# Patient Record
Sex: Female | Born: 1965 | Race: Black or African American | Hispanic: No | Marital: Single
Health system: Midwestern US, Community
[De-identification: ages and names within clinical notes are randomized; demographics above are authoritative.]

## PROBLEM LIST (undated history)

## (undated) DIAGNOSIS — J302 Other seasonal allergic rhinitis: Secondary | ICD-10-CM

## (undated) DIAGNOSIS — R7303 Prediabetes: Secondary | ICD-10-CM

## (undated) DIAGNOSIS — I10 Essential (primary) hypertension: Secondary | ICD-10-CM

## (undated) DIAGNOSIS — F419 Anxiety disorder, unspecified: Secondary | ICD-10-CM

## (undated) DIAGNOSIS — J45909 Unspecified asthma, uncomplicated: Secondary | ICD-10-CM

## (undated) DIAGNOSIS — K2970 Gastritis, unspecified, without bleeding: Secondary | ICD-10-CM

## (undated) DIAGNOSIS — K222 Esophageal obstruction: Secondary | ICD-10-CM

## (undated) DIAGNOSIS — K2270 Barrett's esophagus without dysplasia: Secondary | ICD-10-CM

## (undated) DIAGNOSIS — K859 Acute pancreatitis without necrosis or infection, unspecified: Secondary | ICD-10-CM

## (undated) DIAGNOSIS — Z9289 Personal history of other medical treatment: Secondary | ICD-10-CM

## (undated) DIAGNOSIS — C539 Malignant neoplasm of cervix uteri, unspecified: Secondary | ICD-10-CM

## (undated) DIAGNOSIS — M170 Bilateral primary osteoarthritis of knee: Secondary | ICD-10-CM

## (undated) DIAGNOSIS — G43909 Migraine, unspecified, not intractable, without status migrainosus: Secondary | ICD-10-CM

## (undated) DIAGNOSIS — F32A Depression, unspecified: Secondary | ICD-10-CM

## (undated) DIAGNOSIS — K449 Diaphragmatic hernia without obstruction or gangrene: Secondary | ICD-10-CM

## (undated) DIAGNOSIS — K209 Esophagitis, unspecified without bleeding: Secondary | ICD-10-CM

## (undated) DIAGNOSIS — K219 Gastro-esophageal reflux disease without esophagitis: Secondary | ICD-10-CM

## (undated) DIAGNOSIS — Z87442 Personal history of urinary calculi: Secondary | ICD-10-CM

## (undated) DIAGNOSIS — J189 Pneumonia, unspecified organism: Secondary | ICD-10-CM

## (undated) DIAGNOSIS — I201 Angina pectoris with documented spasm: Secondary | ICD-10-CM

## (undated) DIAGNOSIS — G4730 Sleep apnea, unspecified: Secondary | ICD-10-CM

## (undated) DIAGNOSIS — J42 Unspecified chronic bronchitis: Secondary | ICD-10-CM

## (undated) DIAGNOSIS — F329 Major depressive disorder, single episode, unspecified: Secondary | ICD-10-CM

## (undated) HISTORY — PX: NASAL SEPTUM SURGERY: SHX37

## (undated) HISTORY — PX: CYSTOSCOPY W/ STONE MANIPULATION: SHX1427

## (undated) HISTORY — DX: Unspecified asthma, uncomplicated: J45.909

## (undated) HISTORY — PX: ENDOMETRIAL ABLATION: SHX621

## (undated) HISTORY — PX: KNEE ARTHROSCOPY: SUR90

## (undated) HISTORY — PX: TUBAL LIGATION: SHX77

## (undated) HISTORY — DX: Barrett's esophagus without dysplasia: K22.70

## (undated) HISTORY — DX: Gastritis, unspecified, without bleeding: K29.70

## (undated) HISTORY — DX: Depression, unspecified: F32.A

## (undated) HISTORY — PX: DILATION AND CURETTAGE OF UTERUS: SHX78

## (undated) HISTORY — DX: Essential (primary) hypertension: I10

## (undated) HISTORY — PX: WISDOM TOOTH EXTRACTION: SHX21

## (undated) HISTORY — PX: UPPER GASTROINTESTINAL ENDOSCOPY: SHX188

## (undated) HISTORY — PX: HERNIA REPAIR: SHX51

## (undated) HISTORY — PX: APPENDECTOMY: SHX54

## (undated) HISTORY — PX: COLONOSCOPY: SHX174

## (undated) HISTORY — DX: Esophageal obstruction: K22.2

## (undated) HISTORY — PX: CARDIAC CATHETERIZATION: SHX172

## (undated) HISTORY — PX: VAGINAL HYSTERECTOMY: SUR661

## (undated) HISTORY — DX: Esophagitis, unspecified without bleeding: K20.90

## (undated) HISTORY — PX: ESOPHAGOGASTRODUODENOSCOPY (EGD) WITH ESOPHAGEAL DILATION: SHX5812

## (undated) HISTORY — DX: Gastro-esophageal reflux disease without esophagitis: K21.9

## (undated) HISTORY — PX: OTHER SURGICAL HISTORY: SHX169

## (undated) HISTORY — DX: Diaphragmatic hernia without obstruction or gangrene: K44.9

## (undated) HISTORY — DX: Major depressive disorder, single episode, unspecified: F32.9

## (undated) HISTORY — DX: Esophagitis, unspecified: K20.9

## (undated) HISTORY — PX: JOINT REPLACEMENT: SHX530

---

## 2002-04-04 HISTORY — PX: TONSILLECTOMY: SUR1361

## 2007-10-10 ENCOUNTER — Emergency Department (HOSPITAL_COMMUNITY): Admission: EM | Admit: 2007-10-10 | Discharge: 2007-10-10 | Payer: Self-pay | Admitting: Emergency Medicine

## 2007-10-10 ENCOUNTER — Encounter (INDEPENDENT_AMBULATORY_CARE_PROVIDER_SITE_OTHER): Payer: Self-pay | Admitting: *Deleted

## 2007-11-24 ENCOUNTER — Emergency Department (HOSPITAL_COMMUNITY): Admission: EM | Admit: 2007-11-24 | Discharge: 2007-11-25 | Payer: Self-pay | Admitting: Emergency Medicine

## 2007-11-25 ENCOUNTER — Encounter: Payer: Self-pay | Admitting: Internal Medicine

## 2007-11-26 ENCOUNTER — Telehealth: Payer: Self-pay | Admitting: Internal Medicine

## 2007-11-26 ENCOUNTER — Ambulatory Visit: Payer: Self-pay | Admitting: Internal Medicine

## 2007-11-26 DIAGNOSIS — R1310 Dysphagia, unspecified: Secondary | ICD-10-CM | POA: Insufficient documentation

## 2007-11-27 ENCOUNTER — Ambulatory Visit: Payer: Self-pay | Admitting: Internal Medicine

## 2007-11-27 ENCOUNTER — Encounter: Payer: Self-pay | Admitting: Internal Medicine

## 2007-11-29 ENCOUNTER — Encounter: Payer: Self-pay | Admitting: Internal Medicine

## 2008-01-09 DIAGNOSIS — Z8719 Personal history of other diseases of the digestive system: Secondary | ICD-10-CM | POA: Insufficient documentation

## 2008-01-09 DIAGNOSIS — Z87442 Personal history of urinary calculi: Secondary | ICD-10-CM | POA: Insufficient documentation

## 2008-01-09 DIAGNOSIS — I201 Angina pectoris with documented spasm: Secondary | ICD-10-CM | POA: Insufficient documentation

## 2008-01-09 DIAGNOSIS — I10 Essential (primary) hypertension: Secondary | ICD-10-CM | POA: Insufficient documentation

## 2008-01-09 DIAGNOSIS — K2270 Barrett's esophagus without dysplasia: Secondary | ICD-10-CM | POA: Insufficient documentation

## 2008-01-09 DIAGNOSIS — J45909 Unspecified asthma, uncomplicated: Secondary | ICD-10-CM | POA: Insufficient documentation

## 2008-03-17 ENCOUNTER — Emergency Department (HOSPITAL_COMMUNITY): Admission: EM | Admit: 2008-03-17 | Discharge: 2008-03-17 | Payer: Self-pay | Admitting: Family Medicine

## 2008-05-13 ENCOUNTER — Emergency Department (HOSPITAL_COMMUNITY): Admission: EM | Admit: 2008-05-13 | Discharge: 2008-05-14 | Payer: Self-pay | Admitting: Emergency Medicine

## 2008-06-18 ENCOUNTER — Emergency Department (HOSPITAL_COMMUNITY): Admission: EM | Admit: 2008-06-18 | Discharge: 2008-06-18 | Payer: Self-pay | Admitting: Emergency Medicine

## 2008-06-29 ENCOUNTER — Emergency Department (HOSPITAL_COMMUNITY): Admission: EM | Admit: 2008-06-29 | Discharge: 2008-06-29 | Payer: Self-pay | Admitting: Emergency Medicine

## 2008-09-01 ENCOUNTER — Emergency Department (HOSPITAL_COMMUNITY): Admission: EM | Admit: 2008-09-01 | Discharge: 2008-09-01 | Payer: Self-pay | Admitting: Emergency Medicine

## 2009-01-29 ENCOUNTER — Emergency Department (HOSPITAL_COMMUNITY): Admission: EM | Admit: 2009-01-29 | Discharge: 2009-01-29 | Payer: Self-pay | Admitting: Emergency Medicine

## 2009-04-22 ENCOUNTER — Emergency Department (HOSPITAL_COMMUNITY): Admission: EM | Admit: 2009-04-22 | Discharge: 2009-04-22 | Payer: Self-pay | Admitting: Family Medicine

## 2009-07-15 ENCOUNTER — Telehealth: Payer: Self-pay | Admitting: Internal Medicine

## 2009-07-16 ENCOUNTER — Encounter (INDEPENDENT_AMBULATORY_CARE_PROVIDER_SITE_OTHER): Payer: Self-pay | Admitting: *Deleted

## 2009-07-17 ENCOUNTER — Ambulatory Visit: Payer: Self-pay | Admitting: Internal Medicine

## 2009-07-21 ENCOUNTER — Ambulatory Visit: Payer: Self-pay | Admitting: Internal Medicine

## 2009-08-03 ENCOUNTER — Telehealth: Payer: Self-pay | Admitting: Internal Medicine

## 2009-08-19 ENCOUNTER — Telehealth: Payer: Self-pay | Admitting: Internal Medicine

## 2009-08-25 ENCOUNTER — Ambulatory Visit (HOSPITAL_COMMUNITY): Admission: RE | Admit: 2009-08-25 | Discharge: 2009-08-25 | Payer: Self-pay | Admitting: Internal Medicine

## 2009-11-04 ENCOUNTER — Emergency Department (HOSPITAL_COMMUNITY): Admission: EM | Admit: 2009-11-04 | Discharge: 2009-11-04 | Payer: Self-pay | Admitting: Emergency Medicine

## 2009-12-12 ENCOUNTER — Emergency Department (HOSPITAL_COMMUNITY): Admission: EM | Admit: 2009-12-12 | Discharge: 2009-12-12 | Payer: Self-pay | Admitting: Emergency Medicine

## 2010-02-05 ENCOUNTER — Ambulatory Visit: Payer: Self-pay | Admitting: General Practice

## 2010-02-08 ENCOUNTER — Emergency Department (HOSPITAL_COMMUNITY): Admission: EM | Admit: 2010-02-08 | Discharge: 2010-02-08 | Payer: Self-pay | Admitting: Emergency Medicine

## 2010-05-04 NOTE — Letter (Signed)
Summary: EGD Instructions  [facility]  [address] [place], [place] [address]   Phone: [phone]  Fax: [phone]       Leslie Martinez    04-Sep-1965    MRN: [id number]       Procedure Day /Date:  Tuesday 07/21/2009     Arrival Time: 10:00 am     Procedure Time: 1100 am     Location of Procedure:                    _x  _ [facility] (4th Floor)    PREPARATION FOR ENDOSCOPY   On Tuesday 4/19 THE DAY OF THE PROCEDURE:  1.   No solid foods, milk or milk products are allowed after midnight the night before your procedure.  2.   Do not drink anything colored red or purple.  Avoid juices with pulp.  No orange juice.  3.  You may drink clear liquids until 9:00 am, which is 2 hours before your procedure.                                                                                                CLEAR LIQUIDS INCLUDE: Water Jello Ice Popsicles Tea (sugar ok, no milk/cream) Powdered fruit flavored drinks Coffee (sugar ok, no milk/cream) Gatorade Juice: apple, white grape, white cranberry  Lemonade Clear bullion, consomm, broth Carbonated beverages (any kind) Strained chicken noodle soup Hard Candy   MEDICATION INSTRUCTIONS  Unless otherwise instructed, you should take regular prescription medications with a small sip of water as early as possible the morning of your procedure.      Additional medication instructions:  hold HCTZ morning of procedure.             OTHER INSTRUCTIONS  You will need a responsible adult at least 46 years of age to accompany you and drive you home.   This person must remain in the waiting room during your procedure.  Wear loose fitting clothing that is easily removed.  Leave jewelry and other valuables at home.  However, you may wish to bring a book to read or an iPod/MP3 player to listen to music as you wait for your procedure to start.  Remove all body piercing jewelry and leave at home.  Total  time from sign-in until discharge is approximately 2-3 hours.  You should go home directly after your procedure and rest.  You can resume normal activities the day after your procedure.  The day of your procedure you should not:   Drive   Make legal decisions   Operate machinery   Drink alcohol   Return to work  You will receive specific instructions about eating, activities and medications before you leave.    The above instructions have been reviewed and explained to me by  Ezra Sites RN  July 17, 2009 9:03 AM      I fully understand and can verbalize these instructions _____________________________ Date _________

## 2010-05-04 NOTE — Procedures (Signed)
Summary: Upper Endoscopy  Patient: [name] Note: All result statuses are Final unless otherwise noted.  Tests: (1) Upper Endoscopy (EGD)   EGD Upper Endoscopy       DONE     [facility]     [address]     [place], [place]  [address]           ENDOSCOPY PROCEDURE REPORT           PATIENT:  Leslie Martinez, Leslie Martinez  MR#:  [id number]     BIRTHDATE:  [birth year], 43 yrs. old  GENDER:  female           ENDOSCOPIST:  Hedwig Morton. Juanda Chance, MD     Referred by:           PROCEDURE DATE:  07/21/2009     PROCEDURE:  EGD with dilatation over guidewire     ASA CLASS:  Class II     INDICATIONS:  dysphagia hx of benign es. stricture, s/p dil x 4,     last one 2009, takes Omeprazole 20 mg po bid           MEDICATIONS:   Versed 10 mg, Fentanyl 50 mcg     TOPICAL ANESTHETIC:  Exactacain Spray           DESCRIPTION OF PROCEDURE:   After the risks benefits and     alternatives of the procedure were thoroughly explained, informed     consent was obtained.  The LB GIF-H180 [id number] endoscope was     introduced through the mouth and advanced to the second portion of     the duodenum, without limitations.  The instrument was slowly     withdrawn as the mucosa was fully examined.     <<PROCEDUREIMAGES>>           A stricture was found in the distal esophagus. 13 mm benign     appearing es (see image1).stricture Savary dilation over a     guidewire 13,14,15,16,17 mm dilators passed over the guidewire     Mild gastritis was found (see image2).  A hiatal hernia was found     (see image4). 4 cm hiatal hernia, 34-30 cm  Otherwise the     examination was normal (see image3).    Retroflexed views revealed     no abnormalities.    The scope was then withdrawn from the patient     and the procedure completed.           COMPLICATIONS:  None           ENDOSCOPIC IMPRESSION:     1) Stricture in the distal esophagus     2) Mild gastritis     3) Hiatal hernia     4) Otherwise normal examination     s/p  dilation to 17 mm     RECOMMENDATIONS:     1) Anti-reflux regimen to be follow     prelosec 20 mg po bid           REPEAT EXAM:  In 0 year(s) for.  recall prn           ______________________________     Hedwig Morton. Juanda Chance, MD           CC:           n.     eSIGNED:   Hedwig Morton. Albany Winslow at 07/21/2009 11:57 AM           Leslie Martinez, [id number]  Note: An exclamation mark (!) indicates a result that was not dispersed into the flowsheet. Document Creation Date: 07/21/2009 11:58 AM _______________________________________________________________________  (1) Order result status: Final Collection or observation date-time: 07/21/2009 11:48 Requested date-time:  Receipt date-time:  Reported date-time:  Referring Physician:   Ordering Physician: [name] [phone]) Specimen Source:  Source: Launa Grill Order Number: [id number] Lab site:

## 2010-05-04 NOTE — Progress Notes (Signed)
Summary: TRIAGE--SEVERE DYSPHAGIA  Phone Note Call from Patient Call back at [facility] [phone]   Caller: Patient  [phone] Call For: Dr. Juanda Chance Reason for Call: Talk to Nurse Summary of Call: dysphagia... doesnt feel she can wait until next available. Initial call taken by: Vallarie Mare,  July 15, 2009 8:48 AM  Follow-up for Phone Call        (Endo/Dil. 11-27-2007, No show for appt. 01-15-2008.)  Pt. calling with C/O severe dysphagia, is on soft foods only. Takes Prilosec daily.  DR.BRODIE PLEASE ADVISE  Follow-up by: Laureen Ochs LPN,  July 15, 2009 11:01 AM  Additional Follow-up for Phone Call Additional follow up Details #1::        please schedule direct EGD/dil, ASAP, increase PPI to two times a day., last EGD/dil in 2008, had 13 mm stricture Additional Follow-up by: Hart Carwin MD,  July 15, 2009 11:08 PM    Additional Follow-up for Phone Call Additional follow up Details #2::    Message left for patient to callback. Laureen Ochs LPN  July 16, 2009 8:37 AM   Above MD orders reviewed with patient. Pt. is scheduled for a previsit on 07-17-09 at 8:30am and Endo/Dil. in [facility] on 07-21-09 at 11am. Pt. instructed to call back as needed.  Follow-up by: Laureen Ochs LPN,  July 16, 2009 9:41 AM

## 2010-05-04 NOTE — Progress Notes (Signed)
Summary: triage  Phone Note Call from Patient Call back at Home Phone [phone]   Caller: Patient Call For: Juanda Chance Reason for Call: Talk to Nurse Summary of Call: Patient states that she is having a lot of problems with her food , states that she continues to choke on it. Initial call taken by: Tawni Levy,  Aug 19, 2009 4:48 PM  Follow-up for Phone Call        Pt had egd with dilatation a few weeks ago.  Pt feels this did not work as well as it has in the past.  She continues to have problems with food getting hung.  A few days ago was eating fish and got choked and had to excuse herself and throw up. Taking omeprazole two times a day as instructed.   Follow-up by: Ashok Cordia RN,  Aug 19, 2009 5:01 PM  Additional Follow-up for Phone Call Additional follow up Details #1::        I have left a long message on pt's phone  concerning failure to respond to dilation. She may have a dismotility.  Please schedule Barium esophagram " dysphagia, no improvement after dilation, r/o dismotility" Additional Follow-up by: Hart Carwin MD,  Aug 20, 2009 7:58 AM    Additional Follow-up for Phone Call Additional follow up Details #2::    Barium Esophagram sch for Tuesday, 08/25/09 arrive at 9:15 at [facility].    LM for pt to call. Lupita Leash Surface RN  Aug 20, 2009 8:18 AM   Pt notified of appt. Follow-up by: Ashok Cordia RN,  Aug 20, 2009 12:41 PM

## 2010-05-04 NOTE — Progress Notes (Signed)
Summary: results  Phone Note Call from Patient Call back at Home Phone [phone]   Caller: Patient Call For: Dr. Juanda Chance Reason for Call: Talk to Nurse Summary of Call: would like EGD results Initial call taken by: Vallarie Mare,  Aug 03, 2009 3:39 PM  Follow-up for Phone Call        Patient  wanted to know if Dr Juanda Chance took a bx during her EGD.  I did advise the patient no bx taken.  all questions answered. Follow-up by: Darcey Nora RN, CGRN,  Aug 03, 2009 4:14 PM

## 2010-05-04 NOTE — Miscellaneous (Signed)
Summary: [facility]  Clinical Lists Changes  Allergies: Added new allergy or adverse reaction of * SHELLFISH Added new allergy or adverse reaction of PERCOCET

## 2010-06-15 LAB — POCT CARDIAC MARKERS
CKMB, poc: 1.2 ng/mL (ref 1.0–8.0)
Myoglobin, poc: 46.9 ng/mL (ref 12–200)
Troponin i, poc: <0.05 ng/mL (ref 0.00–0.09)

## 2010-06-15 LAB — POCT I-STAT, CHEM 8
BUN: 9 mg/dL (ref 6–23)
Calcium, Ion: 1.21 mmol/L (ref 1.12–1.32)
Chloride: 105 mEq/L (ref 96–112)
HCT: 40 % (ref 36.0–46.0)
Potassium: 3.7 mEq/L (ref 3.5–5.1)
Sodium: 140 mEq/L (ref 135–145)

## 2010-06-18 LAB — COMPREHENSIVE METABOLIC PANEL
ALT: 20 U/L (ref 0–35)
Albumin: 3.9 g/dL (ref 3.5–5.2)
Alkaline Phosphatase: 69 U/L (ref 39–117)
BUN: 6 mg/dL (ref 6–23)
Chloride: 103 mEq/L (ref 96–112)
Glucose, Bld: 130 mg/dL — ABNORMAL HIGH (ref 70–99)
Potassium: 3.4 mEq/L — ABNORMAL LOW (ref 3.5–5.1)
Sodium: 137 mEq/L (ref 135–145)
Total Bilirubin: 0.6 mg/dL (ref 0.3–1.2)
Total Protein: 6.8 g/dL (ref 6.0–8.3)

## 2010-06-18 LAB — BRAIN NATRIURETIC PEPTIDE: Pro B Natriuretic peptide (BNP): <30 pg/mL (ref 0.0–100.0)

## 2010-06-18 LAB — DIFFERENTIAL
Basophils Absolute: 0 10*3/uL (ref 0.0–0.1)
Basophils Relative: 1 % (ref 0–1)
Eosinophils Absolute: 0.1 10*3/uL (ref 0.0–0.7)
Monocytes Absolute: 0.4 10*3/uL (ref 0.1–1.0)
Monocytes Relative: 7 % (ref 3–12)
Neutrophils Relative %: 40 % — ABNORMAL LOW (ref 43–77)

## 2010-06-18 LAB — CBC
HCT: 34.6 % — ABNORMAL LOW (ref 36.0–46.0)
MCV: 84 fL (ref 78.0–100.0)
RBC: 4.12 MIL/uL (ref 3.87–5.11)
RDW: 13.1 % (ref 11.5–15.5)
WBC: 5.7 10*3/uL (ref 4.0–10.5)

## 2010-06-18 LAB — URINALYSIS, ROUTINE W REFLEX MICROSCOPIC
Bilirubin Urine: NEGATIVE
Ketones, ur: NEGATIVE mg/dL
Nitrite: NEGATIVE
Protein, ur: NEGATIVE mg/dL
Urobilinogen, UA: 0.2 mg/dL (ref 0.0–1.0)

## 2010-06-18 LAB — URINE MICROSCOPIC-ADD ON

## 2010-07-13 LAB — BASIC METABOLIC PANEL
BUN: 12 mg/dL (ref 6–23)
Calcium: 9.1 mg/dL (ref 8.4–10.5)
Creatinine, Ser: 0.71 mg/dL (ref 0.4–1.2)
GFR calc non Af Amer: >60 mL/min (ref >60)
Glucose, Bld: 120 mg/dL — ABNORMAL HIGH (ref 70–99)

## 2010-07-13 LAB — CBC
Platelets: 150 10*3/uL (ref 150–400)
RDW: 13 % (ref 11.5–15.5)
WBC: 4 10*3/uL (ref 4.0–10.5)

## 2010-07-13 LAB — POCT CARDIAC MARKERS
CKMB, poc: 1.4 ng/mL (ref 1.0–8.0)
Myoglobin, poc: 38.8 ng/mL (ref 12–200)
Myoglobin, poc: 77 ng/mL (ref 12–200)

## 2010-07-13 LAB — MAGNESIUM: Magnesium: 2.1 mg/dL (ref 1.5–2.5)

## 2010-07-15 LAB — URINE MICROSCOPIC-ADD ON

## 2010-07-15 LAB — DIFFERENTIAL
Basophils Absolute: 0 10*3/uL (ref 0.0–0.1)
Eosinophils Relative: 1 % (ref 0–5)
Lymphocytes Relative: 36 % (ref 12–46)
Monocytes Absolute: 0.6 10*3/uL (ref 0.1–1.0)

## 2010-07-15 LAB — BASIC METABOLIC PANEL
CO2: 28 mEq/L (ref 19–32)
GFR calc non Af Amer: >60 mL/min (ref >60)
Glucose, Bld: 96 mg/dL (ref 70–99)
Potassium: 3.5 mEq/L (ref 3.5–5.1)
Sodium: 140 mEq/L (ref 135–145)

## 2010-07-15 LAB — CBC
HCT: 34.9 % — ABNORMAL LOW (ref 36.0–46.0)
Hemoglobin: 11.6 g/dL — ABNORMAL LOW (ref 12.0–15.0)
MCHC: 33.1 g/dL (ref 30.0–36.0)
RDW: 13.1 % (ref 11.5–15.5)

## 2010-07-15 LAB — URINALYSIS, ROUTINE W REFLEX MICROSCOPIC
Glucose, UA: NEGATIVE mg/dL
Specific Gravity, Urine: 1.025 (ref 1.005–1.030)

## 2010-07-20 LAB — DIFFERENTIAL
Basophils Absolute: 0 10*3/uL (ref 0.0–0.1)
Basophils Relative: 1 % (ref 0–1)
Eosinophils Absolute: 0.1 10*3/uL (ref 0.0–0.7)
Eosinophils Relative: 1 % (ref 0–5)
Monocytes Absolute: 0.5 10*3/uL (ref 0.1–1.0)
Neutro Abs: 4.1 10*3/uL (ref 1.7–7.7)

## 2010-07-20 LAB — CBC
Hemoglobin: 12.5 g/dL (ref 12.0–15.0)
MCHC: 33.5 g/dL (ref 30.0–36.0)
MCV: 84.8 fL (ref 78.0–100.0)
RDW: 12.9 % (ref 11.5–15.5)

## 2010-07-20 LAB — POCT I-STAT, CHEM 8
Calcium, Ion: 1.24 mmol/L (ref 1.12–1.32)
Chloride: 104 mEq/L (ref 96–112)
HCT: 40 % (ref 36.0–46.0)
Hemoglobin: 13.6 g/dL (ref 12.0–15.0)
TCO2: 25 mmol/L (ref 0–100)

## 2010-07-20 LAB — POCT CARDIAC MARKERS

## 2010-08-17 NOTE — Consult Note (Signed)
NAMEGILMA, BESSETTE NO.:  [id number]   MEDICAL RECORD NO.:  [id number]          PATIENT TYPE:  EMS   LOCATION:  [facility]                         FACILITY:  [facility]   PHYSICIAN:  Vania Rea, M.D. DATE OF BIRTH:  [birth year]   DATE OF CONSULTATION:  11/25/2007  DATE OF DISCHARGE:                                 CONSULTATION   EMERGENCY ROOM CONSULTATION   PHYSICIAN REQUESTING CONSULTATION:  Sunnie Nielsen, MD.   REASON FOR CONSULTATION:  Acute dysphagia.   IMPRESSION:  1. 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  2. History of Barrett esophagus.  3. Hypertension, uncontrolled.  4. History of Prinzmetal angina.  5. History of asthma.   RECOMMENDATIONS:  1. Give a dose of Procardia-XL 30 mg p.o. now, increase Prilosec to 40      mg p.o. daily.  2. Discontinue meloxicam.  3. Follow up with GI physician, Dr. [name], on Monday morning.  4. Follow up with primary care physician in the coming week.   HISTORY OF PRESENT ILLNESS:  This is a 45 year old [place] American  lady, who recently relocated from [place], [place], and has  not yet established herself with a new PCP.  She does have a history of  what she reports as Barrett esophagus for which she takes Prilosec 20 mg  daily and has a history of recurrent dysphagia to solids, which is  treated by her gastroenterologist with urgent dilation whenever this  occurs.  Patient is also hypertensive and says her blood pressure is  usually controlled with Toprol-XL 20 mg daily.  Patient reports that for  about 3 days now, she has been having dysphagia, which has been  progressing until yesterday evening when she could not swallow anything  solid at all.  She recognized the symptoms and came to the emergency  room hoping she could arrange to have upper endoscopy and dilation.  She  has been having no chest discomfort but did have jaw discomfort relieved  by nitroglycerin.  She is  currently in no distress.  On arrival to the  emergency room, the patient was also noted to have a blood pressure  elevated above normal, although she insists she has been taking her  medication regularly.   Of note, patient recently saw an orthopedic surgeon, who was apparently  unaware that she had Barrett esophagus and has been treating her with  meloxicam.   Patient denies any nausea or vomiting.  She denies any bloody or melena  stools.  She denies any syncope or fainting.  She denies any headache or  chest pressure.   PAST MEDICAL HISTORY:  1. Barrett esophagus.  2. History of kidney stones.  3. Obesity.  4. Prinzmetal angina.  5. Asthma.   MEDICATIONS:  Include:  1. Toprol-XL 25 mg daily.  2. Prilosec 20 mg daily.  3. Nitroglycerin p.r.n.  4. Lasix unknown dose daily.  5. Meloxicam unknown dose daily.  6. Foradil 1 puff twice daily.   ALLERGIES:  COMPAZINE.   SOCIAL HISTORY:  Denies tobacco, alcohol, or illicit drug use.  FAMILY HISTORY:  Significant for an MI at age 73, a father with  diabetes, and strokes also run in her family.   REVIEW OF SYSTEMS:  Other than noted above and nil significant.   PHYSICAL EXAMINATION:  GENERAL:  Obese, young, African American lady  reclining in bed in no distress.  VITAL SIGNS:  Initial blood pressure was 152/102, also 170/100.  With  nitro paste, it has come down to 155/88.  HEENT:  Her pupils are round and equal.  Mucous membranes are pink and  anicteric.  NECK:  She has no cervical lymphadenopathy.  CHEST:  Clear to auscultation bilaterally.  CARDIOVASCULAR:  Regular rate and rhythm without murmur.  ABDOMEN:  Obese, soft and nontender.  EXTREMITIES:  Without edema.   LABORATORY DATA:  Her sodium is 139, potassium 3.9, chloride 107, BUN  11, creatinine 1.1, glucose 114.  Her hemoglobin is 11.7, white count  6.5, platelets 154.  Urinalysis shows a small amount of leukocyte-  esterase, a specific gravity of 1.027.  Chest  x-ray shows no acute  abnormality.      Vania Rea, M.D.  Electronically Signed     LC/MEDQ  D:  11/25/2007  T:  11/25/2007  Job:  [id number]   cc:   Hedwig Morton. Juanda Chance, MD

## 2010-09-29 ENCOUNTER — Observation Stay: Payer: Self-pay | Admitting: *Deleted

## 2010-11-09 ENCOUNTER — Ambulatory Visit: Payer: Self-pay | Admitting: General Practice

## 2010-11-24 ENCOUNTER — Telehealth: Payer: Self-pay | Admitting: Internal Medicine

## 2010-11-24 NOTE — Telephone Encounter (Signed)
She is experiencing tickling in her throat with  Gagging at times. Episodic and not related to eating. She is also complaining of increasing intermittent dysphagia and feels like she may need another dilation - last done in 2011 (April) for esophageal ring. I told her I would forward this info to Dr. Juanda Chance for review and recommendations.

## 2010-11-24 NOTE — Telephone Encounter (Signed)
Please see Dr Marvell Fuller note, I have reviewed her record, she ought to be still on Prilosec 20 mg bid. Also You may add Carafate slurry 10cc po bid,#10 oz,. OK to schedule EGD/dil directly, she had a significant stricture 07/2009.Marland Kitchen

## 2010-11-25 NOTE — Telephone Encounter (Signed)
Patient's voice mail box is not set up. Left a message for patient to call me at her work number.

## 2010-11-26 NOTE — Telephone Encounter (Signed)
Unable to reach patient at cell number. Left a message at work again.

## 2010-11-29 ENCOUNTER — Encounter: Payer: Self-pay | Admitting: *Deleted

## 2010-11-29 NOTE — Telephone Encounter (Signed)
Unable to reach patient at home or work number. Sent a letter to patient requesting she call us.

## 2010-11-29 NOTE — Telephone Encounter (Signed)
Unable to reach patient at her home/cell number or at work. Letter mailed to patient requesting she call us.

## 2010-12-27 ENCOUNTER — Other Ambulatory Visit: Payer: Self-pay | Admitting: Internal Medicine

## 2010-12-27 ENCOUNTER — Telehealth: Payer: Self-pay | Admitting: Internal Medicine

## 2010-12-27 NOTE — Telephone Encounter (Signed)
Pt scheduled for egd with dil 01/06/11@3 :30pm. Pt mailed the prep instructions and signature packet. Pt aware of appt date and time.

## 2010-12-30 LAB — POCT URINALYSIS DIP (DEVICE)
Protein, ur: NEGATIVE
Specific Gravity, Urine: 1.02
pH: 5.5

## 2011-01-03 DIAGNOSIS — K2270 Barrett's esophagus without dysplasia: Secondary | ICD-10-CM

## 2011-01-03 HISTORY — DX: Barrett's esophagus without dysplasia: K22.70

## 2011-01-06 ENCOUNTER — Ambulatory Visit (AMBULATORY_SURGERY_CENTER): Payer: BC Managed Care – PPO | Admitting: Internal Medicine

## 2011-01-06 ENCOUNTER — Encounter: Payer: Self-pay | Admitting: Internal Medicine

## 2011-01-06 DIAGNOSIS — K319 Disease of stomach and duodenum, unspecified: Secondary | ICD-10-CM

## 2011-01-06 DIAGNOSIS — K2960 Other gastritis without bleeding: Secondary | ICD-10-CM

## 2011-01-06 DIAGNOSIS — R1310 Dysphagia, unspecified: Secondary | ICD-10-CM

## 2011-01-06 DIAGNOSIS — K222 Esophageal obstruction: Secondary | ICD-10-CM

## 2011-01-06 DIAGNOSIS — D1330 Benign neoplasm of unspecified part of small intestine: Secondary | ICD-10-CM

## 2011-01-06 MED ORDER — ESOMEPRAZOLE MAGNESIUM 40 MG PO CPDR: 40.0000 mg | DELAYED_RELEASE_CAPSULE | Freq: Two times a day (BID) | ORAL | Status: DC

## 2011-01-06 MED ORDER — SODIUM CHLORIDE 0.9 % IV SOLN: 500.0000 mL | INTRAVENOUS | Status: DC

## 2011-01-06 NOTE — Patient Instructions (Signed)
FOLLOW DISCHARGE INSTRUCTIONS (BLUE & GREEN SHEETS).  INFORMATION ON GASTRITIS GIVEN TO YOU  FOLLOW DILATATION DIET GIVEN TO YOU TODAY TO PROTECT YOUR ESOPHAGUS SINCE IT WAS DILATED TODAY  SWITCH TO NEXIUM 40 MG BY MOUTH TWICE DAILY  GAVISCON 1-2 TABS BY MOUTH BEFORE MEALS & AS NEEDED.

## 2011-01-07 ENCOUNTER — Telehealth: Payer: Self-pay | Admitting: *Deleted

## 2011-01-07 NOTE — Telephone Encounter (Signed)

## 2011-01-12 ENCOUNTER — Encounter: Payer: Self-pay | Admitting: Internal Medicine

## 2011-01-17 ENCOUNTER — Telehealth: Payer: Self-pay | Admitting: Internal Medicine

## 2011-01-17 NOTE — Telephone Encounter (Signed)
Called and left a message for patient with the details in the letter mailed to patient with results on 01/14/11.

## 2011-07-21 NOTE — ED Notes (Signed)
Pt c/io leg and breast swelling and pain for past few days.

## 2011-07-21 NOTE — ED Notes (Signed)
Urine cup provided.

## 2011-07-21 NOTE — ED Provider Notes (Signed)
Patient is a 46 y.o. female presenting with leg pain, inability to urinate, and breast pain. The history is provided by the patient (ankle swelling and some breast swelling and  tenderness.).   Leg Pain   This is a new problem.   Urinary Retention     Breast pain          Past Medical History   Diagnosis Date   ??? Hypertension    ??? GERD (gastroesophageal reflux disease)    ??? Arthritis    ??? Asthma    ??? Anginal pain         Past Surgical History   Procedure Date   ??? Hx heart catheterization    ??? Hx hysterectomy    ??? Hx gyn      endometrial oblation   ??? Hx polypectomy      bladder neck   ??? Hx knee arthroscopy          No family history on file.     History     Social History   ??? Marital Status: SINGLE     Spouse Name: N/A     Number of Children: N/A   ??? Years of Education: N/A     Occupational History   ??? Not on file.     Social History Main Topics   ??? Smoking status: Never Smoker    ??? Smokeless tobacco: Not on file   ??? Alcohol Use: Yes      occasional   ??? Drug Use: No   ??? Sexually Active:      Other Topics Concern   ??? Not on file     Social History Narrative   ??? No narrative on file                  ALLERGIES: Cephalexin; Compazine; Iodine; and Percocet      Review of Systems   All other systems reviewed and are negative.        Filed Vitals:    07/21/11 2034   BP: 155/100   Pulse: 88   Temp: 98.3 ??F (36.8 ??C)   Resp: 16   Height: 5\' 9"  (1.753 m)   Weight: 108.863 kg (240 lb)   SpO2: 99%            Physical Exam   Nursing note and vitals reviewed.  Constitutional: She is oriented to person, place, and time. She appears well-developed and well-nourished.   HENT:   Head: Normocephalic and atraumatic.   Eyes: Conjunctivae and EOM are normal. Pupils are equal, round, and reactive to light.   Neck: Normal range of motion. Neck supple.   Cardiovascular: Normal rate, regular rhythm and normal heart sounds.    Pulmonary/Chest: Effort normal and breath sounds normal.   Abdominal: Soft. Bowel sounds are normal.    Musculoskeletal: Normal range of motion. She exhibits edema.        Ankle edema, no calf tenderness on dorsiflexion   Neurological: She is alert and oriented to person, place, and time. She has normal reflexes.   Skin: Skin is warm.   Psychiatric: She has a normal mood and affect.        MDM    Procedures

## 2011-07-21 NOTE — Other (Signed)
Discussed with the patient and all questioned fully answered. She will call me if any problems arise.  Advised to reduce salt in diet.

## 2011-07-22 LAB — CBC WITH AUTOMATED DIFF
ABS. BASOPHILS: 0 10*3/uL (ref 0.0–0.1)
ABS. EOSINOPHILS: 0.1 10*3/uL (ref 0.0–0.4)
ABS. LYMPHOCYTES: 2 10*3/uL (ref 0.8–3.5)
ABS. MONOCYTES: 0.3 10*3/uL (ref 0.0–1.0)
ABS. NEUTROPHILS: 3.3 10*3/uL (ref 1.8–8.0)
BASOPHILS: 0 % (ref 0–1)
EOSINOPHILS: 2 % (ref 0–7)
HCT: 32.2 % — ABNORMAL LOW (ref 35.0–47.0)
HGB: 10.6 g/dL — ABNORMAL LOW (ref 11.5–16.0)
LYMPHOCYTES: 34 % (ref 12–49)
MCH: 27.2 PG (ref 26.0–34.0)
MCHC: 32.9 g/dL (ref 30.0–36.5)
MCV: 82.6 FL (ref 80.0–99.0)
MONOCYTES: 6 % (ref 5–13)
NEUTROPHILS: 58 % (ref 32–75)
PLATELET: 173 10*3/uL (ref 150–400)
RBC: 3.9 M/uL (ref 3.80–5.20)
RDW: 13.4 % (ref 11.5–14.5)
WBC: 5.8 10*3/uL (ref 3.6–11.0)

## 2011-07-22 LAB — URINALYSIS W/ REFLEX CULTURE
Bacteria: NEGATIVE /HPF
Bilirubin: NEGATIVE
Blood: NEGATIVE
Glucose: NEGATIVE MG/DL
Ketone: NEGATIVE MG/DL
Leukocyte Esterase: NEGATIVE
Nitrites: NEGATIVE
Protein: NEGATIVE MG/DL
Specific gravity: 1.019 (ref 1.003–1.030)
Urobilinogen: 0.2 EU/DL (ref 0.2–1.0)
pH (UA): 6.5 (ref 5.0–8.0)

## 2011-07-22 LAB — POC CHEM8
Anion gap (POC): 15 mmol/L (ref 5–15)
BUN (POC): 4 MG/DL — ABNORMAL LOW (ref 9–20)
CO2 (POC): 24 MMOL/L (ref 21–32)
Calcium, ionized (POC): 1.16 MMOL/L (ref 1.12–1.32)
Chloride (POC): 105 MMOL/L (ref 98–107)
Creatinine (POC): 0.6 MG/DL (ref 0.6–1.3)
GFRAA, POC: >60 mL/min/{1.73_m2} (ref >60)
GFRNA, POC: >60 mL/min/{1.73_m2} (ref >60)
Glucose (POC): 114 MG/DL — ABNORMAL HIGH (ref 65–105)
Hematocrit (POC): 31 % — ABNORMAL LOW (ref 35.0–47.0)
Hemoglobin (POC): 10.5 GM/DL — ABNORMAL LOW (ref 11.5–16.0)
Potassium (POC): 3.6 MMOL/L (ref 3.5–5.1)
Sodium (POC): 140 MMOL/L (ref 136–145)

## 2011-08-31 ENCOUNTER — Telehealth: Payer: Self-pay | Admitting: Internal Medicine

## 2011-08-31 NOTE — Telephone Encounter (Signed)
Left a message for patient to call me. 

## 2011-09-01 NOTE — Telephone Encounter (Signed)
She needs to be set up for direct EGD/dil. I will consider Nissen Fundoplication. Cintinue PPI bid

## 2011-09-01 NOTE — Telephone Encounter (Signed)
Spoke with patient and scheduled her for direct EGD with dil on 10/19/11 at 2:30 PM with 1:30 PM arrival. Previsit on 09/02/11 at 3:30 PM.

## 2011-09-01 NOTE — Telephone Encounter (Signed)
Patient calling to report increased difficulty swallowing meats, rice and bread for the last month. She feels it is getting worse. She is taking Nexium BID. Hx Barrett,s esophagus, esophageal stricture. Last EGD 01/06/2011. Please, advise.

## 2011-09-02 ENCOUNTER — Telehealth: Payer: Self-pay | Admitting: *Deleted

## 2011-09-02 NOTE — Telephone Encounter (Signed)
Home number no longer pts. Number.  Spoke with pt on her mobile phone and [facility] her previsit to 09/09/11

## 2011-09-09 ENCOUNTER — Ambulatory Visit (AMBULATORY_SURGERY_CENTER): Payer: BC Managed Care – PPO | Admitting: *Deleted

## 2011-09-09 ENCOUNTER — Encounter: Payer: Self-pay | Admitting: Internal Medicine

## 2011-09-09 VITALS — Ht 69.0 in | Wt 260.0 lb

## 2011-09-09 DIAGNOSIS — R1310 Dysphagia, unspecified: Secondary | ICD-10-CM

## 2011-10-19 ENCOUNTER — Encounter: Payer: BC Managed Care – PPO | Admitting: Internal Medicine

## 2011-11-02 ENCOUNTER — Encounter (HOSPITAL_COMMUNITY): Payer: Self-pay | Admitting: Family Medicine

## 2011-11-02 ENCOUNTER — Emergency Department (HOSPITAL_COMMUNITY): Payer: BC Managed Care – PPO

## 2011-11-02 ENCOUNTER — Emergency Department (HOSPITAL_COMMUNITY)
Admission: EM | Admit: 2011-11-02 | Discharge: 2011-11-02 | Disposition: A | Discharge status: Home / Self Care | Payer: BC Managed Care – PPO | Attending: Emergency Medicine | Admitting: Emergency Medicine

## 2011-11-02 ENCOUNTER — Encounter: Payer: Self-pay | Admitting: Internal Medicine

## 2011-11-02 ENCOUNTER — Ambulatory Visit (AMBULATORY_SURGERY_CENTER): Payer: BC Managed Care – PPO | Admitting: Internal Medicine

## 2011-11-02 ENCOUNTER — Telehealth: Payer: Self-pay | Admitting: Internal Medicine

## 2011-11-02 VITALS — BP 130/73 | HR 63 | Temp 97.9°F | Resp 16 | Ht 69.0 in | Wt 206.0 lb

## 2011-11-02 DIAGNOSIS — R0602 Shortness of breath: Secondary | ICD-10-CM | POA: Insufficient documentation

## 2011-11-02 DIAGNOSIS — K2960 Other gastritis without bleeding: Secondary | ICD-10-CM

## 2011-11-02 DIAGNOSIS — K222 Esophageal obstruction: Secondary | ICD-10-CM

## 2011-11-02 DIAGNOSIS — R509 Fever, unspecified: Secondary | ICD-10-CM

## 2011-11-02 DIAGNOSIS — K219 Gastro-esophageal reflux disease without esophagitis: Secondary | ICD-10-CM

## 2011-11-02 DIAGNOSIS — K2270 Barrett's esophagus without dysplasia: Secondary | ICD-10-CM

## 2011-11-02 DIAGNOSIS — K2100 Gastro-esophageal reflux disease with esophagitis, without bleeding: Secondary | ICD-10-CM

## 2011-11-02 DIAGNOSIS — R0789 Other chest pain: Secondary | ICD-10-CM | POA: Insufficient documentation

## 2011-11-02 DIAGNOSIS — R1310 Dysphagia, unspecified: Secondary | ICD-10-CM

## 2011-11-02 DIAGNOSIS — R059 Cough, unspecified: Secondary | ICD-10-CM | POA: Insufficient documentation

## 2011-11-02 DIAGNOSIS — R05 Cough: Secondary | ICD-10-CM | POA: Insufficient documentation

## 2011-11-02 LAB — CBC WITH DIFFERENTIAL/PLATELET
Basophils Absolute: 0 10*3/uL (ref 0.0–0.1)
Basophils Relative: 0 % (ref 0–1)
Eosinophils Absolute: 0 10*3/uL (ref 0.0–0.7)
Eosinophils Relative: 0 % (ref 0–5)
HCT: 39.7 % (ref 36.0–46.0)
Hemoglobin: 13.3 g/dL (ref 12.0–15.0)
Lymphocytes Relative: 11 % — ABNORMAL LOW (ref 12–46)
Lymphs Abs: 1.1 10*3/uL (ref 0.7–4.0)
MCH: 27.4 pg (ref 26.0–34.0)
MCHC: 33.5 g/dL (ref 30.0–36.0)
MCV: 81.7 fL (ref 78.0–100.0)
Monocytes Absolute: 0.3 10*3/uL (ref 0.1–1.0)
Monocytes Relative: 3 % (ref 3–12)
Neutro Abs: 8.6 10*3/uL — ABNORMAL HIGH (ref 1.7–7.7)
Neutrophils Relative %: 86 % — ABNORMAL HIGH (ref 43–77)
Platelets: 169 10*3/uL (ref 150–400)
RBC: 4.86 MIL/uL (ref 3.87–5.11)
RDW: 13.4 % (ref 11.5–15.5)
WBC: 10.1 10*3/uL (ref 4.0–10.5)

## 2011-11-02 LAB — URINE MICROSCOPIC-ADD ON

## 2011-11-02 LAB — URINALYSIS, ROUTINE W REFLEX MICROSCOPIC
Bilirubin Urine: NEGATIVE
Glucose, UA: NEGATIVE mg/dL
Ketones, ur: NEGATIVE mg/dL
Leukocytes, UA: NEGATIVE
Nitrite: NEGATIVE
Protein, ur: NEGATIVE mg/dL
Specific Gravity, Urine: 1.016 (ref 1.005–1.030)
Urobilinogen, UA: 0.2 mg/dL (ref 0.0–1.0)
pH: 6 (ref 5.0–8.0)

## 2011-11-02 LAB — COMPREHENSIVE METABOLIC PANEL
ALT: 17 U/L (ref 0–35)
AST: 21 U/L (ref 0–37)
Albumin: 4.2 g/dL (ref 3.5–5.2)
Alkaline Phosphatase: 82 U/L (ref 39–117)
BUN: 8 mg/dL (ref 6–23)
CO2: 23 mEq/L (ref 19–32)
Calcium: 9.6 mg/dL (ref 8.4–10.5)
Chloride: 101 mEq/L (ref 96–112)
Creatinine, Ser: 0.65 mg/dL (ref 0.50–1.10)
GFR calc Af Amer: >90 mL/min (ref >90)
GFR calc non Af Amer: >90 mL/min (ref >90)
Glucose, Bld: 123 mg/dL — ABNORMAL HIGH (ref 70–99)
Potassium: 3.5 mEq/L (ref 3.5–5.1)
Sodium: 137 mEq/L (ref 135–145)
Total Bilirubin: 0.6 mg/dL (ref 0.3–1.2)
Total Protein: 8 g/dL (ref 6.0–8.3)

## 2011-11-02 MED ORDER — SODIUM CHLORIDE 0.9 % IV SOLN: 500.0000 mL | INTRAVENOUS | Status: DC

## 2011-11-02 MED ORDER — ACETAMINOPHEN 325 MG PO TABS
650.0000 mg | ORAL_TABLET | Freq: Once | ORAL | Status: AC
  Administered 2011-11-02: 650 mg via ORAL
  Filled 2011-11-02: qty 2

## 2011-11-02 MED ORDER — SODIUM CHLORIDE 0.9 % IV SOLN
Freq: Once | INTRAVENOUS | Status: AC
  Administered 2011-11-02: 21:00:00 via INTRAVENOUS

## 2011-11-02 MED ORDER — RANITIDINE HCL 150 MG PO TABS: 300.0000 mg | ORAL_TABLET | Freq: Every day | ORAL | Status: DC

## 2011-11-02 NOTE — Telephone Encounter (Signed)
Patient had EGD / Dilation this afternoon (reviewed). Had problems with cough after. Now calls c/o severe chills, temp 100.5, and aches. Sore throat and "chest cold like feeling" but no pain. Has hx asthma... Says she feels awful. Told to go to ER for evaluation with EDP. She agreed.

## 2011-11-02 NOTE — Op Note (Signed)
[facility] [address] [place], [place]  [address]  ENDOSCOPY PROCEDURE REPORT  PATIENT:  Leslie Martinez, Leslie Martinez  MR#:  [id number] BIRTHDATE:  [birth year], 46 yrs. old  GENDER:  female  ENDOSCOPIST:  Hedwig Morton. Juanda Chance, MD Referred by:  PROCEDURE DATE:  11/02/2011 PROCEDURE:  EGD with biopsy, [address], EGD with dilatation over guidewire ASA CLASS:  Class II INDICATIONS:  dysphagia hx es. stricture 2009, 2011, 02/2011 dilated with 16 and 17 mm dilators, taking Nexiem 40 mg po bid Barrett's esophagus on last EGD  MEDICATIONS:   MAC sedation, administered by CRNA, propofol (Diprivan) 300 mg, glycopyrrolate (Robinal) 0.2 mg TOPICAL ANESTHETIC:  Cetacaine Spray  DESCRIPTION OF PROCEDURE:   After the risks benefits and alternatives of the procedure were thoroughly explained, informed consent was obtained.  The LB GIF-H180 [id number] endoscope was introduced through the mouth and advanced to the second portion of the duodenum, without limitations.  The instrument was slowly withdrawn as the mucosa was fully examined. <<PROCEDUREIMAGES>>  A stricture was found in the distal esophagus. fibrotic circumferential benign appearing stricture at g-e junction 37 cm With standard forceps, a biopsy was obtained and sent to pathology (see image3 and image4). Savary dilation over a guidewire 15,16,17,18 mm dilators  Esophagitis was found (see image4, image2, and image1).  A hiatal hernia was found (see image2, image3, and image8). 3 cm nonreducible hernia 37-40 cm  Mild gastritis was found (see image7 and image5).    Retroflexed views revealed no abnormalities.    The scope was then withdrawn from the patient and the procedure completed.  COMPLICATIONS:  None  ENDOSCOPIC IMPRESSION: 1) Stricture in the distal esophagus 2) Esophagitis 3) Hiatal hernia 4) Mild gastritis s/p dilation to 18mm, reflux esophagitis despite of bid PPI's RECOMMENDATIONS: 1) Anti-reflux regimen to be follow add  Ranitidine 300mg  po in the middle of the day OV to discuss NIssen fundoplication should have Barium esophagram to r/o dismotility  REPEAT EXAM:  In 0 year(s) for.  ______________________________ Hedwig Morton. Juanda Chance, MD  CC:  n. eSIGNED:   Hedwig Morton. Brodie at 11/02/2011 01:58 PM  Sheliah Plane, [phone]

## 2011-11-02 NOTE — Progress Notes (Addendum)
Pt continuously coughing in the RR.  Her neck is soft, no crepitus noted.  Oxygen saturation 98-100% on room air  1410- 2 puffs taken from pt's Ventolin inhaler  1415-pt resting quietly now, not coughing currently  1430- Throat soft, no crepitus noted to throat or chest.  Pt isn't coughing now.  States, "My throat feels irritated, like a sore throat.  Doesn't feel swollen."    Patient did not experience any of the following events: a burn prior to discharge; a fall within the facility; wrong site/side/patient/procedure/implant event; or a hospital transfer or hospital admission upon discharge from the facility. [phone]) Patient did not have preoperative order for IV antibiotic SSI prophylaxis. [phone])

## 2011-11-02 NOTE — ED Notes (Signed)
Patient had EDG done today at [facility]. Started running fever of 102 around 6pm. Also reports having cough after procedure and continues to have cough and intermittent shortness of breath. Has had EGDs in the past and has never experienced these symptoms.

## 2011-11-02 NOTE — Patient Instructions (Addendum)

## 2011-11-03 ENCOUNTER — Telehealth: Payer: Self-pay

## 2011-11-03 ENCOUNTER — Telehealth: Payer: Self-pay | Admitting: Internal Medicine

## 2011-11-03 NOTE — Telephone Encounter (Signed)
Left message on answering machine. 

## 2011-11-03 NOTE — Telephone Encounter (Signed)
14:15 phone call return to the pt.  She went to the ED last night for fever 103.  Per the pt ED md said they did not know the origin of the fever but it could be that the pt aspirated during the egd which was done 11/02/11.  Pt discharge to home.  I asked how she felt today.  Per the pt fever broke around 13:00 today.  She still c/o coughing up white color mucous, chest a little tight and body ache.  Pt was schedule off Thursday and Friday.  She request a note to return to work on Monday, August 5th and not having any restrictions.  Please send to [facility] and she will call the pt.  I will be on vacation.

## 2011-11-04 ENCOUNTER — Telehealth: Payer: Self-pay | Admitting: Internal Medicine

## 2011-11-04 NOTE — Telephone Encounter (Signed)
Letter created and patient advised.

## 2011-11-04 NOTE — Telephone Encounter (Signed)
OK to write a work excuse

## 2011-11-04 NOTE — Telephone Encounter (Signed)
See previous phone note.  

## 2011-11-08 ENCOUNTER — Encounter: Payer: Self-pay | Admitting: Internal Medicine

## 2011-11-09 ENCOUNTER — Encounter: Payer: Self-pay | Admitting: *Deleted

## 2011-11-13 NOTE — ED Provider Notes (Signed)
History    46 year old female with fever and cough. Onset around 6:00 this evening. Patient just had a upper endoscopy done by GI earlier today. Patient denies any chest pain. Mild shortness of breath. No abdominal pain. No nausea or vomiting. No back pain. No unusual leg pain or swelling. No urinary complaints. No sick contacts. No diarrhea. CSN: [id number]  Arrival date & time 11/02/11  6962   First MD Initiated Contact with Patient 11/02/11 2102      Chief Complaint  Patient presents with  . Fever    (Consider location/radiation/quality/duration/timing/severity/associated sxs/prior treatment) HPI  Past Medical History  Diagnosis Date  . Depression   . GERD (gastroesophageal reflux disease)   . Heart murmur   . Hypertension   . Angina pectoris   . Esophagitis   . Esophageal stricture   . Gastritis   . 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 Asthma   . Nephrolithiasis     Past Surgical History  Procedure Date  . Vaginal hysterectomy   . Cardiac catheterization   . Appendectomy   . Endometrial ablation   . Kidney polyps removed   . Dilation and curettage of uterus   . Knee arthroscopy   . Wisdom tooth extraction   . Cardiac catheterization     Family History  Problem Relation Age of Onset  . Heart disease Mother   . Heart disease Father   . Diabetes Father   . Colon cancer Maternal Grandmother     History  Substance Use Topics  . Smoking status: Never Smoker   . Smokeless tobacco: Never Used  . Alcohol Use: Yes     Occasional    OB History    Grav Para Term Preterm Abortions TAB SAB Ect Mult Living                  Review of Systems   Review of symptoms negative unless otherwise noted in HPI.   Allergies  Cephalexin; Iodine; Lisinopril; Oxycodone-acetaminophen; and Prochlorperazine edisylate  Home Medications   Current Outpatient Rx  Name Route Sig Dispense Refill  . AMLODIPINE BESYLATE 10 MG PO TABS Oral Take 10 mg by mouth daily.      . ASPIRIN  81 MG PO TABS Oral Take 81 mg by mouth daily.      Marland Kitchen CITALOPRAM HYDROBROMIDE 10 MG PO TABS Oral Take 20 mg by mouth daily.      Marland Kitchen ESOMEPRAZOLE MAGNESIUM 40 MG PO CPDR Oral Take 1 capsule (40 mg total) by mouth 2 (two) times daily. 60 capsule 11  . FUROSEMIDE 20 MG PO TABS Oral Take 20 mg by mouth as needed. edema    . HYDROCHLOROTHIAZIDE 25 MG PO TABS Oral Take 25 mg by mouth daily.      Marland Kitchen METOPROLOL SUCCINATE ER 100 MG PO TB24 Oral Take 100 mg by mouth daily.      Marland Kitchen RANITIDINE HCL 150 MG PO TABS Oral Take 2 tablets (300 mg total) by mouth daily at 3 pm. 30 tablet 11  . VITAMIN D (ERGOCALCIFEROL) 50000 UNITS PO CAPS  50,000 Units every 7 (seven) days.       BP 137/82  Pulse 88  Temp 99.5 F (37.5 C) (Oral)  Resp 22  SpO2 100%  Physical Exam  Nursing note and vitals reviewed. Constitutional: No distress.       Laying in bed. No acute distress. Obese.  HENT:  Head: Normocephalic and atraumatic.  Right Ear: External ear normal.  Left Ear: External  ear normal.  Nose: Nose normal.  Mouth/Throat: Oropharynx is clear and moist. No oropharyngeal exudate.  Eyes: Conjunctivae are normal. Pupils are equal, round, and reactive to light. Right eye exhibits no discharge. Left eye exhibits no discharge.  Neck: Neck supple. No tracheal deviation present.  Cardiovascular: Normal rate, regular rhythm and normal heart sounds.  Exam reveals no gallop and no friction rub.   No murmur heard. Pulmonary/Chest: Effort normal and breath sounds normal. No stridor. No respiratory distress. She has no wheezes. She has no rales.  Abdominal: Soft. She exhibits no distension. There is no tenderness.  Musculoskeletal: She exhibits no edema and no tenderness.       No costovertebral angle tenderness.  Lymphadenopathy:    She has no cervical adenopathy.  Neurological: She is alert.  Skin: Skin is warm and dry.  Psychiatric: She has a normal mood and affect. Her behavior is normal. Thought content normal.     ED Course  Procedures (including critical care time)  Labs Reviewed  CBC WITH DIFFERENTIAL - Abnormal; Notable for the following:    Neutrophils Relative 86 (*)     Neutro Abs 8.6 (*)     Lymphocytes Relative 11 (*)     All other components within normal limits  COMPREHENSIVE METABOLIC PANEL - Abnormal; Notable for the following:    Glucose, Bld 123 (*)     All other components within normal limits  URINALYSIS, ROUTINE W REFLEX MICROSCOPIC - Abnormal; Notable for the following:    APPearance CLOUDY (*)     Hgb urine dipstick SMALL (*)     All other components within normal limits  URINE MICROSCOPIC-ADD ON  LAB REPORT - SCANNED   No results found.   1. Fever       MDM  46 year old female with fever. Just had endoscopy. Consider complication such as esophageal perforation but doubt. Patient is nontoxic. She has no respiratory distress. X-ray is clear. Lab work reassuring. Low suspicion for serious bacterial infection. Plan symptomatic treatment. return precautions were discussed. Outpatient followup        Raeford Razor, MD 11/13/11 [phone]

## 2011-11-24 ENCOUNTER — Encounter: Payer: Self-pay | Admitting: Internal Medicine

## 2011-11-24 ENCOUNTER — Ambulatory Visit (INDEPENDENT_AMBULATORY_CARE_PROVIDER_SITE_OTHER): Payer: BC Managed Care – PPO | Admitting: Internal Medicine

## 2011-11-24 VITALS — BP 134/82 | HR 64 | Ht 69.0 in | Wt 259.6 lb

## 2011-11-24 DIAGNOSIS — K222 Esophageal obstruction: Secondary | ICD-10-CM

## 2011-11-24 DIAGNOSIS — K219 Gastro-esophageal reflux disease without esophagitis: Secondary | ICD-10-CM

## 2011-11-24 DIAGNOSIS — K2100 Gastro-esophageal reflux disease with esophagitis, without bleeding: Secondary | ICD-10-CM

## 2011-11-24 NOTE — Progress Notes (Signed)
Leslie Martinez [birth year] MRN [id number]  History of Present Illness:  This is a 46 year old African American female with severe intractable gastroesophageal reflux which has been documented on prior upper endoscopies for an esophageal stricture in 2009 and in 2011 when she was diagnosed with Barrett's esophagus. She was dilated in November 2012. Her most recent upper endoscopy on 11/02/2011 showed no evidence of Barrett's esophagus.  She had a stricture and moderate sized hiatal hernia. She also had esophagitis despite taking Nexium twice a day. A barium swallow in May 2011 showed a normal esophageal motility and presence of distal esophageal stricture. There was also a question of an esophageal web which was not confirmed on upper endoscopy. She was having gastroesophageal reflux and a hiatal hernia. She came today to discuss possible Nissen fundoplication. She has been refractory to medical treatment. She is overweight but is not gaining any more weight. She has nocturnal symptoms and cough. We have added ranitidine 150 mg in the middle of the day. She would like to proceed with a Nissen fundoplication.  Past Medical History  Diagnosis Date  . Depression   . GERD (gastroesophageal reflux disease)   . Heart murmur   . Hypertension   . Angina pectoris   . Esophagitis   . Esophageal stricture   . Gastritis   . 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 Asthma   . Nephrolithiasis    Past Surgical History  Procedure Date  . Vaginal hysterectomy   . Cardiac catheterization   . Appendectomy   . Endometrial ablation   . Kidney polyps removed   . Dilation and curettage of uterus   . Knee arthroscopy   . Wisdom tooth extraction   . Cardiac catheterization     reports that she has never smoked. She has never used smokeless tobacco. She reports that she drinks alcohol. She reports that she does not use illicit drugs. family history includes Colon cancer in her maternal grandmother; Diabetes in her father;  and Heart disease in her father and mother. Allergies  Allergen Reactions  . Cephalexin     REACTION: rash  . Iodine     REACTION: rash  . Lisinopril   . Oxycodone-Acetaminophen     REACTION: itching  . Prochlorperazine Edisylate     REACTION: hyper        Review of Systems:  The remainder of the 10 point ROS is negative except as outlined in H&P    Assessment and Plan:  Problem #1 Chronic gastroesophageal reflux disease refractory to high-dose PPI's and ranitidine. She has a history of esophageal stricture and at one point had Barrett's esophagus. She has normal esophageal motility as per a barium esophagram within the last 2 years. She is an appropriate candidate for Nissen fundoplication. The only objection  would be her weight. I have discussed the advantages and disadvantages of Nissen fundoplication. She would like to proceed so we will make an appointment for surgical consultation. In the meantime, she will continue on antireflux measures in her medical regimen.   11/24/2011 Leslie Martinez

## 2011-11-24 NOTE — Patient Instructions (Addendum)
You have been scheduled for an appointment with Dr Luretha Murphy at [facility]. Your appointment is on 12/21/11 at 4:00 pm. Please arrive at 3:30 pm for registration. Make certain to bring a list of current medications, including any over the counter medications or vitamins. Also bring your co-pay if you have one as well as your insurance cards. [facility] is located at [address]. Should you need to reschedule your appointment, please contact them at [phone]. CC: Dr Luretha Murphy

## 2011-12-21 ENCOUNTER — Encounter (INDEPENDENT_AMBULATORY_CARE_PROVIDER_SITE_OTHER): Payer: Self-pay | Admitting: Surgery

## 2011-12-21 ENCOUNTER — Ambulatory Visit (INDEPENDENT_AMBULATORY_CARE_PROVIDER_SITE_OTHER): Payer: BC Managed Care – PPO | Admitting: Surgery

## 2011-12-21 VITALS — BP 142/90 | HR 88 | Resp 16 | Ht 69.5 in | Wt 259.0 lb

## 2011-12-21 DIAGNOSIS — K219 Gastro-esophageal reflux disease without esophagitis: Secondary | ICD-10-CM

## 2011-12-21 NOTE — Patient Instructions (Addendum)
Thanks for your patience.  If you need further assistance after leaving the office, please call our office and speak with a [facility] nurse.  [phone].  If you want to leave a message for Dr. Daphine Deutscher, please call his office phone at [phone].  Nissen Fundoplication Care After Please read the instructions outlined below and refer to this sheet for the next few weeks. These discharge instructions provide you with general information on caring for yourself after you leave the hospital. Your doctor may also give you specific instructions. While your treatment has been planned according to the most current medical practices available, unavoidable complications sometimes happen. If you have any problems or questions after discharge, please call your doctor. ACTIVITY  Take frequent rest periods throughout the day.   Take frequent walks throughout the day. This will help to prevent blood clots.   Continue to do your coughing and deep breathing exercises once you get home. This will help to prevent pneumonia.   No strenuous activities such as heavy lifting, pushing or pulling until after your follow-up visit with your doctor. Do not lift anything heavier than 10 pounds.   Talk with your caregiver about when you may return to work and your exercise routine.   You may shower 2 days after surgery. Pat incisions dry. Do not rub incisions with washcloth or towel.   Do not drive while taking prescription pain medication.  NUTRITION  Continue with a liquid diet, or the diet you were directed to take, until your first follow-up visit with your surgeon.   Drink fluids (6-8 glasses a day).   Call your caregiver for persistent nausea (feeling sick to your stomach), vomiting, bloating or difficulty swallowing.  ELIMINATION It is very important not to strain during bowel movements. If constipation should occur, you may:  Take a mild laxative (such as Milk of Magnesia).   Add fruit and bran to your  diet.   Drink more fluids.   Call your caregiver if constipation is not relieved.  FEVER If you feel feverish or have shaking chills, take your temperature. If it is 102 F (38.9 C) or above, call your caregiver. The fever may mean there is an infection. PAIN CONTROL  If a prescription was given for a pain reliever, please follow your caregiver's directions.   Only take over-the-counter or prescription medicines for pain, discomfort, or fever as directed by your caregiver.   If the pain is not relieved by your medicine, becomes worse, or you have difficulty breathing, call your doctor.  INCISION  It is normal for your cuts (incisions) from surgery to have a small amount of drainage for the first 1-2 days. Once the drainage has stopped, leave your incision(s) open to air.   Check your incision(s) and surrounding area daily for any redness, swelling, increased drainage or bleeding. If any of these are present or if the wound edges start to separate, call your doctor.   If you have small adhesive strips in place, they will peel and fall off. (If these strips are covered with a clear bandage, your doctor will tell you when to remove them.)   If you have staples, your caregiver will remove them at the follow-up appointment.  Document Released: 11/12/2003 Document Revised: 03/10/2011 Document Reviewed: 02/15/2007 [facility] Patient Information 2012 [place], [place].

## 2011-12-21 NOTE — Progress Notes (Signed)
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  She has had reflux symptoms for many years with numerous strictures dilated by Dr. Juanda Chance.  She is referred for laparoscopic Nissen fundoplication. I reviewed an upper GI series from 2011 which showed a small hiatal hernia with reflux and a Schatzki's ring.  I gave her a booklet about Nissen fundoplication and described the procedure in detail including complications and need to do this open if necessary. All this material was given to her for her to read at her leisure. She would like to go ahead and get this scheduled.  Her past medical history and review of systems is positive for anemia, arthritis, probable reflux related adult asthma, cervical cancer, borderline diabetes, and the GERD for which we are seeing her for, and hypertension. Her primary care doctor is Dr. Trevor Iha. Her previous procedures include a hysterectomy in 1999, cardiac catheter into thousand and multiple EGDs.  Past Medical History  Diagnosis Date  . Depression   . GERD (gastroesophageal reflux disease)   . Heart murmur   . Hypertension   . Angina pectoris   . Esophagitis   . Esophageal stricture   . Gastritis   . 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 Asthma   . Nephrolithiasis   . Hiatal hernia     Past Surgical History  Procedure Date  . Vaginal hysterectomy   . Cardiac catheterization   . Appendectomy   . Endometrial ablation   . Kidney polyps removed   . Dilation and curettage of uterus   . Knee arthroscopy   . Wisdom tooth extraction   . Cardiac catheterization     Current Outpatient Prescriptions  Medication Sig Dispense Refill  . amLODipine (NORVASC) 10 MG tablet Take 10 mg by mouth daily.        Marland Kitchen aspirin 81 MG tablet Take 81 mg by mouth daily.        . citalopram (CELEXA) 10 MG tablet Take 20 mg by mouth daily.         Marland Kitchen esomeprazole (NEXIUM) 40 MG capsule Take 1 capsule (40 mg total) by mouth 2 (two) times daily.  60 capsule  11  . furosemide (LASIX) 20 MG tablet Take 20 mg by mouth as needed. edema      . hydrochlorothiazide (HYDRODIURIL) 25 MG tablet Take 25 mg by mouth daily.        . metoprolol (TOPROL-XL) 100 MG 24 hr tablet Take 100 mg by mouth daily.        . ranitidine (ZANTAC) 150 MG tablet Take 2 tablets (300 mg total) by mouth daily at 3 pm.  30 tablet  11  . Vitamin D, Ergocalciferol, (DRISDOL) 50000 UNITS CAPS 50,000 Units every 7 (seven) days.        Cephalexin; Iodine; Lisinopril; Oxycodone-acetaminophen; and Prochlorperazine edisylate Family History  Problem Relation Age of Onset  . Heart disease Mother   . Heart disease Father   . Diabetes Father   . Colon cancer Maternal Grandmother    Social History:   reports that she has never smoked. She has never used smokeless tobacco. She reports that she drinks alcohol. She reports that she does not use illicit drugs.   REVIEW OF SYSTEMS - PERTINENT POSITIVES ONLY: See above  Physical Exam:   Blood pressure 142/90, pulse 88, resp. rate 16, height 5'  9.5" (1.765 m), weight 259 lb (117.482 kg). Body mass index is 37.70 kg/(m^2).  Gen:  WDWN AAF NAD  Neurological: Alert and oriented to person, place, and time. Motor and sensory function is grossly intact  Head: Normocephalic and atraumatic.  Eyes: Conjunctivae are normal. Pupils are equal, round, and reactive to light. No scleral icterus.  Neck: Normal range of motion. Neck supple. No tracheal deviation or thyromegaly present.  Cardiovascular:  SR without murmurs or gallops.  No carotid bruits Respiratory: Effort normal.  No respiratory distress. No chest wall tenderness. Breath sounds normal.  No wheezes, rales or rhonchi.  Abdomen:  nontender GU: Musculoskeletal: Normal range of motion. Extremities are nontender. No cyanosis, edema or clubbing noted Lymphadenopathy: No cervical,  preauricular, postauricular or axillary adenopathy is present Skin: Skin is warm and dry. No rash noted. No diaphoresis. No erythema. No pallor. Pscyh: Normal mood and affect. Behavior is normal. Judgment and thought content normal.   LABORATORY RESULTS: No results found for this or any previous visit (from the past 48 hour(s)).  RADIOLOGY RESULTS: No results found.  Problem List: Patient Active Problem List  Diagnosis  . HYPERTENSION  . PRINZMETAL'S ANGINA  . ASTHMA  . DYSPHAGIA UNSPECIFIED  . BARRETT'S ESOPHAGUS, HX OF  . NEPHROLITHIASIS, HX OF    Assessment & Plan: GERD,  Hx of Barrett's and hiatus hernia Lap Nissen fundoplication and repair of hiatus hernia    Matt B. Daphine Deutscher, MD, [facility] [phone] beeper [phone]  12/21/2011 5:30 PM

## 2012-01-13 ENCOUNTER — Encounter (HOSPITAL_COMMUNITY): Payer: Self-pay

## 2012-01-26 NOTE — Progress Notes (Signed)
Please enter orders in EPIC as PST appt. 10/28 at 830 pm. Thanks!

## 2012-01-30 ENCOUNTER — Encounter (HOSPITAL_COMMUNITY)
Admission: RE | Admit: 2012-01-30 | Discharge: 2012-01-30 | Disposition: A | Discharge status: Home / Self Care | Payer: BC Managed Care – PPO | Source: Ambulatory Visit | Attending: Surgery | Admitting: Surgery

## 2012-01-30 ENCOUNTER — Encounter (HOSPITAL_COMMUNITY): Payer: Self-pay

## 2012-01-30 ENCOUNTER — Other Ambulatory Visit: Payer: Self-pay

## 2012-01-30 LAB — BASIC METABOLIC PANEL
BUN: 13 mg/dL (ref 6–23)
Calcium: 9.4 mg/dL (ref 8.4–10.5)
GFR calc Af Amer: >90 mL/min (ref >90)
GFR calc non Af Amer: >90 mL/min (ref >90)
Glucose, Bld: 117 mg/dL — ABNORMAL HIGH (ref 70–99)

## 2012-01-30 LAB — CBC
HCT: 36.4 % (ref 36.0–46.0)
Hemoglobin: 11.9 g/dL — ABNORMAL LOW (ref 12.0–15.0)
MCH: 26.7 pg (ref 26.0–34.0)
MCHC: 32.7 g/dL (ref 30.0–36.0)
RDW: 13.4 % (ref 11.5–15.5)

## 2012-01-30 LAB — SURGICAL PCR SCREEN: MRSA, PCR: NEGATIVE

## 2012-01-30 NOTE — Pre-Procedure Instructions (Signed)
Leslie Martinez  01/30/2012   Your procedure is scheduled on:  Friday 02-03-2012  Report to [facility] at  AM. 0830  Call this number if you have problems the morning of surgery: [phone]   Remember:   Do not drink liquids or  eat food:After Midnight.  Thursday night      Take these medicines the morning of surgery with A SIP OF WATER: Celexa and Nexium   Do not wear jewelry, make-up or nail polish.  Do not wear lotions, powders, or perfumes. You may wear deodorant.  Do not shave 48 hours prior to surgery. Men may shave face and neck.  Do not bring valuables to the hospital.  Contacts, dentures or bridgework may not be worn into surgery.  Leave suitcase in the car. After surgery it may be brought to your room.  For patients admitted to the hospital, checkout time is 11:00 AM the day of discharge.   Patients discharged the day of surgery will not be allowed to drive home.  Name and phone number of your driver:  Leslie Martinez [phone]  See [facility] Preparing for surgery sheet.   Please read over the following fact sheets that you were given: MRSA Information

## 2012-01-30 NOTE — Progress Notes (Signed)
10-15-2011 CXR in  And EKG 01-30-2012 in [facility]

## 2012-02-02 ENCOUNTER — Other Ambulatory Visit (INDEPENDENT_AMBULATORY_CARE_PROVIDER_SITE_OTHER): Payer: Self-pay | Admitting: Surgery

## 2012-02-02 ENCOUNTER — Other Ambulatory Visit: Payer: Self-pay | Admitting: Internal Medicine

## 2012-02-02 NOTE — Progress Notes (Signed)
Message sent to Dr Daphine Deutscher requesting orders for surgery via [facility] paging system.

## 2012-02-03 ENCOUNTER — Ambulatory Visit (HOSPITAL_COMMUNITY): Payer: BC Managed Care – PPO | Admitting: Anesthesiology

## 2012-02-03 ENCOUNTER — Encounter (HOSPITAL_COMMUNITY): Payer: Self-pay | Admitting: *Deleted

## 2012-02-03 ENCOUNTER — Encounter (HOSPITAL_COMMUNITY): Payer: Self-pay | Admitting: Anesthesiology

## 2012-02-03 ENCOUNTER — Observation Stay (HOSPITAL_COMMUNITY)
Admission: RE | Admit: 2012-02-03 | Discharge: 2012-02-06 | DRG: 155 | Disposition: A | Discharge status: Home / Self Care | Payer: BC Managed Care – PPO | Source: Ambulatory Visit | Attending: Surgery | Admitting: Surgery

## 2012-02-03 ENCOUNTER — Telehealth (INDEPENDENT_AMBULATORY_CARE_PROVIDER_SITE_OTHER): Payer: Self-pay | Admitting: General Surgery

## 2012-02-03 ENCOUNTER — Encounter (HOSPITAL_COMMUNITY)
Admission: RE | Disposition: A | Discharge status: Home / Self Care | Payer: Self-pay | Source: Ambulatory Visit | Attending: Surgery

## 2012-02-03 DIAGNOSIS — K2270 Barrett's esophagus without dysplasia: Secondary | ICD-10-CM | POA: Insufficient documentation

## 2012-02-03 DIAGNOSIS — I201 Angina pectoris with documented spasm: Secondary | ICD-10-CM | POA: Insufficient documentation

## 2012-02-03 DIAGNOSIS — I10 Essential (primary) hypertension: Secondary | ICD-10-CM | POA: Insufficient documentation

## 2012-02-03 DIAGNOSIS — D649 Anemia, unspecified: Secondary | ICD-10-CM | POA: Insufficient documentation

## 2012-02-03 DIAGNOSIS — K2100 Gastro-esophageal reflux disease with esophagitis, without bleeding: Secondary | ICD-10-CM

## 2012-02-03 DIAGNOSIS — K222 Esophageal obstruction: Secondary | ICD-10-CM | POA: Insufficient documentation

## 2012-02-03 DIAGNOSIS — R1013 Epigastric pain: Secondary | ICD-10-CM | POA: Insufficient documentation

## 2012-02-03 DIAGNOSIS — J45909 Unspecified asthma, uncomplicated: Secondary | ICD-10-CM | POA: Insufficient documentation

## 2012-02-03 DIAGNOSIS — K449 Diaphragmatic hernia without obstruction or gangrene: Secondary | ICD-10-CM

## 2012-02-03 DIAGNOSIS — K219 Gastro-esophageal reflux disease without esophagitis: Secondary | ICD-10-CM | POA: Insufficient documentation

## 2012-02-03 DIAGNOSIS — K3189 Other diseases of stomach and duodenum: Secondary | ICD-10-CM | POA: Insufficient documentation

## 2012-02-03 DIAGNOSIS — Z9889 Other specified postprocedural states: Secondary | ICD-10-CM | POA: Diagnosis not present

## 2012-02-03 DIAGNOSIS — Z79899 Other long term (current) drug therapy: Secondary | ICD-10-CM | POA: Insufficient documentation

## 2012-02-03 HISTORY — PX: LAPAROSCOPIC NISSEN FUNDOPLICATION: SHX1932

## 2012-02-03 HISTORY — PX: UPPER GI ENDOSCOPY: SHX6162

## 2012-02-03 LAB — CBC
HCT: 34 % — ABNORMAL LOW (ref 36.0–46.0)
Hemoglobin: 11.1 g/dL — ABNORMAL LOW (ref 12.0–15.0)
MCH: 26.9 pg (ref 26.0–34.0)
MCV: 82.3 fL (ref 78.0–100.0)
RBC: 4.13 MIL/uL (ref 3.87–5.11)

## 2012-02-03 SURGERY — FUNDOPLICATION, NISSEN, LAPAROSCOPIC
Anesthesia: General | Site: Esophagus | Wound class: Clean Contaminated

## 2012-02-03 MED ORDER — ACETAMINOPHEN 10 MG/ML IV SOLN
INTRAVENOUS | Status: DC | PRN
  Administered 2012-02-03: 1000 mg via INTRAVENOUS

## 2012-02-03 MED ORDER — ONDANSETRON HCL 4 MG/2ML IJ SOLN
4.0000 mg | INTRAMUSCULAR | Status: DC | PRN
  Administered 2012-02-03 (×2): 4 mg via INTRAVENOUS
  Filled 2012-02-03 (×2): qty 2

## 2012-02-03 MED ORDER — LACTATED RINGERS IV SOLN
INTRAVENOUS | Status: DC
  Administered 2012-02-03: 1000 mL via INTRAVENOUS

## 2012-02-03 MED ORDER — MIDAZOLAM HCL 5 MG/5ML IJ SOLN
INTRAMUSCULAR | Status: DC | PRN
  Administered 2012-02-03: 2 mg via INTRAVENOUS

## 2012-02-03 MED ORDER — CHLORHEXIDINE GLUCONATE 4 % EX LIQD
1.0000 "application " | Freq: Once | CUTANEOUS | Status: DC
  Filled 2012-02-03: qty 15

## 2012-02-03 MED ORDER — MEPERIDINE HCL 50 MG/ML IJ SOLN: 6.2500 mg | INTRAMUSCULAR | Status: DC | PRN

## 2012-02-03 MED ORDER — SUCCINYLCHOLINE CHLORIDE 20 MG/ML IJ SOLN
INTRAMUSCULAR | Status: DC | PRN
  Administered 2012-02-03: 180 mg via INTRAVENOUS

## 2012-02-03 MED ORDER — CIPROFLOXACIN IN D5W 400 MG/200ML IV SOLN
INTRAVENOUS | Status: AC
  Filled 2012-02-03: qty 200

## 2012-02-03 MED ORDER — EPHEDRINE SULFATE 50 MG/ML IJ SOLN
INTRAMUSCULAR | Status: DC | PRN
  Administered 2012-02-03: 5 mg via INTRAVENOUS
  Administered 2012-02-03: 10 mg via INTRAVENOUS
  Administered 2012-02-03: 5 mg via INTRAVENOUS
  Administered 2012-02-03: 10 mg via INTRAVENOUS

## 2012-02-03 MED ORDER — LIDOCAINE HCL (CARDIAC) 20 MG/ML IV SOLN
INTRAVENOUS | Status: DC | PRN
  Administered 2012-02-03: 50 mg via INTRAVENOUS

## 2012-02-03 MED ORDER — OXYCODONE-ACETAMINOPHEN 5-325 MG/5ML PO SOLN
5.0000 mL | ORAL | Status: DC | PRN
  Administered 2012-02-05: 5 mL via ORAL
  Filled 2012-02-03: qty 5

## 2012-02-03 MED ORDER — HEPARIN SODIUM (PORCINE) 5000 UNIT/ML IJ SOLN
5000.0000 [IU] | Freq: Once | INTRAMUSCULAR | Status: AC
  Administered 2012-02-03: 5000 [IU] via SUBCUTANEOUS
  Filled 2012-02-03: qty 1

## 2012-02-03 MED ORDER — PROPOFOL 10 MG/ML IV BOLUS
INTRAVENOUS | Status: DC | PRN
  Administered 2012-02-03: 180 mg via INTRAVENOUS

## 2012-02-03 MED ORDER — ONDANSETRON HCL 4 MG/2ML IJ SOLN
INTRAMUSCULAR | Status: DC | PRN
  Administered 2012-02-03: 4 mg via INTRAVENOUS

## 2012-02-03 MED ORDER — ROCURONIUM BROMIDE 100 MG/10ML IV SOLN
INTRAVENOUS | Status: DC | PRN
  Administered 2012-02-03: 30 mg via INTRAVENOUS
  Administered 2012-02-03 (×2): 10 mg via INTRAVENOUS

## 2012-02-03 MED ORDER — GLYCOPYRROLATE 0.2 MG/ML IJ SOLN
INTRAMUSCULAR | Status: DC | PRN
  Administered 2012-02-03: .6 mg via INTRAVENOUS

## 2012-02-03 MED ORDER — KCL IN DEXTROSE-NACL 20-5-0.45 MEQ/L-%-% IV SOLN
INTRAVENOUS | Status: DC
  Administered 2012-02-03 – 2012-02-04 (×2): via INTRAVENOUS
  Administered 2012-02-04 – 2012-02-05 (×2): 75 mL/h via INTRAVENOUS
  Administered 2012-02-05 – 2012-02-06 (×2): via INTRAVENOUS
  Filled 2012-02-03 (×7): qty 1000

## 2012-02-03 MED ORDER — ACETAMINOPHEN 160 MG/5ML PO SOLN: 650.0000 mg | ORAL | Status: DC | PRN

## 2012-02-03 MED ORDER — BUPIVACAINE LIPOSOME 1.3 % IJ SUSP
20.0000 mL | Freq: Once | INTRAMUSCULAR | Status: AC
  Administered 2012-02-03: 20 mL
  Filled 2012-02-03: qty 20

## 2012-02-03 MED ORDER — NEOSTIGMINE METHYLSULFATE 1 MG/ML IJ SOLN
INTRAMUSCULAR | Status: DC | PRN
  Administered 2012-02-03: 4 mg via INTRAVENOUS

## 2012-02-03 MED ORDER — ACETAMINOPHEN 10 MG/ML IV SOLN
INTRAVENOUS | Status: AC
  Filled 2012-02-03: qty 100

## 2012-02-03 MED ORDER — HYDROMORPHONE HCL PF 1 MG/ML IJ SOLN: 0.2500 mg | INTRAMUSCULAR | Status: DC | PRN

## 2012-02-03 MED ORDER — LACTATED RINGERS IV SOLN
INTRAVENOUS | Status: DC
  Administered 2012-02-03: 1000 mL via INTRAVENOUS
  Administered 2012-02-03: 13:00:00 via INTRAVENOUS

## 2012-02-03 MED ORDER — HYDROMORPHONE HCL PF 1 MG/ML IJ SOLN
0.5000 mg | INTRAMUSCULAR | Status: DC | PRN
  Administered 2012-02-03 – 2012-02-04 (×5): 0.5 mg via INTRAVENOUS
  Filled 2012-02-03 (×5): qty 1

## 2012-02-03 MED ORDER — CIPROFLOXACIN IN D5W 400 MG/200ML IV SOLN
400.0000 mg | INTRAVENOUS | Status: AC
  Administered 2012-02-03: 400 mg via INTRAVENOUS

## 2012-02-03 MED ORDER — DEXAMETHASONE SODIUM PHOSPHATE 4 MG/ML IJ SOLN
8.0000 mg | Freq: Once | INTRAMUSCULAR | Status: DC | PRN
  Filled 2012-02-03: qty 2

## 2012-02-03 MED ORDER — DEXAMETHASONE SODIUM PHOSPHATE 10 MG/ML IJ SOLN
INTRAMUSCULAR | Status: DC | PRN
  Administered 2012-02-03: 10 mg via INTRAVENOUS

## 2012-02-03 MED ORDER — LACTATED RINGERS IV SOLN
INTRAVENOUS | Status: DC | PRN
  Administered 2012-02-03: 1000 mL via INTRAVENOUS

## 2012-02-03 MED ORDER — BUPIVACAINE-EPINEPHRINE 0.25% -1:200000 IJ SOLN
INTRAMUSCULAR | Status: AC
  Filled 2012-02-03: qty 1

## 2012-02-03 MED ORDER — FENTANYL CITRATE 0.05 MG/ML IJ SOLN
INTRAMUSCULAR | Status: DC | PRN
  Administered 2012-02-03 (×3): 50 ug via INTRAVENOUS
  Administered 2012-02-03: 100 ug via INTRAVENOUS

## 2012-02-03 MED ORDER — HEPARIN SODIUM (PORCINE) 5000 UNIT/ML IJ SOLN
5000.0000 [IU] | Freq: Three times a day (TID) | INTRAMUSCULAR | Status: DC
  Administered 2012-02-03 – 2012-02-06 (×9): 5000 [IU] via SUBCUTANEOUS
  Filled 2012-02-03 (×12): qty 1

## 2012-02-03 SURGICAL SUPPLY — 60 items
APPLIER CLIP ROT 10 11.4 M/L (STAPLE)
BENZOIN TINCTURE PRP APPL 2/3 (GAUZE/BANDAGES/DRESSINGS) IMPLANT
CABLE HIGH FREQUENCY MONO STRZ (ELECTRODE) ×3 IMPLANT
CANISTER SUCTION 2500CC (MISCELLANEOUS) ×6 IMPLANT
CLAMP ENDO BABCK 10MM (STAPLE) IMPLANT
CLIP APPLIE ROT 10 11.4 M/L (STAPLE) IMPLANT
CLOTH BEACON ORANGE TIMEOUT ST (SAFETY) ×3 IMPLANT
COVER SURGICAL LIGHT HANDLE (MISCELLANEOUS) ×3 IMPLANT
DECANTER SPIKE VIAL GLASS SM (MISCELLANEOUS) IMPLANT
DERMABOND ADVANCED (GAUZE/BANDAGES/DRESSINGS) ×1
DERMABOND ADVANCED .7 DNX12 (GAUZE/BANDAGES/DRESSINGS) ×2 IMPLANT
DEVICE SUT QUICK LOAD TK 5 (STAPLE) ×18 IMPLANT
DEVICE SUT TI-KNOT TK 5X26 (MISCELLANEOUS) ×3 IMPLANT
DEVICE SUTURE ENDOST 10MM (ENDOMECHANICALS) ×3 IMPLANT
DISSECTOR BLUNT TIP ENDO 5MM (MISCELLANEOUS) ×3 IMPLANT
DRAIN PENROSE 18X1/2 LTX STRL (DRAIN) ×3 IMPLANT
DRAPE LAPAROSCOPIC ABDOMINAL (DRAPES) ×3 IMPLANT
ELECT REM PT RETURN 9FT ADLT (ELECTROSURGICAL) ×3
ELECTRODE REM PT RTRN 9FT ADLT (ELECTROSURGICAL) ×2 IMPLANT
FELT TEFLON 4 X1 (Mesh General) ×3 IMPLANT
FILTER SMOKE EVAC LAPAROSHD (FILTER) ×3 IMPLANT
GLOVE BIOGEL M 8.0 STRL (GLOVE) ×3 IMPLANT
GLOVE BIOGEL PI IND STRL 7.0 (GLOVE) IMPLANT
GLOVE BIOGEL PI INDICATOR 7.0 (GLOVE)
GOWN STRL NON-REIN LRG LVL3 (GOWN DISPOSABLE) IMPLANT
GOWN STRL REIN XL XLG (GOWN DISPOSABLE) ×12 IMPLANT
GRASPER ENDO BABCOCK 10 (MISCELLANEOUS) IMPLANT
GRASPER ENDO BABCOCK 10MM (MISCELLANEOUS)
HAND ACTIVATED (MISCELLANEOUS) ×3 IMPLANT
KIT BASIN OR (CUSTOM PROCEDURE TRAY) ×3 IMPLANT
NS IRRIG 1000ML POUR BTL (IV SOLUTION) ×3 IMPLANT
PENCIL BUTTON HOLSTER BLD 10FT (ELECTRODE) IMPLANT
SCISSORS LAP 5X35 DISP (ENDOMECHANICALS) ×3 IMPLANT
SET IRRIG TUBING LAPAROSCOPIC (IRRIGATION / IRRIGATOR) ×3 IMPLANT
SLEEVE ADV FIXATION 5X100MM (TROCAR) ×3 IMPLANT
SLEEVE Z-THREAD 5X100MM (TROCAR) IMPLANT
SOLUTION ANTI FOG 6CC (MISCELLANEOUS) ×3 IMPLANT
STAPLER VISISTAT 35W (STAPLE) ×3 IMPLANT
STRIP CLOSURE SKIN 1/2X4 (GAUZE/BANDAGES/DRESSINGS) IMPLANT
SUT SURGIDAC NAB ES-9 0 48 120 (SUTURE) ×18 IMPLANT
SUT VIC AB 4-0 SH 18 (SUTURE) ×3 IMPLANT
SYR 30ML LL (SYRINGE) ×3 IMPLANT
TIP INNERVISION DETACH 40FR (MISCELLANEOUS) IMPLANT
TIP INNERVISION DETACH 50FR (MISCELLANEOUS) IMPLANT
TIP INNERVISION DETACH 56FR (MISCELLANEOUS) ×3 IMPLANT
TIPS INNERVISION DETACH 40FR (MISCELLANEOUS)
TRAY FOLEY CATH 14FRSI W/METER (CATHETERS) ×3 IMPLANT
TRAY LAP CHOLE (CUSTOM PROCEDURE TRAY) ×3 IMPLANT
TROCAR ADV FIXATION 11X100MM (TROCAR) IMPLANT
TROCAR ADV FIXATION 5X100MM (TROCAR) ×3 IMPLANT
TROCAR BLADELESS OPT 5 75 (ENDOMECHANICALS) ×3 IMPLANT
TROCAR XCEL BLUNT TIP 100MML (ENDOMECHANICALS) IMPLANT
TROCAR XCEL NON-BLD 11X100MML (ENDOMECHANICALS) ×3 IMPLANT
TROCAR Z-THREAD FIOS 11X100 BL (TROCAR) IMPLANT
TROCAR Z-THREAD FIOS 5X100MM (TROCAR) ×3 IMPLANT
TROCAR Z-THREAD SLEEVE 11X100 (TROCAR) IMPLANT
TUBING CONNECTING 10 (TUBING) ×3 IMPLANT
TUBING ENDO SMARTCAP (MISCELLANEOUS) ×3 IMPLANT
TUBING FILTER THERMOFLATOR (ELECTROSURGICAL) ×3 IMPLANT
WATER STERILE IRR 500ML POUR (IV SOLUTION) ×3 IMPLANT

## 2012-02-03 NOTE — H&P (Signed)
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 She has had reflux symptoms for many years with numerous strictures dilated by Dr. Juanda Chance. She is referred for laparoscopic Nissen fundoplication. I reviewed an upper GI series from 2011 which showed a small hiatal hernia with reflux and a Schatzki's ring.  I gave her a booklet about Nissen fundoplication and described the procedure in detail including complications and need to do this open if necessary. All this material was given to her for her to read at her leisure. She would like to go ahead and get this scheduled.  Her past medical history and review of systems is positive for anemia, arthritis, probable reflux related adult asthma, cervical cancer, borderline diabetes, and the GERD for which we are seeing her for, and hypertension. Her primary care doctor is Dr. Trevor Iha. Her previous procedures include a hysterectomy in 1999, cardiac catheter into thousand and multiple EGDs.  Past Medical History   Diagnosis  Date   .  Depression    .  GERD (gastroesophageal reflux disease)    .  Heart murmur    .  Hypertension    .  Angina pectoris    .  Esophagitis    .  Esophageal stricture    .  Gastritis    .  Barrett esophagus  01/2011   .  Asthma    .  Nephrolithiasis    .  Hiatal hernia     Past Surgical History   Procedure  Date   .  Vaginal hysterectomy    .  Cardiac catheterization    .  Appendectomy    .  Endometrial ablation    .  Kidney polyps removed    .  Dilation and curettage of uterus    .  Knee arthroscopy    .  Wisdom tooth extraction    .  Cardiac catheterization     Current Outpatient Prescriptions   Medication  Sig  Dispense  Refill   .  amLODipine (NORVASC) 10 MG tablet  Take 10 mg by mouth daily.     Marland Kitchen  aspirin 81 MG tablet  Take 81 mg by mouth daily.     .  citalopram (CELEXA) 10  MG tablet  Take 20 mg by mouth daily.     Marland Kitchen  esomeprazole (NEXIUM) 40 MG capsule  Take 1 capsule (40 mg total) by mouth 2 (two) times daily.  60 capsule  11   .  furosemide (LASIX) 20 MG tablet  Take 20 mg by mouth as needed. edema     .  hydrochlorothiazide (HYDRODIURIL) 25 MG tablet  Take 25 mg by mouth daily.     .  metoprolol (TOPROL-XL) 100 MG 24 hr tablet  Take 100 mg by mouth daily.     .  ranitidine (ZANTAC) 150 MG tablet  Take 2 tablets (300 mg total) by mouth daily at 3 pm.  30 tablet  11   .  Vitamin D, Ergocalciferol, (DRISDOL) 50000 UNITS CAPS  50,000 Units every 7 (seven) days.     Cephalexin; Iodine; Lisinopril; Oxycodone-acetaminophen; and Prochlorperazine edisylate  Family History   Problem  Relation  Age of Onset   .  Heart disease  Mother    .  Heart disease  Father    .  Diabetes  Father    .  Colon cancer  Maternal Grandmother    Social History: reports that she has never smoked. She has never used smokeless tobacco. She reports that she drinks alcohol. She reports that she does not use illicit drugs.  REVIEW OF SYSTEMS - PERTINENT POSITIVES ONLY:  See above  Physical Exam:  Blood pressure 142/90, pulse 88, resp. rate 16, height 5' 9.5" (1.765 m), weight 259 lb (117.482 kg).  Body mass index is 37.70 kg/(m^2).  Gen: WDWN AAF NAD  Neurological: Alert and oriented to person, place, and time. Motor and sensory function is grossly intact  Head: Normocephalic and atraumatic.  Eyes: Conjunctivae are normal. Pupils are equal, round, and reactive to light. No scleral icterus.  Neck: Normal range of motion. Neck supple. No tracheal deviation or thyromegaly present.  Cardiovascular: SR without murmurs or gallops. No carotid bruits  Respiratory: Effort normal. No respiratory distress. No chest wall tenderness. Breath sounds normal. No wheezes, rales or rhonchi.  Abdomen: nontender  GU:  Musculoskeletal: Normal range of motion. Extremities are nontender. No cyanosis, edema or  clubbing noted Lymphadenopathy: No cervical, preauricular, postauricular or axillary adenopathy is present Skin: Skin is warm and dry. No rash noted. No diaphoresis. No erythema. No pallor. Pscyh: Normal mood and affect. Behavior is normal. Judgment and thought content normal.  LABORATORY RESULTS:  No results found for this or any previous visit (from the past 48 hour(s)).  RADIOLOGY RESULTS:  No results found.  Problem List:  Patient Active Problem List   Diagnosis   .  HYPERTENSION   .  PRINZMETAL'S ANGINA   .  ASTHMA   .  DYSPHAGIA UNSPECIFIED   .  BARRETT'S ESOPHAGUS, HX OF   .  NEPHROLITHIASIS, HX OF   Assessment & Plan:  GERD, Hx of Barrett's and hiatus hernia  Plan:  Lap Nissen fundoplication and repair of hiatus hernia  The procedure and potential risks and complications and her questions were answered both in the office and subsequently by phone.    Matt B. Daphine Deutscher, MD, [facility]  [phone] beeper  [phone]

## 2012-02-03 NOTE — Telephone Encounter (Signed)
Spoke with pt and let her know that her first PO appt will be on 11/21 at 10:30.

## 2012-02-03 NOTE — Brief Op Note (Signed)
02/03/2012  1:42 PM  PATIENT:  Nile Dear  46 y.o. female  PRE-OPERATIVE DIAGNOSIS:  gerd, barretts  POST-OPERATIVE DIAGNOSIS:  gerd, barretts  PROCEDURE:  Procedure(s) (LRB) with comments: LAPAROSCOPIC NISSEN FUNDOPLICATION (N/A) UPPER GI ENDOSCOPY ()  SURGEON:  Surgeon(s) and Role:    * Valarie Merino, MD - Primary    * Kandis Cocking, MD - Assisting  PHYSICIAN ASSISTANT:   ASSISTANTS: Ovidio Kin, MD, FACS   ANESTHESIA:   general  EBL:  Total I/O In: 2000 [I.V.:2000] Out: 225 [Urine:225]  BLOOD ADMINISTERED:none  DRAINS: none   LOCAL MEDICATIONS USED:  OTHER Exparel  SPECIMEN:  No Specimen  DISPOSITION OF SPECIMEN:  N/A  COUNTS:  YES and NO were correct  TOURNIQUET:  * No tourniquets in log *  DICTATION: .Other Dictation: Dictation Number [id number]  PLAN OF CARE: Admit to inpatient   PATIENT DISPOSITION:  PACU - hemodynamically stable.   Delay start of Pharmacological VTE agent (>24hrs) due to surgical blood loss or risk of bleeding: no

## 2012-02-03 NOTE — Anesthesia Preprocedure Evaluation (Addendum)
Anesthesia Evaluation  Patient identified by MRN, date of birth, ID band Patient awake    Reviewed: Allergy & Precautions, H&P , NPO status , Patient's Chart, lab work & pertinent test results  Airway Mallampati: II TM Distance: >3 FB Neck ROM: Full    Dental No notable dental hx.    Pulmonary neg pulmonary ROS, asthma ,  breath sounds clear to auscultation  Pulmonary exam normal       Cardiovascular hypertension, Pt. on medications - angina- CAD negative cardio ROS  Rhythm:Regular Rate:Normal     Neuro/Psych negative neurological ROS  negative psych ROS   GI/Hepatic negative GI ROS, Neg liver ROS, hiatal hernia, GERD-  ,  Endo/Other  negative endocrine ROS  Renal/GU negative Renal ROS  negative genitourinary   Musculoskeletal negative musculoskeletal ROS (+)   Abdominal   Peds negative pediatric ROS (+)  Hematology negative hematology ROS (+)   Anesthesia Other Findings   Reproductive/Obstetrics negative OB ROS                          Anesthesia Physical Anesthesia Plan  ASA: II  Anesthesia Plan: General   Post-op Pain Management:    Induction: Intravenous, Rapid sequence and Cricoid pressure planned  Airway Management Planned: Oral ETT  Additional Equipment:   Intra-op Plan:   Post-operative Plan: Extubation in OR  Informed Consent: I have reviewed the patients History and Physical, chart, labs and discussed the procedure including the risks, benefits and alternatives for the proposed anesthesia with the patient or authorized representative who has indicated his/her understanding and acceptance.   Dental advisory given  Plan Discussed with: CRNA  Anesthesia Plan Comments:         Anesthesia Quick Evaluation

## 2012-02-03 NOTE — Transfer of Care (Signed)
Immediate Anesthesia Transfer of Care Note  Patient: Leslie Martinez  Procedure(s) Performed: Procedure(s) (LRB) with comments: LAPAROSCOPIC NISSEN FUNDOPLICATION (N/A) UPPER GI ENDOSCOPY ()  Patient Location: PACU  Anesthesia Type:General  Level of Consciousness: awake, alert  and oriented  Airway & Oxygen Therapy: Patient Spontanous Breathing and Patient connected to face mask oxygen  Post-op Assessment: Report given to PACU RN and Post -op Vital signs reviewed and stable  Post vital signs: Reviewed and stable  Complications: No apparent anesthesia complications

## 2012-02-03 NOTE — Interval H&P Note (Signed)
History and Physical Interval Note:  02/03/2012 11:03 AM  Leslie Martinez  has presented today for surgery, with the diagnosis of gerd, barretts  The various methods of treatment have been discussed with the patient and family. After consideration of risks, benefits and other options for treatment, the patient has consented to  Procedure(s) (LRB) with comments: LAPAROSCOPIC NISSEN FUNDOPLICATION (N/A) as a surgical intervention .  The patient's history has been reviewed, patient examined, no change in status, stable for surgery.  I have reviewed the patient's chart and labs.  Questions were answered to the patient's satisfaction.     Myrna Vonseggern B

## 2012-02-03 NOTE — Anesthesia Postprocedure Evaluation (Signed)
  Anesthesia Post-op Note  Patient: Leslie Martinez  Procedure(s) Performed: Procedure(s) (LRB): LAPAROSCOPIC NISSEN FUNDOPLICATION (N/A) UPPER GI ENDOSCOPY ()  Patient Location: PACU  Anesthesia Type: General  Level of Consciousness: awake and alert   Airway and Oxygen Therapy: Patient Spontanous Breathing  Post-op Pain: mild  Post-op Assessment: Post-op Vital signs reviewed, Patient's Cardiovascular Status Stable, Respiratory Function Stable, Patent Airway and No signs of Nausea or vomiting  Post-op Vital Signs: stable  Complications: No apparent anesthesia complications

## 2012-02-04 ENCOUNTER — Inpatient Hospital Stay (HOSPITAL_COMMUNITY): Payer: BC Managed Care – PPO

## 2012-02-04 LAB — CBC WITH DIFFERENTIAL/PLATELET
Eosinophils Relative: 0 % (ref 0–5)
HCT: 36.2 % (ref 36.0–46.0)
Lymphocytes Relative: 11 % — ABNORMAL LOW (ref 12–46)
Lymphs Abs: 1 10*3/uL (ref 0.7–4.0)
MCV: 81.2 fL (ref 78.0–100.0)
Monocytes Absolute: 0.7 10*3/uL (ref 0.1–1.0)
Neutro Abs: 7.3 10*3/uL (ref 1.7–7.7)
RBC: 4.46 MIL/uL (ref 3.87–5.11)
WBC: 9 10*3/uL (ref 4.0–10.5)

## 2012-02-04 MED ORDER — ASPIRIN 81 MG PO TABS
81.0000 mg | ORAL_TABLET | Freq: Every morning | ORAL | Status: DC
  Administered 2012-02-04 – 2012-02-05 (×2): 81 mg via ORAL
  Filled 2012-02-04 (×3): qty 1

## 2012-02-04 MED ORDER — CITALOPRAM HYDROBROMIDE 20 MG PO TABS
20.0000 mg | ORAL_TABLET | Freq: Every day | ORAL | Status: DC
  Administered 2012-02-04 – 2012-02-06 (×3): 20 mg via ORAL
  Filled 2012-02-04 (×3): qty 1

## 2012-02-04 MED ORDER — METOPROLOL SUCCINATE ER 100 MG PO TB24
100.0000 mg | ORAL_TABLET | Freq: Every day | ORAL | Status: DC
  Administered 2012-02-04 – 2012-02-06 (×3): 100 mg via ORAL
  Filled 2012-02-04 (×3): qty 1

## 2012-02-04 MED ORDER — FUROSEMIDE 20 MG PO TABS
20.0000 mg | ORAL_TABLET | ORAL | Status: DC | PRN
  Filled 2012-02-04: qty 1

## 2012-02-04 MED ORDER — AMLODIPINE BESYLATE 10 MG PO TABS
10.0000 mg | ORAL_TABLET | Freq: Every day | ORAL | Status: DC
  Administered 2012-02-04: 10 mg via ORAL
  Filled 2012-02-04 (×3): qty 1

## 2012-02-04 MED ORDER — HYDROCHLOROTHIAZIDE 25 MG PO TABS
25.0000 mg | ORAL_TABLET | Freq: Every day | ORAL | Status: DC
  Administered 2012-02-04 – 2012-02-06 (×3): 25 mg via ORAL
  Filled 2012-02-04 (×3): qty 1

## 2012-02-04 NOTE — Progress Notes (Signed)
Patient ID: Leslie Martinez, female   DOB: April 14, 1965, 46 y.o.   MRN: [id number] [facility] Progress Note:   1 Day Post-Op  Subjective: Mental status is clear Objective: Vital signs in last 24 hours: Temp:  [97.6 F (36.4 C)-99.6 F (37.6 C)] 98.7 F (37.1 C) (11/02 0525) Pulse Rate:  [63-87] 63  (11/02 0525) Resp:  [14-20] 18  (11/02 0525) BP: (120-170)/(68-104) 148/82 mmHg (11/02 0525) SpO2:  [96 %-100 %] 97 % (11/02 0525) Weight:  [259 lb (117.482 kg)] 259 lb (117.482 kg) (11/01 1613)  Intake/Output from previous day: 11/01 0701 - 11/02 0700 In: 4465 [P.O.:120; I.V.:4345] Out: 3875 [Urine:3875] Intake/Output this shift:    Physical Exam: Work of breathing is normal.  Up walking with no complaints.    Lab Results:  Results for orders placed during the hospital encounter of 02/03/12 (from the past 48 hour(s))  CBC     Status: Abnormal   Collection Time   02/03/12  2:15 PM      Component Value Range Comment   WBC 6.7  4.0 - 10.5 K/uL    RBC 4.13  3.87 - 5.11 MIL/uL    Hemoglobin 11.1 (*) 12.0 - 15.0 g/dL    HCT 78.2 (*) 95.6 - 46.0 %    MCV 82.3  78.0 - 100.0 fL    MCH 26.9  26.0 - 34.0 pg    MCHC 32.6  30.0 - 36.0 g/dL    RDW 21.3  08.6 - 57.8 %    Platelets 137 (*) 150 - 400 K/uL   CREATININE, SERUM     Status: Normal   Collection Time   02/03/12  2:15 PM      Component Value Range Comment   Creatinine, Ser 0.68  0.50 - 1.10 mg/dL    GFR calc non Af Amer >90  >90 mL/min    GFR calc Af Amer >90  >90 mL/min   CBC WITH DIFFERENTIAL     Status: Abnormal   Collection Time   02/04/12  4:18 AM      Component Value Range Comment   WBC 9.0  4.0 - 10.5 K/uL    RBC 4.46  3.87 - 5.11 MIL/uL    Hemoglobin 12.0  12.0 - 15.0 g/dL    HCT 46.9  62.9 - 52.8 %    MCV 81.2  78.0 - 100.0 fL    MCH 26.9  26.0 - 34.0 pg    MCHC 33.1  30.0 - 36.0 g/dL    RDW 41.3  24.4 - 01.0 %    Platelets 159  150 - 400 K/uL    Neutrophils Relative 81 (*) 43 - 77 %    Neutro Abs 7.3   1.7 - 7.7 K/uL    Lymphocytes Relative 11 (*) 12 - 46 %    Lymphs Abs 1.0  0.7 - 4.0 K/uL    Monocytes Relative 8  3 - 12 %    Monocytes Absolute 0.7  0.1 - 1.0 K/uL    Eosinophils Relative 0  0 - 5 %    Eosinophils Absolute 0.0  0.0 - 0.7 K/uL    Basophils Relative 0  0 - 1 %    Basophils Absolute 0.0  0.0 - 0.1 K/uL     Radiology/Results: Dg Ugi W/water Sol Cm  02/04/2012  *RADIOLOGY REPORT*  Clinical Data:  Postop Nissen fundoplication 02/03/2012  LIMITED UPPER GI SERIES  Technique: Limited upper GI exam performed centered at the GE  junction and postoperative site.  The patient was administered 50 ml Omnipaque-300.  Fluoroscopy Time: 0.57 minutes  Comparison:  11/04/2009  Findings: With the patient upright, she had was administered water soluble contrast orally.  There is slight delay in passage through the GE junction operative site but with additional swallows contrast does pass through the Nissen fundoplication into the stomach without obstruction or definite extravasation.  IMPRESSION: Patent GE junction following Nissen fundoplication.  Slight delay in distal esophageal emptying.   Original Report Authenticated By: Judie Petit. Miles Costain, M.D.     Anti-infectives: Anti-infectives     Start     Dose/Rate Route Frequency Ordered Stop   02/03/12 0845   ciprofloxacin (CIPRO) IVPB 400 mg        400 mg 200 mL/hr over 60 Minutes Intravenous On call to O.R. 02/03/12 0834 02/03/12 1103          Assessment/Plan: Problem List: Patient Active Problem List  Diagnosis  . HYPERTENSION  . PRINZMETAL'S ANGINA  . ASTHMA  . DYSPHAGIA UNSPECIFIED  . BARRETT'S ESOPHAGUS, HX OF  . NEPHROLITHIASIS, HX OF  . GERD (gastroesophageal reflux disease)    Swallow OK.  Start clears. Restart most meds except GER meds. 1 Day Post-Op    LOS: 1 day   Matt B. Daphine Deutscher, MD, [facility] [phone] beeper [phone]  02/04/2012 9:32 AM

## 2012-02-04 NOTE — Plan of Care (Signed)
Problem: Phase II Progression Outcomes Goal: Progressing with IS, TCDB Outcome: Progressing Instructed pt on TCDB and IS- pt voiced understanding and returned demonstration.  IS- 1500

## 2012-02-04 NOTE — Op Note (Signed)
Martinez, Leslie                 ACCOUNT NO.:  [id number]  MEDICAL RECORD NO.:  [id number]  LOCATION:  1524                         FACILITY:  [facility]  PHYSICIAN:  Thornton Park. Daphine Deutscher, MD  DATE OF BIRTH:  04/22/1965  DATE OF PROCEDURE:  02/04/2012 DATE OF DISCHARGE:                              OPERATIVE REPORT   PREOPERATIVE DIAGNOSIS:  A long-standing gastroesophageal reflux with Schatzki ring and hiatal hernia.  History of Barrett's esophagus.  PROCEDURE:  Laparoscopic takedown of hiatal hernia, posterior repair of hiatal hernia with two sutures and Nissen fundoplication above the aberrant gastro-hepatic branch.  SURGEON:  Thornton Park. Daphine Deutscher, MD  ASSISTANT:  Sandria Bales. Ezzard Standing, M.D.  ANESTHESIA:  General endotracheal.  PROCEDURE:  This 46 year old lady from [place] was taken to room #1 on Friday, February 03, 2012, given general anesthesia.  The abdomen was prepped with PCMX and draped sterilely.  After time-out, the abdomen was entered through the left upper quadrant using a 0 degree Optiview trocar technique without difficulty and the abdomen was insufflated.  She had some of her omentum stuck distally up into what may be a little umbilical hernia, but that was not disturbed.  Trocars were placed in standard position in terms to the left 5 midline for the camera and 11 just to the right of the midline for a working trocar port and then another 5 laterally.  5 mm was used in the upper midline for the [facility].  With the [facility] in place, we were able to expose the foregut and I began my dissection on the right gastrohepatic window opening this with the Harmonic.  She had a replaced hepatic vessel which was quite large, went up into the fissure between the left lateral segment and I elected to preserve that, although it did make our dissection little more awkward.  I went anteriorly and took the reflection off the esophagus and freed the right crus from the  esophagus as well.  On the left side, I took down the short gastrics and went up, and did a high mobilization along the left crus, and completely freed those structures, and then went up and brought the very stuck to the distal esophagus down into the abdomen.  We got around behind this with a Penrose and used that for traction.  Dr. Ezzard Standing endoscoped the patient, so we could check her anatomy because as we expected after looking out there was some esophageal foreshortening and this required some added dissection posteriorly and laterally to free some dense adhesions and enable [place] to get adequate length into the abdomen.  I removed a portion of the fat pad that was sort of bulking up the foregut.  I elected to close this posteriorly, although the defect was not huge, the Schatzki ring in the very dense adhesions, I tested the length of her symptoms. Two sutures were placed posteriorly and the farther responded posteriorly, had pledgets on it and then a more medial one toward the esophagus was placed, and these were secured with tie knots.  The posterior stomach portion for wrapping was selected, it was very free, brought over was under no tension.  The 56-lighted bougie  was passed without difficulty and then a portion of the stomach that was contiguous with the wrapped portion was then brought up and these met and three sutures tacked the stomach to the distal esophagus to the wrapped portion of the stomach, and were secured with tie knots.  These were all placed above this baron vessel which was on the gastrohepatic membrane. The wounds were all injected with Exparel using a total of 40 mL diluted from a 20 mL all.  Everything looked to be in order.  The abdomen was deflated.  Port sites were inserted and wounds were closed with 4-0 Vicryl and Dermabond.  The patient tolerated the procedure well and was taken to the recovery room in satisfactory condition.     Thornton Park Daphine Deutscher,  MD     MBM/MEDQ  D:  02/03/2012  T:  02/04/2012  Job:  [id number]  cc:   Hedwig Morton. Juanda Chance, MD [address] [place] [address]  Sandria Bales. Ezzard Standing, M.D. [address] [place] [address]

## 2012-02-05 LAB — CBC WITH DIFFERENTIAL/PLATELET
Basophils Absolute: 0 K/uL (ref 0.0–0.1)
Basophils Relative: 0 % (ref 0–1)
Eosinophils Absolute: 0.3 K/uL (ref 0.0–0.7)
Eosinophils Relative: 4 % (ref 0–5)
HCT: 33.6 % — ABNORMAL LOW (ref 36.0–46.0)
Hemoglobin: 10.8 g/dL — ABNORMAL LOW (ref 12.0–15.0)
Lymphocytes Relative: 40 % (ref 12–46)
Lymphs Abs: 2.7 K/uL (ref 0.7–4.0)
MCH: 26.5 pg (ref 26.0–34.0)
MCHC: 32.1 g/dL (ref 30.0–36.0)
MCV: 82.6 fL (ref 78.0–100.0)
Monocytes Absolute: 0.6 K/uL (ref 0.1–1.0)
Monocytes Relative: 8 % (ref 3–12)
Neutro Abs: 3.2 K/uL (ref 1.7–7.7)
Neutrophils Relative %: 47 % (ref 43–77)
Platelets: 144 K/uL — ABNORMAL LOW (ref 150–400)
RBC: 4.07 MIL/uL (ref 3.87–5.11)
RDW: 13.6 % (ref 11.5–15.5)
WBC: 6.7 K/uL (ref 4.0–10.5)

## 2012-02-05 NOTE — Progress Notes (Signed)
Utilization review completed.  

## 2012-02-05 NOTE — Progress Notes (Signed)
General Surgery Note  LOS: 2 days  POD -  @RRDAYSPOSTSURG @  Assessment/Plan: 1.   LAPAROSCOPIC NISSEN FUNDOPLICATION, UPPER GI ENDOSCOPY - M. Daphine Deutscher - 02/03/2012  UGI - 02/04/2012 - okay except for some mild delayed emptying of distal esophagus  Doing well.  2.  History of hypertensio 3.  Asthma 4.  History of nephrolithiasis.   Subjective:  Taking liquids without nausea or vomiting.  Daughter is in room. Objective:   Filed Vitals:   02/05/12 0957  BP: 125/81  Pulse: 69  Temp: 98.1 F (36.7 C)  Resp: 18     Intake/Output from previous day:  11/02 0701 - 11/03 0700 In: 2146.3 [P.O.:240; I.V.:1906.3] Out: 2900 [Urine:2900]  Intake/Output this shift:  Total I/O In: -  Out: 700 [Urine:700]   Physical Exam:   General: Tall WN AA F who is alert and oriented.    HEENT: Normal. Pupils equal. .   Lungs: Clear.   Abdomen: Soft.  BS present.   Wound: Clean   Neurologic:  Grossly intact to motor and sensory function.   Psychiatric: Has normal mood and affect.   Lab Results:    Basename 02/05/12 0534 02/04/12 0418  WBC 6.7 9.0  HGB 10.8* 12.0  HCT 33.6* 36.2  PLT 144* 159    BMET   Basename 02/03/12 1415  NA --  K --  CL --  CO2 --  GLUCOSE --  BUN --  CREATININE 0.68  CALCIUM --    PT/INR  No results found for this basename: LABPROT:2,INR:2 in the last 72 hours  ABG  No results found for this basename: PHART:2,PCO2:2,PO2:2,HCO3:2 in the last 72 hours   Studies/Results:  Dg Ugi W/water Sol Cm  02/04/2012  *RADIOLOGY REPORT*  Clinical Data:  Postop Nissen fundoplication 02/03/2012  LIMITED UPPER GI SERIES  Technique: Limited upper GI exam performed centered at the GE junction and postoperative site.  The patient was administered 50 ml Omnipaque-300.  Fluoroscopy Time: 0.57 minutes  Comparison:  11/04/2009  Findings: With the patient upright, she had was administered water soluble contrast orally.  There is slight delay in passage through the GE junction  operative site but with additional swallows contrast does pass through the Nissen fundoplication into the stomach without obstruction or definite extravasation.  IMPRESSION: Patent GE junction following Nissen fundoplication.  Slight delay in distal esophageal emptying.   Original Report Authenticated By: Judie Petit. Miles Costain, M.D.      Anti-infectives:   Anti-infectives     Start     Dose/Rate Route Frequency Ordered Stop   02/03/12 0845   ciprofloxacin (CIPRO) IVPB 400 mg        400 mg 200 mL/hr over 60 Minutes Intravenous On call to O.R. 02/03/12 0834 02/03/12 1103          Ovidio Kin, MD, FACS Pager: [phone],   [facility] Office: [phone] 02/05/2012

## 2012-02-06 ENCOUNTER — Encounter (HOSPITAL_COMMUNITY): Payer: Self-pay | Admitting: Surgery

## 2012-02-06 DIAGNOSIS — Z9889 Other specified postprocedural states: Secondary | ICD-10-CM | POA: Diagnosis not present

## 2012-02-06 MED ORDER — OXYCODONE-ACETAMINOPHEN 5-325 MG/5ML PO SOLN: 5.0000 mL | ORAL | Status: DC | PRN

## 2012-02-06 MED ORDER — ASPIRIN 81 MG PO CHEW
81.0000 mg | CHEWABLE_TABLET | Freq: Every day | ORAL | Status: DC
  Administered 2012-02-06: 81 mg via ORAL
  Filled 2012-02-06: qty 1

## 2012-02-06 NOTE — Progress Notes (Signed)
D/C instructions with script  And f/u given to pt in the presence of family.  Pt out of dept via w/c. dph

## 2012-02-06 NOTE — Discharge Summary (Signed)
Physician Discharge Summary  Patient ID: Leslie Martinez MRN: [id number] DOB/AGE: July 07, 1965 46 y.o.  Admit date: 02/03/2012 Discharge date: 02/06/2012  Admission Diagnoses:  GERD, hiatus hernia and hx of Barrett's esophagus  Discharge Diagnoses:  same  Active Problems:  Lap Nissen Nov 2013   Surgery:  Lap Nissen and repair of hiatus hernia  Discharged Condition: improved  Hospital Course:   Had Nissen on Friday and swallow on Saturday.  Ready for discharge back to [facility] on Monday  Consults: none  Significant Diagnostic Studies: UGI    Discharge Exam: Blood pressure 129/67, pulse 76, temperature 98.9 F (37.2 C), temperature source Oral, resp. rate 20, height 5\' 9"  (1.753 m), weight 259 lb (117.482 kg), SpO2 98.00%. Incisions bland ; no abdominal pain   Disposition: 01-Home or Self Care  Discharge Orders    Future Appointments: Provider: Department: Dept Phone: Center:   02/23/2012 10:30 AM Valarie Merino, MD [facility], [place] [phone] None     Future Orders Please Complete By Expires   Increase activity slowly      Discharge instructions      Comments:   Full liquids for a week then pureed diet for 4 weeks. The last things to add to your diet after 4 weeks are meats and doughy breads   No wound care          Medication List     As of 02/06/2012  1:11 PM    STOP taking these medications         NEXIUM 40 MG capsule   Generic drug: esomeprazole      ranitidine 150 MG tablet   Commonly known as: ZANTAC      TAKE these medications         amLODipine 10 MG tablet   Commonly known as: NORVASC   Take 10 mg by mouth at bedtime.      aspirin 81 MG tablet   Take 81 mg by mouth every morning.      citalopram 10 MG tablet   Commonly known as: CELEXA   Take 20 mg by mouth daily.      furosemide 20 MG tablet   Commonly known as: LASIX   Take 20 mg by mouth as needed. edema      hydrochlorothiazide 25 MG tablet   Commonly known as:  HYDRODIURIL   Take 25 mg by mouth daily.      metoprolol succinate 100 MG 24 hr tablet   Commonly known as: TOPROL-XL   Take 100 mg by mouth daily.      oxyCODONE-acetaminophen 5-325 MG/5ML solution   Commonly known as: ROXICET   Take 5 mLs by mouth every 4 (four) hours as needed for pain.      Vitamin D (Ergocalciferol) 50000 UNITS Caps   Commonly known as: DRISDOL   50,000 Units every 7 (seven) days.         SignedLuretha Murphy B 02/06/2012, 1:11 PM

## 2012-02-23 ENCOUNTER — Ambulatory Visit (INDEPENDENT_AMBULATORY_CARE_PROVIDER_SITE_OTHER): Payer: BC Managed Care – PPO | Admitting: Surgery

## 2012-02-23 ENCOUNTER — Encounter (INDEPENDENT_AMBULATORY_CARE_PROVIDER_SITE_OTHER): Payer: Self-pay | Admitting: Surgery

## 2012-02-23 ENCOUNTER — Encounter (INDEPENDENT_AMBULATORY_CARE_PROVIDER_SITE_OTHER): Payer: Self-pay

## 2012-02-23 VITALS — BP 122/88 | HR 60 | Temp 97.8°F | Resp 18 | Ht 69.0 in | Wt 247.0 lb

## 2012-02-23 DIAGNOSIS — Z9889 Other specified postprocedural states: Secondary | ICD-10-CM

## 2012-02-23 DIAGNOSIS — Z09 Encounter for follow-up examination after completed treatment for conditions other than malignant neoplasm: Secondary | ICD-10-CM

## 2012-02-23 NOTE — Progress Notes (Signed)
Leslie Martinez 46 y.o.  Body mass index is 36.48 kg/(m^2).  Patient Active Problem List  Diagnosis  . HYPERTENSION  . PRINZMETAL'S ANGINA  . ASTHMA  . DYSPHAGIA UNSPECIFIED  . BARRETT'S ESOPHAGUS, HX OF  . NEPHROLITHIASIS, HX OF  . Lap Nissen Nov 2013    Allergies  Allergen Reactions  . Cephalexin     REACTION: rash  . Iodine     REACTION: rash  . Lisinopril   . Oxycodone-Acetaminophen     REACTION: itching  . Prochlorperazine Edisylate     REACTION: hyper    Past Surgical History  Procedure Date  . Vaginal hysterectomy   . Cardiac catheterization   . Appendectomy   . Endometrial ablation   . Kidney polyps removed   . Dilation and curettage of uterus   . Knee arthroscopy   . Wisdom tooth extraction   . Cardiac catheterization   . Laparoscopic nissen fundoplication 02/03/2012    Procedure: LAPAROSCOPIC NISSEN FUNDOPLICATION;  Surgeon: Valarie Merino, MD;  Location: [facility];  Service: General;  Laterality: N/A;  . Upper gi endoscopy 02/03/2012    Procedure: UPPER GI ENDOSCOPY;  Surgeon: Valarie Merino, MD;  Location: [facility];  Service: General;;   Provider Not In System No diagnosis found.  First postop visit for Leslie Martinez who underwent laparoscopic Nissen fundoplication on Nov 1 for severe GERD and hiatal hernia. She's having some left upper quadrant incisional pain with increased walking. Her upper midline trocar site had the liver retractor and she has some skin irritation from that site. She is able to sleep flat and is not having any reflux. She has anticipated dysphagia from postoperative swelling and is still taking a pured.  She has had some nausea eating some fish and thankfully did not vomit. I will give her some Phenergan tabs in case this recurs. She works in a facility with disruptive adolescence and I'm concerned about her getting kicked in the abdomen and we'll be keeping her out until the first of the year. Since she comes here from [place] I will  make arrangements for her to come back and see me at the end of December prior to going back to work. Matt B. Daphine Deutscher, MD, [facility] [phone] beeper [phone]  02/23/2012 10:57 AM

## 2012-02-23 NOTE — Patient Instructions (Signed)
Maintain pureed diet for 4-5 weeks from your surgery.

## 2012-03-29 ENCOUNTER — Encounter (INDEPENDENT_AMBULATORY_CARE_PROVIDER_SITE_OTHER): Payer: Self-pay

## 2012-03-29 ENCOUNTER — Encounter (INDEPENDENT_AMBULATORY_CARE_PROVIDER_SITE_OTHER): Payer: Self-pay | Admitting: Surgery

## 2012-03-29 ENCOUNTER — Ambulatory Visit (INDEPENDENT_AMBULATORY_CARE_PROVIDER_SITE_OTHER): Payer: BC Managed Care – PPO | Admitting: Surgery

## 2012-03-29 VITALS — BP 138/88 | HR 65 | Temp 97.7°F | Resp 18 | Ht 69.0 in | Wt 250.0 lb

## 2012-03-29 DIAGNOSIS — Z09 Encounter for follow-up examination after completed treatment for conditions other than malignant neoplasm: Secondary | ICD-10-CM

## 2012-03-29 DIAGNOSIS — Z9889 Other specified postprocedural states: Secondary | ICD-10-CM

## 2012-03-29 NOTE — Patient Instructions (Signed)
May return to work on Apr 04, 2012

## 2012-03-29 NOTE — Progress Notes (Signed)
Nile Dear 46 y.o.  Body mass index is 36.92 kg/(m^2).  Patient Active Problem List  Diagnosis  . HYPERTENSION  . PRINZMETAL'S ANGINA  . ASTHMA  . DYSPHAGIA UNSPECIFIED  . BARRETT'S ESOPHAGUS, HX OF  . NEPHROLITHIASIS, HX OF  . Lap Nissen Nov 2013    Allergies  Allergen Reactions  . Cephalexin     REACTION: rash  . Iodine     REACTION: rash  . Lisinopril   . Oxycodone-Acetaminophen     REACTION: itching  . Prochlorperazine Edisylate     REACTION: hyper    Past Surgical History  Procedure Date  . Vaginal hysterectomy   . Cardiac catheterization   . Appendectomy   . Endometrial ablation   . Kidney polyps removed   . Dilation and curettage of uterus   . Knee arthroscopy   . Wisdom tooth extraction   . Cardiac catheterization   . Laparoscopic nissen fundoplication 02/03/2012    Procedure: LAPAROSCOPIC NISSEN FUNDOPLICATION;  Surgeon: Valarie Merino, MD;  Location: [facility];  Service: General;  Laterality: N/A;  . Upper gi endoscopy 02/03/2012    Procedure: UPPER GI ENDOSCOPY;  Surgeon: Valarie Merino, MD;  Location: [facility];  Service: General;;   Provider Not In System No diagnosis found.  Doing very well. Sleeping without reflux.  Occasional dysphagia with meats.  She will return to work on Apr 04 2012.  I'll see her again as needed.   Matt B. Daphine Deutscher, MD, [facility] [phone] beeper [phone]  03/29/2012 12:57 PM

## 2012-05-19 ENCOUNTER — Other Ambulatory Visit: Payer: Self-pay

## 2012-05-23 LAB — METABOLIC PANEL, COMPREHENSIVE
A-G Ratio: 1 — ABNORMAL LOW (ref 1.1–2.2)
ALT (SGPT): 33 U/L (ref 12–78)
AST (SGOT): 29 U/L (ref 15–37)
Albumin: 3.8 g/dL (ref 3.5–5.0)
Alk. phosphatase: 94 U/L (ref 50–136)
Anion gap: 8 mmol/L (ref 5–15)
BUN/Creatinine ratio: 24 — ABNORMAL HIGH (ref 12–20)
BUN: 10 MG/DL (ref 6–20)
Bilirubin, total: 0.5 MG/DL (ref 0.2–1.0)
CO2: 23 MMOL/L (ref 21–32)
Calcium: 9.1 MG/DL (ref 8.5–10.1)
Chloride: 109 MMOL/L — ABNORMAL HIGH (ref 97–108)
Creatinine: 0.42 MG/DL — ABNORMAL LOW (ref 0.45–1.15)
GFR est AA: >60 mL/min/{1.73_m2} (ref >60)
GFR est non-AA: >60 mL/min/{1.73_m2} (ref >60)
Globulin: 3.9 g/dL (ref 2.0–4.0)
Glucose: 91 MG/DL (ref 65–100)
Potassium: 4.3 MMOL/L (ref 3.5–5.1)
Protein, total: 7.7 g/dL (ref 6.4–8.2)
Sodium: 140 MMOL/L (ref 136–145)

## 2012-05-23 LAB — CBC WITH AUTOMATED DIFF
ABS. BASOPHILS: 0 10*3/uL (ref 0.0–0.1)
ABS. EOSINOPHILS: 0.1 10*3/uL (ref 0.0–0.4)
ABS. LYMPHOCYTES: 2.1 10*3/uL (ref 0.8–3.5)
ABS. MONOCYTES: 0.4 10*3/uL (ref 0.0–1.0)
ABS. NEUTROPHILS: 1.3 10*3/uL — ABNORMAL LOW (ref 1.8–8.0)
BASOPHILS: 0 % (ref 0–1)
EOSINOPHILS: 3 % (ref 0–7)
HCT: 36.8 % (ref 35.0–47.0)
HGB: 12 g/dL (ref 11.5–16.0)
LYMPHOCYTES: 54 % — ABNORMAL HIGH (ref 12–49)
MCH: 27 PG (ref 26.0–34.0)
MCHC: 32.6 g/dL (ref 30.0–36.5)
MCV: 82.7 FL (ref 80.0–99.0)
MONOCYTES: 10 % (ref 5–13)
NEUTROPHILS: 33 % (ref 32–75)
PLATELET: 190 10*3/uL (ref 150–400)
RBC: 4.45 M/uL (ref 3.80–5.20)
RDW: 13.7 % (ref 11.5–14.5)
WBC: 3.8 10*3/uL (ref 3.6–11.0)

## 2012-05-23 LAB — CK W/ REFLX CKMB: CK: 227 U/L — ABNORMAL HIGH (ref 26–192)

## 2012-05-23 LAB — CK-MB,QUANT.
CK - MB: 1.7 NG/ML (ref 0.5–3.6)
CK-MB Index: 0.7 (ref 0–2.5)

## 2012-05-23 LAB — D DIMER: D-dimer: 0.25 mg/L FEU (ref 0.00–0.65)

## 2012-05-23 LAB — TROPONIN I: Troponin-I, Qt.: <0.04 ng/mL (ref <0.05)

## 2012-05-23 LAB — D-DIMER, QUANTITATIVE: D-Dimer, Quant: 0.25 mg/L FEU (ref 0.00–0.65)

## 2012-05-23 MED ADMIN — nitroglycerin (NITROSTAT) tablet 0.4 mg: SUBLINGUAL | @ 22:00:00 | NDC 68462014625

## 2012-05-23 MED ADMIN — aspirin chewable tablet 162 mg: ORAL | @ 22:00:00

## 2012-05-23 NOTE — ED Notes (Signed)
Pt on monitor X3

## 2012-05-23 NOTE — Progress Notes (Signed)
Admission Medication Reconciliation:    Information obtained from: Patient    Significant PMH/Disease States:   Past Medical History   Diagnosis Date   ??? Hypertension    ??? GERD (gastroesophageal reflux disease)    ??? Arthritis    ??? Asthma    ??? Anginal pain        Chief Complaint for this Admission:  Chest pressure    Allergies:  Cephalexin; Compazine; Iodine; Lisinopril; and Percocet    Prior to Admission Medications:   Prior to Admission Medications   Outpatient Medications Last Dose Informant Patient Reported? Taking?   Hydrochlorothiazide 12.5 mg tablet 05/22/2012 at Unknown  Yes Yes   Sig: Take 25 mg by mouth nightly.   albuterol (PROVENTIL HFA, VENTOLIN HFA) 90 mcg/actuation inhaler   Yes Yes   Sig: Take 2 Puffs by inhalation every four (4) hours as needed.   amLODIPine (NORVASC) 10 mg tablet 05/09/2012 at Unknown  Yes Yes   Sig: Take 10 mg by mouth nightly.   aspirin delayed-release 81 mg tablet 05/23/2012 at Unknown  Yes Yes   Sig: Take 81 mg by mouth daily.   citalopram (CELEXA) 20 mg tablet 05/23/2012 at Unknown  Yes Yes   Sig: Take 20 mg by mouth daily.   metoprolol succinate (TOPROL-XL) 100 mg XL tablet 05/22/2012 at Unknown  Yes Yes   Sig: Take 100 mg by mouth nightly.   promethazine (PHENERGAN) 25 mg tablet 05/16/2012 at Unknown  Yes Yes   Sig: Take 25 mg by mouth every six (6) hours as needed for Nausea.   therapeutic multivitamin (THERAGRAN) tablet 05/23/2012 at Unknown  Yes Yes   Sig: Take 1 Tab by mouth daily.      Facility-Administered Medications: None       Comments/Recommendations:   Reviewed medications/allergies with pt  Updated PTA med list  - Added ASA, citalopram, multivitamin  - Removed esomeprazole (no longer taking since fundoplication surgery)  - Adjusted administration of amlodipine, HCTZ, metoprolol to evening    Noted pt has not been taking amlodipine for ~2 weeks because she thinks it has been giving her headaches.  She was going to discuss with her outpatient physician to see if she could  change medications

## 2012-05-23 NOTE — H&P (Signed)
Name:       Diane Lambert, Diane Lambert                     Admitted:    05/23/2012    Account #:  [id number]                     DOB:         12-22-1965  Physician:  Jaci Lazier, MD                Age:         47                               HISTORY AND PHYSICAL      PRIMARY CARE PHYSICIAN: Dr. Ignacia Palma.    PRESENTING COMPLAINT: Chest pain.    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 The  patient says that she has been diagnosed with Prinzmetal angina. She had a  normal cardiac catheterization done in 2000. She was admitted last year in  [place] with chest pain. At that time she had a nuclear stress test  which was negative. Today, she was driving when she noticed retrosternal  chest pain radiating to the left arm and to the jaw. The patient did not  have nitroglycerin. She came to the emergency room, where nitroglycerin  relieved her symptoms. The patient also has recent Nissen fundoplication  done in [place]. She denies having any significant shortness of  breath. She has no fever or chills.    PAST MEDICAL HISTORY  1. Hypertension.  2. GERD.  3. Angina.  4. Asthma.    SOCIOECONOMIC HISTORY: The patient does not smoke. She drinks socially. She  is single and works as a Engineer, civil (consulting).    FAMILY HISTORY: Significant for coronary artery disease. Mother had MI at  age 27. Father also had history of heart problems.    REVIEW OF SYSTEMS: Negative except as mentioned in history of presenting  illness. All systems were reviewed, no other positive finding was noticed.    ALLERGIES: INCLUDE  1. CEPHALEXIN.  2. COMPAZINE.  3. IODINE.  4. PERCOCET.    MEDICATIONS PRIOR TO ADMISSION: Include.  1. Albuterol.  2. Amlodipine 10 mg daily.  3. Nexium 20 mg 2 tablets daily.  4. Hydrochlorothiazide.  5. Metoprolol XL 100 mg daily.    REVIEW OF SYSTEM:  Negative for all systems reviewed, no other positive  finding was noticed.    PHYSICAL EXAMINATION  GENERAL: The patient is alert, oriented, not in any acute distress, resting  comfortably.  VITAL SIGNS: Reveal temperature of 98.4, blood pressure is 135/83, pulse is  62, respiratory rate is 18.  HEENT: Examination reveals pupils equally reacting to light and  accommodation.  NECK: Supple. There is no lymphadenopathy or JVD.  CHEST: Clear.  CARDIOVASCULAR: S1 and S2 regular. No murmur. No S3.  ABDOMEN: Does not reveal any organomegaly. No guarding, no rigidity. Bowel  sounds are active.  EXTREMITIES: No pedal edema. Good peripheral pulses. No cyanosis or  clubbing.  CNS: Nonfocal.    LABORATORY WORK: White count of 3.8, hemoglobin of 12, hematocrit 36.8,  platelet count is 190,000. Chemistry revealed sodium of 140, potassium is  4.3,  chloride 109, bicarbonate of 23, gap of 8, glucose 91, BUN is 10,  creatinine 0.42. Calcium is 9.1, bilirubin 0.5, protein 7.7, albumin is  3.8, globulin is 3.9. CPK is 206, MB is 1.5, index is 3.7, troponin is less  than 0.04.    EKG shows normal sinus rhythm, rate of 73 beats per minute. The patient has  T-wave inversion in V3, V4, and V5. Chest x-ray reveals no acute process.    ASSESSMENT AND PLAN  1. The patient is admitted for further evaluation of chest pain. She has a  history of Prinzmetal angina. We will ask cardiologist to see.She will probably need stress test if she rules out.  2. Do serial cardiac enzymes.  3. Start her on nitroglycerin paste.  4. Check lipids.  5. Add Aspirin.        Reviewed on 05/24/2012 5:02 AM                Jaci Lazier, MD    cc:                       Jaci Lazier, MD      SFK/wmx; D: 05/23/2012 08:22 P; T: 05/23/2012 09:58 P; DOC# [id number]; Job#  [id number]

## 2012-05-23 NOTE — ED Notes (Signed)
Left side chest pain radiating to left shoulder and left jaw.  + SOB, + nausea, + sweating.  Hx angina.

## 2012-05-23 NOTE — Progress Notes (Signed)
I discussed earlier w Dr Harrie Jeans  Need date and place of prior cardiac cath to get report  If more than 48 years old may need stress test in am  Ecg with nonspecific T Wave inversion and troponin is negative over 3 hrs so unlikely to have nstemi

## 2012-05-23 NOTE — ED Provider Notes (Signed)
HPI Comments: 47 year old female with history of hypertension, reflux, fundoplication 2 months ago for reflux, Prinzmetal angina was asked to the ER complaining of chest pressure and tightness similar to her prior angina. She did not have her nitroglycerin. The pain does radiate to her arm. She does have some mild nausea. She was diagnosed with this in [place]. She currently does not smoke. There is no family history premature coronary disease. She denies he chart chest pain back pain or leg pain and has no history of DVT. Her pain was rated 6/10.    Patient is a 47 y.o. female presenting with chest pain and nausea.   Chest Pain (Angina)   Associated symptoms include nausea. Pertinent negatives include no abdominal pain, no back pain, no fever and no shortness of breath.   Nausea   Pertinent negatives include no chills, no fever and no abdominal pain.        Past Medical History   Diagnosis Date   ??? Hypertension    ??? GERD (gastroesophageal reflux disease)    ??? Arthritis    ??? Asthma    ??? Anginal pain         Past Surgical History   Procedure Laterality Date   ??? Hx heart catheterization     ??? Hx hysterectomy     ??? Hx gyn       endometrial oblation   ??? Hx polypectomy       bladder neck   ??? Hx knee arthroscopy           History reviewed. No pertinent family history.     History     Social History   ??? Marital Status: SINGLE     Spouse Name: N/A     Number of Children: N/A   ??? Years of Education: N/A     Occupational History   ??? Not on file.     Social History Main Topics   ??? Smoking status: Never Smoker    ??? Smokeless tobacco: Not on file   ??? Alcohol Use: Yes      Comment: occasional   ??? Drug Use: No   ??? Sexually Active:      Other Topics Concern   ??? Not on file     Social History Narrative   ??? No narrative on file                  ALLERGIES: Cephalexin; Compazine; Iodine; and Percocet      Review of Systems   Constitutional: Negative for fever and chills.   HENT: Negative for neck pain.    Eyes: Negative for  pain.   Respiratory: Positive for chest tightness. Negative for shortness of breath.    Cardiovascular: Positive for chest pain.   Gastrointestinal: Positive for nausea. Negative for abdominal pain.   Genitourinary: Negative for flank pain.   Musculoskeletal: Negative for back pain.   All other systems reviewed and are negative.        Filed Vitals:    05/23/12 1549 05/23/12 1652 05/23/12 1716   BP: 122/77 135/75 135/75   Pulse: 76 70 72   Temp: 98.4 ??F (36.9 ??C)     Resp: 18  16   Height: 5\' 9"  (1.753 m)     Weight: 112.038 kg (247 lb)     SpO2: 98%  97%            Physical Exam   Nursing note and vitals reviewed.  Constitutional: She  appears well-developed and well-nourished. No distress.   HENT:   Head: Normocephalic and atraumatic.   Eyes: Conjunctivae are normal. Pupils are equal, round, and reactive to light.   Neck: Normal range of motion. Neck supple. No JVD present.   Cardiovascular: Normal rate and regular rhythm.  Exam reveals no gallop and no friction rub.    No murmur heard.  Pulmonary/Chest: Effort normal and breath sounds normal. No respiratory distress. She has no wheezes. She has no rales. She exhibits no tenderness.   Abdominal: Soft. Bowel sounds are normal. She exhibits no distension and no mass. There is no tenderness. There is no rebound and no guarding.   Musculoskeletal: She exhibits no edema.   Neurological: She is alert.        MDM     Differential Diagnosis; Clinical Impression; Plan:     Differential diagnosis-unstable angina, stable angina, MI, PE, aneurysm , Referred GI pain.Clinical impression/plan-patient presents complaining of chest tightness similar to her prior Prinzmetal angina. She did not have nitroglycerin. She is from [place] and none of her test are available to compare to. He responded to nitroglycerin. We'll call cardiology was made aware of her and recommends inpatient stress test and discharge in the morning. He recommends admission to the hospitalist service. The  patient is in agreement with this plan. Her  Amount and/or Complexity of Data Reviewed:   Clinical lab tests:  Ordered and reviewed  Tests in the radiology section of CPT??:  Ordered and reviewed  Tests in the medicine section of the CPT??:  Ordered and reviewed  Discussion of test results with the performing providers:  Yes   Decide to obtain previous medical records or to obtain history from someone other than the patient:  Yes   Obtain history from someone other than the patient:  Yes   Review and summarize past medical records:  Yes   Discuss the patient with another provider:  Yes   Independant visualization of image, tracing, or specimen:  Yes  Risk of Significant Complications, Morbidity, and/or Mortality:   Presenting problems:  Moderate  Diagnostic procedures:  Low  Management options:  Low  Progress:   Patient progress:  Stable and improved      Procedures    6:10 PM-progress patient states that her pain improved. Talked to cardiologist Dr. Loma Newton he feels that the patient sets of enzymes she can be admitted to the hospitalist for stress test in the morning.    7:59 PMContinued to be pain-free. Based on her blood work she will be admitted to the hospital service and have a stress test in the morning.

## 2012-05-24 LAB — EKG, 12 LEAD, INITIAL
Atrial Rate: 73 {beats}/min
Calculated P Axis: 60 degrees
Calculated R Axis: 45 degrees
Calculated T Axis: 11 degrees
P-R Interval: 164 ms
Q-T Interval: 408 ms
QRS Duration: 90 ms
QTC Calculation (Bezet): 449 ms
Ventricular Rate: 73 {beats}/min

## 2012-05-24 LAB — TROPONIN I
Troponin-I, Qt.: <0.04 ng/mL (ref <0.05)
Troponin-I, Qt.: <0.04 ng/mL (ref <0.05)

## 2012-05-24 LAB — CK W/ CKMB & INDEX
CK - MB: 1.5 NG/ML (ref 0.5–3.6)
CK-MB Index: 0.7 (ref 0–2.5)
CK: 206 U/L — ABNORMAL HIGH (ref 26–192)

## 2012-05-24 LAB — LIPID PANEL
CHOL/HDL Ratio: 2.8 (ref 0–5.0)
Cholesterol, total: 157 MG/DL (ref <200)
HDL Cholesterol: 57 MG/DL
LDL, calculated: 86 MG/DL (ref 0–100)
Triglyceride: 70 MG/DL (ref <150)
VLDL, calculated: 14 MG/DL

## 2012-05-24 LAB — STRESS TEST LEXISCAN
ECG Interp. Before Exercise: NORMAL
Max. Diastolic BP: 92 mmHg
Max. Heart rate: 88 {beats}/min
Max. Systolic BP: 164 mmHg
Peak Ex METs: 1 METS

## 2012-05-24 MED ADMIN — nitroglycerin (NITROBID) 2 % ointment 1 Inch: TOPICAL | @ 16:00:00 | NDC 00281032608

## 2012-05-24 MED ADMIN — acetaminophen (TYLENOL) tablet 650 mg: ORAL | @ 03:00:00 | NDC 50580050130

## 2012-05-24 MED ADMIN — nitroglycerin (NITROBID) 2 % ointment 1 Inch: TOPICAL | @ 03:00:00 | NDC 00281032608

## 2012-05-24 MED ADMIN — enoxaparin (LOVENOX) injection 40 mg: SUBCUTANEOUS | @ 03:00:00 | NDC 00075801401

## 2012-05-24 MED ADMIN — hydrochlorothiazide (HYDRODIURIL) tablet 25 mg: ORAL | @ 04:00:00 | NDC 00172208380

## 2012-05-24 MED ADMIN — saline peripheral flush soln 20 mL: @ 15:00:00 | NDC 87701099893

## 2012-05-24 MED ADMIN — acetaminophen (TYLENOL) tablet 650 mg: ORAL | @ 11:00:00 | NDC 50580050130

## 2012-05-24 MED ADMIN — aspirin delayed-release tablet 81 mg: ORAL | @ 18:00:00 | NDC 00603002622

## 2012-05-24 MED ADMIN — regadenoson (LEXISCAN) injection 0.4 mg: INTRAVENOUS | @ 15:00:00 | NDC 00469650189

## 2012-05-24 MED ADMIN — citalopram (CELEXA) tablet 20 mg: ORAL | @ 18:00:00 | NDC 60505251903

## 2012-05-24 MED ADMIN — nitroglycerin (NITROBID) 2 % ointment 1 Inch: TOPICAL | @ 22:00:00 | NDC 00281032608

## 2012-05-24 MED ADMIN — metoprolol succinate (TOPROL-XL) XL tablet 100 mg: ORAL | @ 04:00:00 | NDC 68084030411

## 2012-05-24 MED ADMIN — ondansetron (ZOFRAN) injection 4 mg: INTRAVENOUS | @ 16:00:00 | NDC 00409475503

## 2012-05-24 MED ADMIN — amLODIPine (NORVASC) tablet 10 mg: ORAL | @ 04:00:00 | NDC 59762153006

## 2012-05-24 MED ADMIN — acetaminophen (TYLENOL) tablet 650 mg: ORAL | @ 18:00:00 | NDC 50580050130

## 2012-05-24 NOTE — Discharge Summary (Signed)
Discharge Summary       PATIENT ID: Diane Lambert  MRN: [id number]   DATE OF BIRTH: [birth year]    DATE OF ADMISSION: 05/23/2012  4:14 PM    DATE OF DISCHARGE: 05/24/12  PRIMARY CARE PROVIDER: Lovie Chol, MD       DISCHARGING PHYSICIAN: Alba Cory, NP    To contact this individual call [phone] and ask the operator to page.  If unavailable ask to be transferred the Adult Hospitalist Department.    CONSULTATIONS: IP CONSULT TO CARDIOLOGY  IP CONSULT TO CARDIOLOGY  IP CONSULT TO HOSPITALIST    PROCEDURES/SURGERIES: * No surgery found *    ADMITTING DIAGNOSES & HOSPITAL COURSE:     Ms. Papandrea is a 47 year old female who was driving when she began to experience chest pain that was described as "tight" and pressure-like.  The pain spread to her left shoulder and arm and up into her jaw.  She became short of breath and felt nauseous with sweating.  There is cardiac family history with her mother dying of an MI at the age of 53.  She said the pain was 6/10 and increasing.  She did not have nitroglycerin so she kept driving to the ER.    EKG - NSR, T wave abnormality, consider anterior ischemia  CXR - negative, no acute process  D-dimer - negative  Troponins x 3 - negative  Nuclear stress test with lexican cardiolite - 1.  No definite evidence of ischemia and/or infarction.  2. LVEF equals 55%. Left ventricular wall motion and thickening is normal.    She was given a prescription for nitroglycerin SL and discharged to home.  At the time of discharge, she denied CP, SOB, palpitations, headache, nausea.    PENDING TEST RESULTS:   At the time of discharge the following test results are still pending: none    FOLLOW UP APPOINTMENTS:    Follow-up Information    Follow up With Details Comments Contact Info    Lovie Chol, MD Schedule an appointment as soon as possible for a visit in 1 week  [address]  [place] [address]  [phone]             ADDITIONAL CARE RECOMMENDATIONS:     1.  You may return to work on  Monday.    2.  Take 1 (0.3 mg) tablet of nitroglycerin under your tongue, for chest pain.  You may do this for 2 more doses (a total of 3 doses).  If chest pain is not relieved, call 911 or go immediately to the emergency room.      DIET: Cardiac Diet    ACTIVITY: activity as tolerated      DISCHARGE MEDICATIONS:  Current Discharge Medication List      START taking these medications    Details   nitroglycerin (NITROSTAT) 0.3 mg SL tablet Take 1 tab (0.3 mg) under the tongue, repeat every 5 minutes, times 3 doses only, for chest pain.  Qty: 1 Bottle, Refills: 1         CONTINUE these medications which have NOT CHANGED    Details   hydrochlorothiazide (HYDRODIURIL) 25 mg tablet Take 25 mg by mouth nightly.      amLODIPine (NORVASC) 10 mg tablet Take 10 mg by mouth nightly.      metoprolol succinate (TOPROL-XL) 100 mg XL tablet Take 100 mg by mouth nightly.      therapeutic multivitamin [facility]) tablet Take  1 Tab by mouth daily.      aspirin delayed-release 81 mg tablet Take 81 mg by mouth daily.      citalopram (CELEXA) 20 mg tablet Take 20 mg by mouth daily.      albuterol (PROVENTIL HFA, VENTOLIN HFA) 90 mcg/actuation inhaler Take 2 Puffs by inhalation every four (4) hours as needed.         STOP taking these medications       promethazine (PHENERGAN) 25 mg tablet Comments:   Reason for Stopping:                 NOTIFY YOUR PHYSICIAN FOR ANY OF THE FOLLOWING:   Fever over 101 degrees for 24 hours.   Chest pain, shortness of breath, fever, chills, nausea, vomiting, diarrhea, change in mentation, falling, weakness, bleeding. Severe pain or pain not relieved by medications.  Or, any other signs or symptoms that you may have questions about.    DISPOSITION:  x  Home With:   OT  PT  HH  RN       Long term SNF/Inpatient Rehab    Independent/assisted living    Hospice    Other:       PATIENT CONDITION AT DISCHARGE:     Functional status    Poor     Deconditioned    x Independent      Cognition   x  Lucid     Forgetful      Dementia      Catheters/lines (plus indication)    Foley     PICC     PEG    x None      Code status   x  Full code     DNR      PHYSICAL EXAMINATION AT DISCHARGE:    GENERAL: Alert, oriented, not in any acute distress, resting   comfortably.   HEENT: PERRL, EOMs intact, oral mucosa moist  NECK: Supple. There is no lymphadenopathy or JVD.   CHEST: Clear.   CARDIOVASCULAR: S1 and S2 regular. No murmur. No S3.   ABDOMEN: Does not reveal any organomegaly. No guarding, no rigidity. Bowel   sounds are active.   EXTREMITIES: No pedal edema. Good peripheral pulses. No cyanosis or   clubbing.   CNS: Nonfocal.       CHRONIC MEDICAL DIAGNOSES:  Problem List as of 05/25/12 Date Reviewed: May 25, 2012        Codes Class Noted - Resolved    *RESOLVED: Chest pain 786.50  05/23/2012 - 2012/05/25              Greater than 20 minutes were spent with the patient on counseling and coordination of care    Signed:   Alba Cory, NP  05-25-12  6:05 PM

## 2012-05-24 NOTE — ED Notes (Signed)
Discharge instructions given to pt.  All questions answered and pt verbalized understanding.   V/S stable @ time of discharge.  Pt ambulatory out of unit.

## 2012-05-24 NOTE — Progress Notes (Signed)
Cardiology Consultation Note     Subjective:      Diane Lambert came to ER because of chest pain with radiation to left arm and diaphoresis  She parked her car and walked to the office when this happened, she had to climb steps and this has been hard for her with her arthritis  She lives in [place] and works in [place]  She moved here from [place] a year ago and last Nov she had Nissen fundoplication and she said she did not have pain like this at all  She had cardiac cath for chest pain in [place] in 2000 and was told normal  She has hypertension and takes BP medications, she said it is difficult to control so 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 Chest pain 786.50     Current Facility-Administered Medications   Medication Dose Route Frequency Provider Last Rate Last Dose   ??? [COMPLETED] aspirin chewable tablet 162 mg  162 mg Oral NOW Jule Ser, MD   162 mg at 05/23/12 1652   ??? [COMPLETED] nitroglycerin (NITROSTAT) tablet 0.4 mg  0.4 mg SubLINGual NOW Jule Ser, MD   0.4 mg at 05/23/12 1652   ??? amLODIPine (NORVASC) tablet 10 mg  10 mg Oral QHS Carrolyn Meiers, MD   10 mg at 05/23/12 2319   ??? hydrochlorothiazide (HYDRODIURIL) tablet 25 mg  25 mg Oral QHS Carrolyn Meiers, MD   25 mg at 05/23/12 2320   ??? metoprolol succinate (TOPROL-XL) XL tablet 100 mg  100 mg Oral QHS Carrolyn Meiers, MD   100 mg at 05/23/12 2319   ??? albuterol (PROVENTIL HFA, VENTOLIN HFA) inhaler 2 Puff  2 Puff Inhalation Q4H PRN Carrolyn Meiers, MD       ??? nitroglycerin (NITROBID) 2 % ointment 1 Inch  1 Inch Topical BID Carrolyn Meiers, MD   1 Inch at 05/23/12 2159   ??? acetaminophen (TYLENOL) tablet 650 mg  650 mg Oral Q6H PRN Carrolyn Meiers, MD   650 mg at 05/24/12 0540   ??? [DISCONTINUED] enoxaparin (LOVENOX) injection 40 mg  40 mg SubCUTAneous Q24H Carrolyn Meiers, MD   40 mg at 05/23/12 2158      Current Outpatient Prescriptions   Medication Sig Dispense Refill   ??? amLODIPine (NORVASC) 10 mg tablet Take 10 mg by mouth nightly.       ??? Hydrochlorothiazide 12.5 mg tablet Take 25 mg by mouth nightly.       ??? metoprolol succinate (TOPROL-XL) 100 mg XL tablet Take 100 mg by mouth nightly.       ??? therapeutic multivitamin (THERAGRAN) tablet Take 1 Tab by mouth daily.       ??? aspirin delayed-release 81 mg tablet Take 81 mg by mouth daily.       ??? citalopram (CELEXA) 20 mg tablet Take 20 mg by mouth daily.       ??? promethazine (PHENERGAN) 25 mg tablet Take 25 mg by mouth every six (6) hours as needed for Nausea.       ??? albuterol (PROVENTIL HFA, VENTOLIN HFA) 90 mcg/actuation inhaler Take 2 Puffs by inhalation every four (4) hours as needed.         Allergies   Allergen Reactions   ??? Cephalexin Hives   ??? Compazine (Prochlorperazine Edisylate) Other (comments)  Pt request   ??? Iodine Hives   ??? Lisinopril Cough     Pt states she gags and has a dry cough   ??? Percocet (Oxycodone-Acetaminophen) Itching     Past Medical History   Diagnosis Date   ??? Hypertension    ??? GERD (gastroesophageal reflux disease)    ??? Arthritis    ??? Asthma    ??? Anginal pain      Past Surgical History   Procedure Laterality Date   ??? Hx heart catheterization     ??? Hx hysterectomy     ??? Hx gyn       endometrial oblation   ??? Hx polypectomy       bladder neck   ??? Hx knee arthroscopy       History reviewed. + MI and CAD in family history (father with MI at age 37 and mother with a stent recently)  History   Substance Use Topics   ??? Smoking status: Never Smoker    ??? Smokeless tobacco: Not on file   ??? Alcohol Use: Yes      Comment: occasional        Review of Systems:   Constitutional: Negative for fever, chills, weight loss,+  malaise/fatigue.   HEENT: Negative for nosebleeds, vision changes.   Respiratory: Negative for cough, hemoptysis, sputum production, and wheezing.   Cardiovascular: Negative for palpitations, orthopnea, claudication, leg  swelling, syncope, and PND.   Gastrointestinal: Negative for nausea, vomiting, diarrhea,  blood in stool and melena.   Genitourinary: Negative for dysuria, and hematuria.   Musculoskeletal: Negative for myalgias, + arthralgia.   Skin: Negative for rash.   Heme: Does not bleed or bruise easily.   Neurological: Negative for speech change and focal weakness     Objective:     BP 156/82   Pulse 69   Temp(Src) 98.2 ??F (36.8 ??C)   Resp 14   Ht 5\' 9"  (1.753 m)   Wt 112.038 kg (247 lb)   BMI 36.46 kg/m2   SpO2 99%   Physical Exam:   Constitutional: well-developed and well-nourished. No distress.   Head: Normocephalic and atraumatic.   Eyes: Pupils are equal, round  Neck: supple. No JVD present.   Cardiovascular: Normal rate, regular rhythm and normal heart sounds.  Exam reveals no gallop and no friction rub.  No murmur heard.  Pulmonary/Chest: Effort normal and breath sounds normal. No wheezes.   Abdominal: Soft, obese  Musculoskeletal: no edema.   Neurological: alert,oriented.   Skin: Skin is warm and dry  Psychiatric: normal mood and affect. Behavior is normal. Judgment and thought content normal.      EKG: normal sinus rhythm, nonspecificT waves changes/inversions (small T waves).       Assessment/Plan:   Typical angina  Hypertension  + family hx of CAD and MI  She has been ruled out of MI  I recommend her to have stress test today and she wants to avoid exercise because of arthralgia  I will order lexican cardiolite, she may need 2 days testing because of her weight so cardiac cath would be recommended if the test is abnormal  She had this before and agrees to proceed  She understands that she may have to stay another night if she has late test and need cardiac cath and stent  Continue with aspirin, BP control. I will hold lovenox in case she needs cath  Lipid is acceptable range    Thank you for involving me in  this patient's care and please call with further concerns or questions.      Juliet Rude, M.D.   Electrophysiology/Cardiology  [facility]  [address]                      [address] [place], [place] [address]                             [place] [address]  [phone]                                        [phone]

## 2012-05-24 NOTE — Discharge Summary (Signed)
Please see the more detailed note from the NP.  Patient was seen and examined.  I agree with NP's assessment and plan.Discussed with the pt. About stress test results.Recomended to f/u PCP and cardiology out pt.pt. Have clear understanding.

## 2012-05-24 NOTE — Progress Notes (Signed)
Please see the more detailed note from the NP.  Patient was seen and examined.  I agree with NP's assessment and plan.

## 2012-05-24 NOTE — Progress Notes (Signed)
CLINICALLY REVIEWED FOR MEDICAL NECESSITY. SW TO F/U WITH DISCHARGE PLANNING. Merla Riches RN CM

## 2012-05-25 NOTE — Telephone Encounter (Signed)
Verified patient identity with two identifiers. Spoke with patient and set up ER fu appt time/date/location and mailed appt card to verified address.

## 2012-06-01 NOTE — Telephone Encounter (Signed)
Please call pt. To r/s her [facility], [facility], Hospital Discharge, 20 min.(03:20 PM) she got called in to work. #[phone]
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
## 2012-06-04 NOTE — ED Notes (Signed)
Patient read discharge instructions. Verbalized understanding.  Home with family.
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
## 2012-06-05 NOTE — ED Provider Notes (Signed)
I was personally available for consultation in the emergency department.  I have reviewed the chart and agree with the documentation recorded by the MLP, including the assessment, treatment plan, and disposition.  Faythe Heitzenrater M Breuna Loveall, MD

## 2012-06-05 NOTE — Telephone Encounter (Signed)
Verified patient with two patient identifiers.  Spoke with patient and r/s appt.  Pt verbalized understanding.

## 2012-06-08 NOTE — Telephone Encounter (Signed)
Pt.unable to make today appt with dr.ngo has worked 16 hrs. Can you please call her and get her reschd. Dr.ngo schd is way out and she states she needs to be seen.   [phone] after 1pm

## 2012-07-04 NOTE — Progress Notes (Signed)
No show

## 2012-11-22 ENCOUNTER — Encounter: Payer: Self-pay | Admitting: Internal Medicine

## 2013-01-09 ENCOUNTER — Encounter: Payer: BC Managed Care – PPO | Admitting: Internal Medicine

## 2013-01-31 ENCOUNTER — Encounter: Payer: Self-pay | Admitting: Internal Medicine

## 2013-02-07 ENCOUNTER — Other Ambulatory Visit: Payer: Self-pay

## 2013-04-08 ENCOUNTER — Encounter: Payer: Self-pay | Admitting: Internal Medicine

## 2013-04-22 ENCOUNTER — Ambulatory Visit (AMBULATORY_SURGERY_CENTER): Payer: BC Managed Care – PPO

## 2013-04-22 VITALS — Ht 69.5 in | Wt 254.0 lb

## 2013-04-22 DIAGNOSIS — R1310 Dysphagia, unspecified: Secondary | ICD-10-CM

## 2013-05-07 ENCOUNTER — Encounter: Payer: Self-pay | Admitting: Internal Medicine

## 2013-05-07 ENCOUNTER — Ambulatory Visit (AMBULATORY_SURGERY_CENTER): Payer: BC Managed Care – PPO | Admitting: Internal Medicine

## 2013-05-07 VITALS — BP 153/93 | HR 59 | Temp 97.9°F | Resp 12 | Ht 69.0 in | Wt 254.0 lb

## 2013-05-07 DIAGNOSIS — K2270 Barrett's esophagus without dysplasia: Secondary | ICD-10-CM

## 2013-05-07 DIAGNOSIS — R1310 Dysphagia, unspecified: Secondary | ICD-10-CM

## 2013-05-07 DIAGNOSIS — K2970 Gastritis, unspecified, without bleeding: Secondary | ICD-10-CM

## 2013-05-07 DIAGNOSIS — Z9889 Other specified postprocedural states: Secondary | ICD-10-CM

## 2013-05-07 DIAGNOSIS — K209 Esophagitis, unspecified without bleeding: Secondary | ICD-10-CM

## 2013-05-07 DIAGNOSIS — K2960 Other gastritis without bleeding: Secondary | ICD-10-CM

## 2013-05-07 DIAGNOSIS — K219 Gastro-esophageal reflux disease without esophagitis: Secondary | ICD-10-CM

## 2013-05-07 DIAGNOSIS — K222 Esophageal obstruction: Secondary | ICD-10-CM

## 2013-05-07 DIAGNOSIS — K2990 Gastroduodenitis, unspecified, without bleeding: Secondary | ICD-10-CM

## 2013-05-07 DIAGNOSIS — Z09 Encounter for follow-up examination after completed treatment for conditions other than malignant neoplasm: Secondary | ICD-10-CM

## 2013-05-07 MED ORDER — OMEPRAZOLE 40 MG PO CPDR: 40.0000 mg | DELAYED_RELEASE_CAPSULE | Freq: Every day | ORAL | Status: DC

## 2013-05-07 MED ORDER — SODIUM CHLORIDE 0.9 % IV SOLN: 500.0000 mL | INTRAVENOUS | Status: DC

## 2013-05-07 NOTE — Progress Notes (Signed)
Called to room to assist during endoscopic procedure.  Patient ID and intended procedure confirmed with present staff. Received instructions for my participation in the procedure from the performing physician.  

## 2013-05-07 NOTE — Op Note (Signed)
[facility]  [address] [place], [id number]   ENDOSCOPY PROCEDURE REPORT  PATIENT: Leslie Martinez, Leslie Martinez  MR#: [id number] BIRTHDATE: [birth year] , 56  yrs. old GENDER: Female ENDOSCOPIST: Lafayette Dragon, MD REFERRED BY:  Johnathan Hausen, M.D. PROCEDURE DATE:  05/07/2013 PROCEDURE:  EGD w/ biopsy and Savary dilation of esophagus ASA CLASS:     Class II INDICATIONS:  history of Barrett's esophagus.   Dysphagia.   history of Barrett's esophagus in 2009, 2011 and 2012.  No Barrett's in 2013.  History of esophageal stricture status post dilatation. Patient underwent Nissen fundoplication in October 9379 with symptomatic relief of reflux. MEDICATIONS: MAC sedation, administered by CRNA and propofol (Diprivan) 250mg  IV TOPICAL ANESTHETIC: Cetacaine Spray  DESCRIPTION OF PROCEDURE: After the risks benefits and alternatives of the procedure were thoroughly explained, informed consent was obtained.  The LB [id number] endoscope was introduced through the mouth and advanced to the second portion of the duodenum. Without limitations.  The instrument was slowly withdrawn as the mucosa was fully examined.      esophagus: Proximal half of the esophagus was normal. There was a grade 2 esophagitis long erosions measuring at least 1 cm. There was increased fibrous tissue and benign-appearing distal esophageal stricture allowed the endoscope to traverse into hiatal hernia biopsies were taken from the stricture and from the esophageal erosions to followup on Barrett's esophagus Stomach: There was a 2-3 cm sliding hiatal hernia which was not completely reducible. Gastric binder was normal. Retroflexion of the endoscope confirmed the presence of a Nissan fundoplication,. Gastric antrum showed a deep erosions and ulcerations on the surface of the falls which are edematous. There was also coffee ground specks in the gastric antrum on top of the erosions. Gastric outlet was  open Duodenum duodenal bulb and descending duodenum was normal Savary dilators were used to dilate the esophageal stricture starting with 15 and following with 16 mm dilators which passed over the guidewire without difficulty. There was blood on the second dilator[          The scope was then withdrawn from the patient and the procedure completed.  COMPLICATIONS: There were no complications. ENDOSCOPIC IMPRESSION: 1.functioning Nissen fundoplication 2. Mild distal esophageal stricture. Status post dilation to 16 mm 3. Erosive gastritis status post biopsies 4. grade 2 erosive esophagitis status post biopsies 5. History of Barrett's esophagus. Status post biopsies RECOMMENDATIONS:  antireflux measures Repeat upper endoscopy pending pathology results Continue on PPI REPEAT EXAM: pending biopsy results  eSigned:  Lafayette Dragon, MD 05/07/2013 4:14 PM   CC:  PATIENT NAME:  Leslie Martinez, Leslie Martinez MR#: [id number]

## 2013-05-07 NOTE — Patient Instructions (Signed)
YOU HAD AN ENDOSCOPIC PROCEDURE TODAY AT [facility]: Refer to the procedure report that was given to you for any specific questions about what was found during the examination.  If the procedure report does not answer your questions, please call your gastroenterologist to clarify.  If you requested that your care partner not be given the details of your procedure findings, then the procedure report has been included in a sealed envelope for you to review at your convenience later.  YOU SHOULD EXPECT: Some feelings of bloating in the abdomen. Passage of more gas than usual.  Walking can help get rid of the air that was put into your GI tract during the procedure and reduce the bloating. If you had a lower endoscopy (such as a colonoscopy or flexible sigmoidoscopy) you may notice spotting of blood in your stool or on the toilet paper. If you underwent a bowel prep for your procedure, then you may not have a normal bowel movement for a few days.  DIET:  You can't have anything to drink or eat until 5pm.  If that is tolderated, you may proceed to a soft diet for tonight.  Back to a regular diet tomorrow. ACTIVITY: Your care partner should take you home directly after the procedure.  You should plan to take it easy, moving slowly for the rest of the day.  You can resume normal activity the day after the procedure however you should NOT DRIVE or use heavy machinery for 24 hours (because of the sedation medicines used during the test).    SYMPTOMS TO REPORT IMMEDIATELY: A gastroenterologist can be reached at any hour.  During normal business hours, 8:30 AM to 5:00 PM Monday through Friday, call [phone].  After hours and on weekends, please call the GI answering service at [phone] who will take a message and have the physician on call contact you.   Following upper endoscopy (EGD)  Vomiting of blood or coffee ground material  New chest pain or pain under the shoulder  blades  Painful or persistently difficult swallowing  New shortness of breath  Fever of 100F or higher  Black, tarry-looking stools  FOLLOW UP: If any biopsies were taken you will be contacted by phone or by letter within the next 1-3 weeks.  Call your gastroenterologist if you have not heard about the biopsies in 3 weeks.  Our staff will call the home number listed on your records the next business day following your procedure to check on you and address any questions or concerns that you may have at that time regarding the information given to you following your procedure. This is a courtesy call and so if there is no answer at the home number and we have not heard from you through the emergency physician on call, we will assume that you have returned to your regular daily activities without incident.  SIGNATURES/CONFIDENTIALITY: You and/or your care partner have signed paperwork which will be entered into your electronic medical record.  These signatures attest to the fact that that the information above on your After Visit Summary has been reviewed and is understood.  Full responsibility of the confidentiality of this discharge information lies with you and/or your care-partner.

## 2013-05-07 NOTE — Progress Notes (Signed)
Prilosec script sent to patient.

## 2013-05-07 NOTE — Progress Notes (Signed)
Report to pacu rn, vss, bbs=clear 

## 2013-05-08 ENCOUNTER — Telehealth: Payer: Self-pay | Admitting: *Deleted

## 2013-05-08 NOTE — Telephone Encounter (Signed)
  Follow up Call-  Call back number 05/07/2013 11/02/2011 01/06/2011  Post procedure Call Back phone  # [phone]  Permission to leave phone message - Yes -     Patient questions:  Do you have a fever, pain , or abdominal swelling? no Pain Score  0 *  Have you tolerated food without any problems? yes  Have you been able to return to your normal activities? yes  Do you have any questions about your discharge instructions: Diet   no Medications  no Follow up visit  no  Do you have questions or concerns about your Care? no  Actions: * If pain score is 4 or above: No action needed, pain <4.  Pt c/o sore throat, advised to drink some warm liquids to help with soreness but if pain increased to call us back-adm

## 2013-05-13 ENCOUNTER — Encounter: Payer: Self-pay | Admitting: Internal Medicine

## 2013-05-14 ENCOUNTER — Encounter: Payer: Self-pay | Admitting: *Deleted

## 2014-05-27 ENCOUNTER — Inpatient Hospital Stay
Admit: 2014-05-27 | Discharge: 2014-05-27 | Disposition: A | Discharge status: Home / Self Care | Payer: BLUE CROSS/BLUE SHIELD | Attending: Emergency Medicine

## 2014-05-27 DIAGNOSIS — R0789 Other chest pain: Secondary | ICD-10-CM

## 2014-05-27 LAB — CBC WITH AUTOMATED DIFF
ABS. BASOPHILS: 0 10*3/uL (ref 0.0–0.1)
ABS. EOSINOPHILS: 0.1 10*3/uL (ref 0.0–0.4)
ABS. LYMPHOCYTES: 2.3 10*3/uL (ref 0.8–3.5)
ABS. MONOCYTES: 0.4 10*3/uL (ref 0.0–1.0)
ABS. NEUTROPHILS: 1.9 10*3/uL (ref 1.8–8.0)
BASOPHILS: 0 % (ref 0–1)
EOSINOPHILS: 2 % (ref 0–7)
HCT: 38.1 % (ref 35.0–47.0)
HGB: 12.4 g/dL (ref 11.5–16.0)
LYMPHOCYTES: 49 % (ref 12–49)
MCH: 27.3 PG (ref 26.0–34.0)
MCHC: 32.5 g/dL (ref 30.0–36.5)
MCV: 83.9 FL (ref 80.0–99.0)
MONOCYTES: 8 % (ref 5–13)
NEUTROPHILS: 41 % (ref 32–75)
PLATELET: 158 10*3/uL (ref 150–400)
RBC: 4.54 M/uL (ref 3.80–5.20)
RDW: 13.6 % (ref 11.5–14.5)
WBC: 4.6 10*3/uL (ref 3.6–11.0)

## 2014-05-27 LAB — EKG, 12 LEAD, INITIAL
Atrial Rate: 59 {beats}/min
Calculated P Axis: 50 degrees
Calculated R Axis: 50 degrees
Calculated T Axis: 9 degrees
P-R Interval: 172 ms
Q-T Interval: 452 ms
QRS Duration: 88 ms
QTC Calculation (Bezet): 447 ms
Ventricular Rate: 59 {beats}/min

## 2014-05-27 LAB — METABOLIC PANEL, BASIC
Anion gap: 9 mmol/L (ref 5–15)
BUN/Creatinine ratio: 16 (ref 12–20)
BUN: 10 MG/DL (ref 6–20)
CO2: 26 mmol/L (ref 21–32)
Calcium: 8.5 MG/DL (ref 8.5–10.1)
Chloride: 106 mmol/L (ref 97–108)
Creatinine: 0.63 MG/DL (ref 0.55–1.02)
GFR est AA: >60 mL/min/{1.73_m2} (ref >60)
GFR est non-AA: >60 mL/min/{1.73_m2} (ref >60)
Glucose: 93 mg/dL (ref 65–100)
Potassium: 3.6 mmol/L (ref 3.5–5.1)
Sodium: 141 mmol/L (ref 136–145)

## 2014-05-27 LAB — POC TROPONIN-I
Troponin-I (POC): <0.04 ng/mL (ref 0.00–0.08)
Troponin-I (POC): <0.04 ng/mL (ref 0.00–0.08)

## 2014-05-27 LAB — MAGNESIUM: Magnesium: 1.9 mg/dL (ref 1.6–2.4)

## 2014-05-27 MED ORDER — NIFEDIPINE 10 MG CAP
10 mg | ORAL | Status: AC
Start: 2014-05-27 — End: 2014-05-27
  Administered 2014-05-27: 16:00:00 via ORAL

## 2014-05-27 MED ORDER — ACETAMINOPHEN 325 MG TABLET
325 mg | ORAL | Status: AC
Start: 2014-05-27 — End: 2014-05-27
  Administered 2014-05-27: 18:00:00 via ORAL

## 2014-05-27 MED ORDER — SODIUM CHLORIDE 0.9 % IJ SYRG
Freq: Three times a day (TID) | INTRAMUSCULAR | Status: DC
Start: 2014-05-27 — End: 2014-05-27

## 2014-05-27 MED ORDER — SODIUM CHLORIDE 0.9 % IJ SYRG
INTRAMUSCULAR | Status: DC | PRN
Start: 2014-05-27 — End: 2014-05-27

## 2014-05-27 MED ORDER — NITROGLYCERIN 2 % TRANSDERMAL OINTMENT
2 % | TRANSDERMAL | Status: AC
Start: 2014-05-27 — End: 2014-05-27
  Administered 2014-05-27: 16:00:00 via TOPICAL

## 2014-05-27 MED ORDER — ACETAMINOPHEN 325 MG TABLET
325 mg | ORAL | Status: AC
Start: 2014-05-27 — End: 2014-05-27

## 2014-05-27 MED FILL — NIFEDIPINE 10 MG CAP: 10 mg | ORAL | Qty: 1

## 2014-05-27 MED FILL — BD POSIFLUSH NORMAL SALINE 0.9 % INJECTION SYRINGE: INTRAMUSCULAR | Qty: 10

## 2014-05-27 MED FILL — ACETAMINOPHEN 325 MG TABLET: 325 mg | ORAL | Qty: 2

## 2014-05-27 MED FILL — NITRO-BID 2 % TRANSDERMAL OINTMENT: 2 % | TRANSDERMAL | Qty: 1

## 2014-05-27 NOTE — ED Notes (Signed)
Bedside shift change report given to Okey Regalarol (Cabin crewoncoming nurse) by Donnamarie Poagjeanne (offgoing nurse). Report included the following information SBAR, MAR and Med Rec Status.

## 2014-05-27 NOTE — ED Notes (Signed)
Dr. Samuella CotaPandya reviewed discharge instructions with patient per AVS summary.  Patient verbalized understanding of instructions and agrees to follow-up care as recommended by provider.  Patient in no apparent distress and vital signs are within normal limits with reassessment of pain noted under 'vital signs' area of chart.   Patient ambuylated out of ED accompanied by friend.

## 2014-05-27 NOTE — ED Provider Notes (Signed)
HPI Comments: 49 y.o. female with past medical history significant for hypertension and Prinzmetal angina who presents with chief complaint of chest pain.  Patient reports she was seated, speaking with a patient when she had onset of left-sided chest pressure radiating into left shoulder and back.  Pain began 1.5 hours ago and was associated with shortness of breath, diaphoresis, and nausea.  Patient took ASA and 3 Nitroglycerin with some relief and now complains of headache from the Nitro.  Patient reports a history of similar symptoms several times before, has been admitted, and was evaluated by a cardiologist in [place].  Patient takes her Metoprolol in the evening.  Patient denies fever, recent illness.  There are no other acute medical concerns at this time.  Social hx: Nonsmoker.  PCP: Lovie Chol, MD    Note written by Jake Church. Alben Spittle, Scribe, as dictated by Daniel Nones, MD 10:28 AM      The history is provided by the patient.        Past Medical History:   Diagnosis Date   ??? Hypertension    ??? GERD (gastroesophageal reflux disease)    ??? Arthritis    ??? Asthma    ??? Anginal pain (HCC)        Past Surgical History:   Procedure Laterality Date   ??? Hx heart catheterization     ??? Hx hysterectomy     ??? Hx gyn       endometrial oblation   ??? Hx polypectomy       bladder neck   ??? Hx knee arthroscopy           No family history on file.    History     Social History   ??? Marital Status: SINGLE     Spouse Name: N/A   ??? Number of Children: N/A   ??? Years of Education: N/A     Occupational History   ??? Not on file.     Social History Main Topics   ??? Smoking status: Never Smoker    ??? Smokeless tobacco: Not on file   ??? Alcohol Use: Yes      Comment: occasional   ??? Drug Use: No   ??? Sexual Activity: Not on file     Other Topics Concern   ??? Not on file     Social History Narrative   ??? No narrative on file           ALLERGIES: Cephalexin; Compazine; Iodine; Lisinopril; and Percocet      Review of Systems    Constitutional: Positive for diaphoresis. Negative for fever, appetite change and unexpected weight change.   HENT: Negative.  Negative for ear pain, hearing loss, nosebleeds, rhinorrhea, sore throat and trouble swallowing.    Eyes: Negative.  Negative for pain and redness.   Respiratory: Positive for shortness of breath. Negative for cough and chest tightness.    Cardiovascular: Positive for chest pain. Negative for palpitations.   Gastrointestinal: Positive for nausea. Negative for vomiting, abdominal pain, blood in stool and abdominal distention.   Endocrine: Negative.  Negative for cold intolerance and heat intolerance.   Genitourinary: Negative for dysuria and hematuria.   Musculoskeletal: Negative.  Negative for myalgias and back pain.   Skin: Negative.  Negative for pallor and rash.   Allergic/Immunologic: Negative.  Negative for food allergies and immunocompromised state.   Neurological: Negative.  Negative for dizziness, syncope, weakness and numbness.   Hematological: Negative.  Negative for  adenopathy. Does not bruise/bleed easily.   Psychiatric/Behavioral: Negative.  Negative for confusion and agitation.   All other systems reviewed and are negative.      Filed Vitals:    05/27/14 1022   BP: 199/95   Pulse: 68   Temp: 97.7 ??F (36.5 ??C)   Resp: 16   Height: 5\' 9"  (1.753 m)   Weight: 115.214 kg (254 lb)   SpO2: 100%            Physical Exam   Constitutional: She is oriented to person, place, and time. She appears well-developed and well-nourished. No distress.   HENT:   Head: Normocephalic and atraumatic.   Right Ear: External ear normal.   Left Ear: External ear normal.   Nose: Nose normal.   Mouth/Throat: Oropharynx is clear and moist.   Eyes: Conjunctivae and EOM are normal. Pupils are equal, round, and reactive to light.   Neck: Normal range of motion. Neck supple. No JVD present. No thyromegaly present.   Cardiovascular: Normal rate, regular rhythm, normal heart sounds and intact distal pulses.     No murmur heard.  Pulmonary/Chest: Effort normal and breath sounds normal. No respiratory distress. She has no wheezes. She has no rales.   Abdominal: Soft. Bowel sounds are normal. She exhibits no distension. There is no tenderness.   Musculoskeletal: Normal range of motion. She exhibits no edema.   Neurological: She is alert and oriented to person, place, and time. No cranial nerve deficit.   Skin: Skin is warm and dry. No rash noted.   Psychiatric: She has a normal mood and affect. Her behavior is normal. Thought content normal.   Nursing note and vitals reviewed.  Note written by Leotis ShamesLauren A. Alben SpittleWeaver, Neurosurgeoncribe, as dictated by Daniel NonesParas Sakina Briones, MD 10:26 AM       MDM    Procedures      10:18 AM ED EKG interpretation:  Rhythm: sinus bradycardia; and regular . Rate (approx.): 59; Intervals: normal; ST/T wave: normal; no ectopy.  Note written by Leotis ShamesLauren A. Alben SpittleWeaver, Neurosurgeoncribe, as dictated by Daniel NonesParas Padraic Marinos, MD 10:25 AM    1:06 PM  Two sets of negative cardiac enzymes - plan to discharge home to follow up with Dr. Elmon KirschnerXenakis.

## 2014-05-27 NOTE — ED Notes (Signed)
Pain and pressure started at 0900 today. Facility gave 3 nitro and started a line. Hx of angina.

## 2014-05-27 NOTE — ED Notes (Signed)
Bedside and Verbal shift change report given to Mandy/Carol (oncoming nurse) by Donnamarie PoagJeanne (offgoing nurse). Report included the following information SBAR, ED Summary, MAR and Recent Results.

## 2014-06-13 ENCOUNTER — Encounter: Attending: Specialist | Primary: Family Medicine

## 2014-09-30 ENCOUNTER — Telehealth: Payer: Self-pay | Admitting: Internal Medicine

## 2014-09-30 NOTE — Telephone Encounter (Signed)
Spoke with patient and she reports increasing dysphagia especially with meat x 2 weeks. She is asking for dilation. She is not taking Omeprazole. Please, advise.

## 2014-09-30 NOTE — Telephone Encounter (Signed)
Last dil 05/2013- stricture, hx of Nissen Fundoplication. Please schedule direct EGD/dil. If she is interested, there is an opening  On Wed 10/01/2014 pm.

## 2014-10-01 ENCOUNTER — Other Ambulatory Visit: Payer: Self-pay | Admitting: *Deleted

## 2014-10-01 ENCOUNTER — Ambulatory Visit (AMBULATORY_SURGERY_CENTER): Payer: Self-pay

## 2014-10-01 VITALS — Ht 69.0 in | Wt 255.2 lb

## 2014-10-01 DIAGNOSIS — R1314 Dysphagia, pharyngoesophageal phase: Secondary | ICD-10-CM

## 2014-10-01 NOTE — Telephone Encounter (Signed)
Spoke with patient and scheduled EGD on 10/03/14 at 11:00 M and pre visit on 10/02/14 at 1:30 PM.

## 2014-10-01 NOTE — Progress Notes (Signed)
No allergies to eggs or soy No diet/weight loss meds No home oxygen No past problems with anesthesia  Doesn't want to see egd video/info; has had this procedure before.

## 2014-10-02 ENCOUNTER — Encounter: Payer: Self-pay | Admitting: Internal Medicine

## 2014-10-03 ENCOUNTER — Encounter: Payer: Self-pay | Admitting: Internal Medicine

## 2014-10-03 ENCOUNTER — Ambulatory Visit (AMBULATORY_SURGERY_CENTER): Payer: BC Managed Care – PPO | Admitting: Internal Medicine

## 2014-10-03 VITALS — BP 177/98 | HR 51 | Temp 98.1°F | Resp 18 | Ht 69.0 in | Wt 255.0 lb

## 2014-10-03 DIAGNOSIS — K222 Esophageal obstruction: Secondary | ICD-10-CM

## 2014-10-03 DIAGNOSIS — Z09 Encounter for follow-up examination after completed treatment for conditions other than malignant neoplasm: Secondary | ICD-10-CM | POA: Diagnosis not present

## 2014-10-03 DIAGNOSIS — K209 Esophagitis, unspecified without bleeding: Secondary | ICD-10-CM

## 2014-10-03 DIAGNOSIS — R1310 Dysphagia, unspecified: Secondary | ICD-10-CM

## 2014-10-03 DIAGNOSIS — R1319 Other dysphagia: Secondary | ICD-10-CM

## 2014-10-03 DIAGNOSIS — K210 Gastro-esophageal reflux disease with esophagitis: Secondary | ICD-10-CM | POA: Diagnosis not present

## 2014-10-03 DIAGNOSIS — R1314 Dysphagia, pharyngoesophageal phase: Secondary | ICD-10-CM | POA: Diagnosis not present

## 2014-10-03 DIAGNOSIS — Z9889 Other specified postprocedural states: Secondary | ICD-10-CM

## 2014-10-03 MED ORDER — SODIUM CHLORIDE 0.9 % IV SOLN: 500.0000 mL | INTRAVENOUS | Status: DC

## 2014-10-03 NOTE — Progress Notes (Signed)
Transferred to recovery room. A/O x3, pleased with MAC.  VSS.  Report to Suzanne, RN. 

## 2014-10-03 NOTE — Progress Notes (Signed)
Called to room to assist during endoscopic procedure.  Patient ID and intended procedure confirmed with present staff. Received instructions for my participation in the procedure from the performing physician.  

## 2014-10-03 NOTE — Op Note (Signed)
[facility]  [employer]. [place], [address]   ENDOSCOPY PROCEDURE REPORT  PATIENT: Leslie, Martinez  MR#: [id number] BIRTHDATE: [birth year] , 45  yrs. old GENDER: female ENDOSCOPIST: Lafayette Dragon, MD REFERRED BY:  Johnathan Hausen, M.D. PROCEDURE DATE:  10/03/2014 PROCEDURE:  EGD w/ biopsy and EGD w/ wire guided (savary) dilation  ASA CLASS:     Class II INDICATIONS:  dysphagia and History of gastroesophageal reflux. Nissen fundoplication in 8828.  Last endoscopy in February 2015 showed grade 2 esophagitis and 3 cm hiatal hernia.  History of Barrett's esophagus in 2009, 2011 and 2012.  Now recurrent dysphagia. MEDICATIONS: Monitored anesthesia care and Propofol 200 mg IV TOPICAL ANESTHETIC: none  DESCRIPTION OF PROCEDURE: After the risks benefits and alternatives of the procedure were thoroughly explained, informed consent was obtained.  The LB [id number] endoscope was introduced through the mouth and advanced to the second portion of the duodenum , Without limitations.  The instrument was slowly withdrawn as the mucosa was fully examined.    Esophagus: there was a Nissen fundoplication at the GE junction. There was a nonobstructing benign-appearing esophageal stricture[ ,there were at least 2 linear erosions in distal esophagus consistent with grade 2 esophagitis. There was a small 2-3 cm hiatal hernia distal to the stricture  Stomach:  gastric folds were normal gastric antrum and pyloric outlet was unremarkable. The endoscope confirmed presence of the Nissen fundoplication Duodenum: duodenal bulb and descending duodenum was normal  Savary dilator 15, 16 and 17 mm passed through the stricture without difficulty. All amount of blood on the first and third dilator The scope was then withdrawn from the patient and the procedure completed.  COMPLICATIONS: There were no immediate complications.  ENDOSCOPIC IMPRESSION: 1. mild recurrent distal  esophageal stricture. Status post dilation to 17 mm 2. Functioning Nissen fundoplication 3. Grade 2 esophagitis   RECOMMENDATIONS: 1.  Anti-reflux regimen to be follow 2.  Await pathology results 3.  Prilosec 20 mg 1 by mouth daily  REPEAT EXAM: for EGD pending biopsy results.  eSigned:  Lafayette Dragon, MD 10/03/2014 12:13 PM    CC:  PATIENT NAME:  Leslie, Martinez MR#: [id number]

## 2014-10-03 NOTE — Patient Instructions (Signed)
YOU HAD AN ENDOSCOPIC PROCEDURE TODAY AT [facility]:   Refer to the procedure report that was given to you for any specific questions about what was found during the examination.  If the procedure report does not answer your questions, please call your gastroenterologist to clarify.  If you requested that your care partner not be given the details of your procedure findings, then the procedure report has been included in a sealed envelope for you to review at your convenience later.  YOU SHOULD EXPECT: Some feelings of bloating in the abdomen. Passage of more gas than usual.  Walking can help get rid of the air that was put into your GI tract during the procedure and reduce the bloating. If you had a lower endoscopy (such as a colonoscopy or flexible sigmoidoscopy) you may notice spotting of blood in your stool or on the toilet paper. If you underwent a bowel prep for your procedure, you may not have a normal bowel movement for a few days.  Please Note:  You might notice some irritation and congestion in your nose or some drainage.  This is from the oxygen used during your procedure.  There is no need for concern and it should clear up in a day or so.  SYMPTOMS TO REPORT IMMEDIATELY:    Following upper endoscopy (EGD)  Vomiting of blood or coffee ground material  New chest pain or pain under the shoulder blades  Painful or persistently difficult swallowing  New shortness of breath  Fever of 100F or higher  Black, tarry-looking stools  For urgent or emergent issues, a gastroenterologist can be reached at any hour by calling [phone].   DIET: Clear liquids until 1pm. Then you may have a soft diet for the rest of today.  You may have a regular diet tomorrow.  Drink plenty of fluids but you should avoid alcoholic beverages for 24 hours.  ACTIVITY:  You should plan to take it easy for the rest of today and you should NOT DRIVE or use heavy machinery until tomorrow (because  of the sedation medicines used during the test).    FOLLOW UP: Our staff will call the number listed on your records the next business day following your procedure to check on you and address any questions or concerns that you may have regarding the information given to you following your procedure. If we do not reach you, we will leave a message.  However, if you are feeling well and you are not experiencing any problems, there is no need to return our call.  We will assume that you have returned to your regular daily activities without incident.  If any biopsies were taken you will be contacted by phone or by letter within the next 1-3 weeks.  Please call us at [phone] if you have not heard about the biopsies in 3 weeks.    SIGNATURES/CONFIDENTIALITY: You and/or your care partner have signed paperwork which will be entered into your electronic medical record.  These signatures attest to the fact that that the information above on your After Visit Summary has been reviewed and is understood.  Full responsibility of the confidentiality of this discharge information lies with you and/or your care-partner.  Be sure to take your prilosec every day per Dr. Olevia Perches. Read all of the handouts given to you by your recovery room nurse.

## 2014-10-07 ENCOUNTER — Telehealth: Payer: Self-pay

## 2014-10-07 NOTE — Telephone Encounter (Signed)
  Follow up Call-  Call back number 10/03/2014 05/07/2013  Post procedure Call Back phone  # [phone]  Permission to leave phone message Yes -     Patient questions:  Do you have a fever, pain , or abdominal swelling? No. Pain Score  0 *  Have you tolerated food without any problems? Yes.    Have you been able to return to your normal activities? Yes.    Do you have any questions about your discharge instructions: Diet   No. Medications  No. Follow up visit  No.  Do you have questions or concerns about your Care? No.  Actions: * If pain score is 4 or above: No action needed, pain <4.

## 2014-10-09 ENCOUNTER — Encounter: Payer: Self-pay | Admitting: Internal Medicine

## 2014-11-07 DIAGNOSIS — L281 Prurigo nodularis: Secondary | ICD-10-CM | POA: Insufficient documentation

## 2014-11-07 DIAGNOSIS — L669 Cicatricial alopecia, unspecified: Secondary | ICD-10-CM | POA: Insufficient documentation

## 2015-02-03 ENCOUNTER — Emergency Department (HOSPITAL_COMMUNITY)
Admission: EM | Admit: 2015-02-03 | Discharge: 2015-02-03 | Disposition: A | Discharge status: Home / Self Care | Payer: BC Managed Care – PPO | Attending: Emergency Medicine | Admitting: Emergency Medicine

## 2015-02-03 ENCOUNTER — Encounter (HOSPITAL_COMMUNITY): Payer: Self-pay | Admitting: Emergency Medicine

## 2015-02-03 DIAGNOSIS — R011 Cardiac murmur, unspecified: Secondary | ICD-10-CM | POA: Insufficient documentation

## 2015-02-03 DIAGNOSIS — R51 Headache: Secondary | ICD-10-CM | POA: Insufficient documentation

## 2015-02-03 DIAGNOSIS — J45909 Unspecified asthma, uncomplicated: Secondary | ICD-10-CM | POA: Insufficient documentation

## 2015-02-03 DIAGNOSIS — E119 Type 2 diabetes mellitus without complications: Secondary | ICD-10-CM | POA: Diagnosis not present

## 2015-02-03 DIAGNOSIS — L638 Other alopecia areata: Secondary | ICD-10-CM | POA: Diagnosis not present

## 2015-02-03 DIAGNOSIS — L739 Follicular disorder, unspecified: Secondary | ICD-10-CM | POA: Insufficient documentation

## 2015-02-03 DIAGNOSIS — R509 Fever, unspecified: Secondary | ICD-10-CM | POA: Diagnosis not present

## 2015-02-03 DIAGNOSIS — F329 Major depressive disorder, single episode, unspecified: Secondary | ICD-10-CM | POA: Diagnosis not present

## 2015-02-03 DIAGNOSIS — Z79899 Other long term (current) drug therapy: Secondary | ICD-10-CM | POA: Diagnosis not present

## 2015-02-03 DIAGNOSIS — Z8719 Personal history of other diseases of the digestive system: Secondary | ICD-10-CM | POA: Diagnosis not present

## 2015-02-03 DIAGNOSIS — R21 Rash and other nonspecific skin eruption: Secondary | ICD-10-CM | POA: Diagnosis not present

## 2015-02-03 DIAGNOSIS — Z87442 Personal history of urinary calculi: Secondary | ICD-10-CM | POA: Diagnosis not present

## 2015-02-03 DIAGNOSIS — I209 Angina pectoris, unspecified: Secondary | ICD-10-CM | POA: Insufficient documentation

## 2015-02-03 DIAGNOSIS — I10 Essential (primary) hypertension: Secondary | ICD-10-CM | POA: Insufficient documentation

## 2015-02-03 DIAGNOSIS — L089 Local infection of the skin and subcutaneous tissue, unspecified: Secondary | ICD-10-CM | POA: Diagnosis not present

## 2015-02-03 DIAGNOSIS — Z7982 Long term (current) use of aspirin: Secondary | ICD-10-CM | POA: Insufficient documentation

## 2015-02-03 MED ORDER — DOXYCYCLINE HYCLATE 100 MG PO TABS
100.0000 mg | ORAL_TABLET | Freq: Once | ORAL | Status: AC
  Administered 2015-02-03: 100 mg via ORAL
  Filled 2015-02-03: qty 1

## 2015-02-03 MED ORDER — HYDROCODONE-ACETAMINOPHEN 5-325 MG PO TABS: 1.0000 | ORAL_TABLET | ORAL | Status: DC | PRN

## 2015-02-03 MED ORDER — DOXYCYCLINE HYCLATE 100 MG PO CAPS: 100.0000 mg | ORAL_CAPSULE | Freq: Two times a day (BID) | ORAL | Status: DC

## 2015-02-03 NOTE — ED Provider Notes (Signed)
CSN: [id number]     Arrival date & time 02/03/15  0055 History   First MD Initiated Contact with Patient 02/03/15 0115     Chief Complaint  Patient presents with  . Folliculitis      (Consider location/radiation/quality/duration/timing/severity/associated sxs/prior Treatment) HPI Comments: Presents with scalp rash. 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 Repeat flare up yesterday. She is already on topical steroids. Presents with pain and pustules she feels needs further treatment based on previous history. She complains of subjective fevers. No nausea, vomiting.   The history is provided by the patient. No language interpreter was used.    Past Medical History  Diagnosis Date  . Depression   . Hypertension   . Angina pectoris ([place])   . Esophagitis   . Esophageal stricture   . Gastritis   . 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 Asthma   . Heart murmur   . Nephrolithiasis   . GERD (gastroesophageal reflux disease)   . Hiatal hernia   . Diabetes mellitus ([place])     no meds   Past Surgical History  Procedure Laterality Date  . Vaginal hysterectomy    . Cardiac catheterization    . Appendectomy    . Endometrial ablation    . Kidney polyps removed    . Dilation and curettage of uterus    . Knee arthroscopy    . Wisdom tooth extraction    . Cardiac catheterization    . Laparoscopic nissen fundoplication  63/11/9371    Procedure: LAPAROSCOPIC NISSEN FUNDOPLICATION;  Surgeon: Pedro Earls, MD;  Location: [facility];  Service: General;  Laterality: N/A;  . Upper gi endoscopy  02/03/2012    Procedure: UPPER GI ENDOSCOPY;  Surgeon: Pedro Earls, MD;  Location: [facility];  Service: General;;  . Colonoscopy     Family History  Problem Relation Age of Onset  . Heart disease Mother   . Heart disease Father   . Diabetes Father   . Colon cancer Maternal Grandmother    Social History  Substance Use Topics  . Smoking status: Never Smoker   .  Smokeless tobacco: Never Used  . Alcohol Use: Yes     Comment: Occasional   OB History    No data available     Review of Systems  Constitutional: Positive for fever and chills.  Gastrointestinal: Negative for nausea and vomiting.  Skin: Positive for rash.  Neurological: Positive for headaches.      Allergies  Cephalexin; Iodine; Lisinopril; Oxycodone-acetaminophen; and Prochlorperazine edisylate  Home Medications   Prior to Admission medications   Medication Sig Start Date End Date Taking? Authorizing Provider  amLODipine (NORVASC) 10 MG tablet Take 10 mg by mouth at bedtime.     Historical Provider, MD  aspirin 81 MG tablet Take 81 mg by mouth every morning.     Historical Provider, MD  citalopram (CELEXA) 10 MG tablet Take 20 mg by mouth daily.     Historical Provider, MD  metoprolol (TOPROL-XL) 100 MG 24 hr tablet Take 100 mg by mouth daily.     Historical Provider, MD  naproxen (NAPROSYN) 500 MG tablet Take 500 mg by mouth as needed (knee pain).     Historical Provider, MD   BP 185/105 mmHg  Pulse 71  Temp(Src) 98.9 F (37.2 C) (Oral)  Resp 16  Ht 5\' 9"  (1.753 m)  Wt 251 lb 4.8 oz (113.989 kg)  BMI 37.09 kg/m2  SpO2 100% Physical Exam  Constitutional: She  is oriented to person, place, and time. She appears well-developed and well-nourished.  Neck: Normal range of motion.  Pulmonary/Chest: Effort normal.  Neurological: She is alert and oriented to person, place, and time.  Skin: Skin is warm and dry.  Scalp - chronic central alopecia. There are scattered pinpoint pustules c/w folliculitis. Minimal redness of scalp. Significant tenderness.     ED Course  Procedures (including critical care time) Labs Review Labs Reviewed - No data to display  Imaging Review No results found. I have personally reviewed and evaluated these images and lab results as part of my medical decision-making.   EKG Interpretation None      MDM   Final diagnoses:  None    1.  Folliculitis  Will start on Doxycycline and provide pain relief. Encourage follow up with dermatology.    Charlann Lange, PA-C 02/03/15 [place], DO 02/03/15 9163

## 2015-02-03 NOTE — Discharge Instructions (Signed)
Folliculitis °Folliculitis is redness, soreness, and swelling (inflammation) of the hair follicles. This condition can occur anywhere on the body. People with weakened immune systems, diabetes, or obesity have a greater risk of getting folliculitis. °CAUSES °· Bacterial infection. This is the most common cause. °· Fungal infection. °· Viral infection. °· Contact with certain chemicals, especially oils and tars. °Long-term folliculitis can result from bacteria that live in the nostrils. The bacteria may trigger multiple outbreaks of folliculitis over time. °SYMPTOMS °Folliculitis most commonly occurs on the scalp, thighs, legs, back, buttocks, and areas where hair is shaved frequently. An early sign of folliculitis is a small, white or yellow, pus-filled, itchy lesion (pustule). These lesions appear on a red, inflamed follicle. They are usually less than 0.2 inches (5 mm) wide. When there is an infection of the follicle that goes deeper, it becomes a boil or furuncle. A group of closely packed boils creates a larger lesion (carbuncle). Carbuncles tend to occur in hairy, sweaty areas of the body. °DIAGNOSIS  °Your caregiver can usually tell what is wrong by doing a physical exam. A sample may be taken from one of the lesions and tested in a lab. This can help determine what is causing your folliculitis. °TREATMENT  °Treatment may include: °· Applying warm compresses to the affected areas. °· Taking antibiotic medicines orally or applying them to the skin. °· Draining the lesions if they contain a large amount of pus or fluid. °· Laser hair removal for cases of long-lasting folliculitis. This helps to prevent regrowth of the hair. °HOME CARE INSTRUCTIONS °· Apply warm compresses to the affected areas as directed by your caregiver. °· If antibiotics are prescribed, take them as directed. Finish them even if you start to feel better. °· You may take over-the-counter medicines to relieve itching. °· Do not shave irritated  skin. °· Follow up with your caregiver as directed. °SEEK IMMEDIATE MEDICAL CARE IF:  °· You have increasing redness, swelling, or pain in the affected area. °· You have a fever. °MAKE SURE YOU: °· Understand these instructions. °· Will watch your condition. °· Will get help right away if you are not doing well or get worse. °  °This information is not intended to replace advice given to you by your health care provider. Make sure you discuss any questions you have with your health care provider. °  °Document Released: 05/30/2001 Document Revised: 04/11/2014 Document Reviewed: 06/21/2011 °Elsevier Interactive Patient Education ©2016 [employer] ° °

## 2015-02-03 NOTE — ED Notes (Signed)
Pt reports folliculitis all over scalp which is very tender. Pt c.o swollen lymph nodes and body aches as well

## 2015-06-05 ENCOUNTER — Other Ambulatory Visit: Payer: Self-pay | Admitting: Orthopedic Surgery

## 2015-07-03 ENCOUNTER — Ambulatory Visit (HOSPITAL_COMMUNITY)
Admission: RE | Admit: 2015-07-03 | Discharge: 2015-07-03 | Disposition: A | Discharge status: Home / Self Care | Payer: BC Managed Care – PPO | Source: Ambulatory Visit | Attending: Orthopedic Surgery | Admitting: Orthopedic Surgery

## 2015-07-03 ENCOUNTER — Encounter (HOSPITAL_COMMUNITY)
Admission: RE | Admit: 2015-07-03 | Discharge: 2015-07-03 | Disposition: A | Discharge status: Home / Self Care | Payer: BC Managed Care – PPO | Source: Ambulatory Visit | Attending: Orthopedic Surgery | Admitting: Orthopedic Surgery

## 2015-07-03 ENCOUNTER — Encounter (HOSPITAL_COMMUNITY): Payer: Self-pay

## 2015-07-03 DIAGNOSIS — Z01810 Encounter for preprocedural cardiovascular examination: Secondary | ICD-10-CM | POA: Diagnosis not present

## 2015-07-03 DIAGNOSIS — R9431 Abnormal electrocardiogram [ECG] [EKG]: Secondary | ICD-10-CM | POA: Insufficient documentation

## 2015-07-03 DIAGNOSIS — Z01812 Encounter for preprocedural laboratory examination: Secondary | ICD-10-CM | POA: Insufficient documentation

## 2015-07-03 DIAGNOSIS — I10 Essential (primary) hypertension: Secondary | ICD-10-CM | POA: Insufficient documentation

## 2015-07-03 DIAGNOSIS — Z01818 Encounter for other preprocedural examination: Secondary | ICD-10-CM | POA: Diagnosis not present

## 2015-07-03 LAB — URINALYSIS, ROUTINE W REFLEX MICROSCOPIC
Bilirubin Urine: NEGATIVE
Glucose, UA: NEGATIVE mg/dL
Ketones, ur: NEGATIVE mg/dL
Leukocytes, UA: NEGATIVE
NITRITE: NEGATIVE
Protein, ur: NEGATIVE mg/dL
SPECIFIC GRAVITY, URINE: 1.025 (ref 1.005–1.030)
pH: 5.5 (ref 5.0–8.0)

## 2015-07-03 LAB — PROTIME-INR
INR: 1.15 (ref 0.00–1.49)
Prothrombin Time: 14.8 seconds (ref 11.6–15.2)

## 2015-07-03 LAB — BASIC METABOLIC PANEL
Anion gap: 7 (ref 5–15)
BUN: 10 mg/dL (ref 6–20)
CALCIUM: 9.4 mg/dL (ref 8.9–10.3)
CO2: 24 mmol/L (ref 22–32)
CREATININE: 0.82 mg/dL (ref 0.44–1.00)
Chloride: 109 mmol/L (ref 101–111)
GFR calc Af Amer: >60 mL/min (ref >60)
GLUCOSE: 110 mg/dL — AB (ref 65–99)
POTASSIUM: 3.7 mmol/L (ref 3.5–5.1)
Sodium: 140 mmol/L (ref 135–145)

## 2015-07-03 LAB — URINE MICROSCOPIC-ADD ON

## 2015-07-03 LAB — CBC WITH DIFFERENTIAL/PLATELET
BASOS ABS: 0 10*3/uL (ref 0.0–0.1)
Basophils Relative: 0 %
EOS PCT: 1 %
Eosinophils Absolute: 0 10*3/uL (ref 0.0–0.7)
HCT: 35.7 % — ABNORMAL LOW (ref 36.0–46.0)
Hemoglobin: 11.6 g/dL — ABNORMAL LOW (ref 12.0–15.0)
LYMPHS PCT: 31 %
Lymphs Abs: 1.3 10*3/uL (ref 0.7–4.0)
MCH: 26.9 pg (ref 26.0–34.0)
MCHC: 32.5 g/dL (ref 30.0–36.0)
MCV: 82.8 fL (ref 78.0–100.0)
MONO ABS: 0.3 10*3/uL (ref 0.1–1.0)
Monocytes Relative: 7 %
Neutro Abs: 2.5 10*3/uL (ref 1.7–7.7)
Neutrophils Relative %: 61 %
PLATELETS: 145 10*3/uL — AB (ref 150–400)
RBC: 4.31 MIL/uL (ref 3.87–5.11)
RDW: 13.5 % (ref 11.5–15.5)
WBC: 4.2 10*3/uL (ref 4.0–10.5)

## 2015-07-03 LAB — SURGICAL PCR SCREEN
MRSA, PCR: NEGATIVE
Staphylococcus aureus: NEGATIVE

## 2015-07-03 LAB — APTT: APTT: 29 s (ref 24–37)

## 2015-07-03 LAB — TYPE AND SCREEN
ABO/RH(D): O POS
Antibody Screen: NEGATIVE

## 2015-07-03 LAB — ABO/RH: ABO/RH(D): O POS

## 2015-07-03 NOTE — Progress Notes (Signed)
Pt. Reports that she had cardiac stress/echo 2015- in [place] due to chest pain that occurred on her job.  All was well, told that the pain probably resulted due to stress related environment. Release form signed & will fax request for records to Dr. Lenon Curt, cardiologist in [place], [place]

## 2015-07-03 NOTE — Progress Notes (Signed)
Pt. with dry cough today, reports that she has allergy problem at present. Pt. Denies all flulike symptoms.

## 2015-07-03 NOTE — Pre-Procedure Instructions (Signed)
Leslie Martinez  07/03/2015      [facility] [address], [place]  [address] Phone: [phone] Fax: [phone]    Your procedure is scheduled on 07/13/2015  Report to [facility] Admitting at 5:30  A.M.  Call this number if you have problems the morning of surgery:  [phone]   Remember:  Do not eat food or drink liquids after midnight.  On Sunday NIGHT   Take these medicines the morning of surgery with A SIP OF WATER : Prilosec              STOP ANTIINFLAMMATORY, ASPIRIN after 07/08/2015   Do not wear jewelry, make-up or nail polish.   Do not wear lotions, powders, or perfumes.  You may wear deodorant.   Do not shave 48 hours prior to surgery.    Do not bring valuables to the hospital.  [facility] is not responsible for any belongings or valuables.  Contacts, dentures or bridgework may not be worn into surgery.  Leave your suitcase in the car.  After surgery it may be brought to your room.  For patients admitted to the hospital, discharge time will be determined by your treatment team.  Patients discharged the day of surgery will not be allowed to drive home.   Name and phone number of your driver:   /w family  Special instructions:  Special Instructions: [facility] - Preparing for Surgery  Before surgery, you can play an important role.  Because skin is not sterile, your skin needs to be as free of germs as possible.  You can reduce the number of germs on you skin by washing with CHG (chlorahexidine gluconate) soap before surgery.  CHG is an antiseptic cleaner which kills germs and bonds with the skin to continue killing germs even after washing.  Please DO NOT use if you have an allergy to CHG or antibacterial soaps.  If your skin becomes reddened/irritated stop using the CHG and inform your nurse when you arrive at Short Stay.  Do not shave  (including legs and underarms) for at least 48 hours prior to the first CHG shower.  You may shave your face.  Please follow these instructions carefully:   1.  Shower with CHG Soap the night before surgery and the  morning of Surgery.  2.  If you choose to wash your hair, wash your hair first as usual with your  normal shampoo.  3.  After you shampoo, rinse your hair and body thoroughly to remove the  Shampoo.  4.  Use CHG as you would any other liquid soap.  You can apply chg directly to the skin and wash gently with scrungie or a clean washcloth.  5.  Apply the CHG Soap to your body ONLY FROM THE NECK DOWN.    Do not use on open wounds or open sores.  Avoid contact with your eyes, ears, mouth and genitals (private parts).  Wash genitals (private parts)   with your normal soap.  6.  Wash thoroughly, paying special attention to the area where your surgery will be performed.  7.  Thoroughly rinse your body with warm water from the neck down.  8.  DO NOT shower/wash with your normal soap after using and rinsing off   the CHG Soap.  9.  Pat yourself dry with a clean towel.  10.  Wear clean pajamas.            11.  Place clean sheets on your bed the night of your first shower and do not sleep with pets.  Day of Surgery  Do not apply any lotions/deodorants the morning of surgery.  Please wear clean clothes to the hospital/surgery center.  Please read over the following fact sheets that you were given. Pain Booklet, Coughing and Deep Breathing, Blood Transfusion Information, MRSA Information and Surgical Site Infection Prevention

## 2015-07-06 NOTE — Progress Notes (Signed)
Re-requested records from [facility] [phone]

## 2015-07-08 NOTE — Progress Notes (Signed)
Re-requested cardiac info from [facility].Per staff no stress test on file and EKG is >1 yr ago. Will send Echo and last OV

## 2015-07-09 NOTE — H&P (Signed)
TOTAL KNEE ADMISSION H&P  Patient is being admitted for left total knee arthroplasty.  Subjective:  Chief Complaint:left knee pain.  HPI: Leslie Martinez, 50 y.o. female, has a history of pain and functional disability in the left knee due to arthritis and has failed non-surgical conservative treatments for greater than 12 weeks to includeNSAID's and/or analgesics, corticosteriod injections, viscosupplementation injections and activity modification.  Onset of symptoms was gradual, starting several years ago with gradually worsening course since that time. The patient noted prior procedures on the knee to include  arthroscopy on the left knee(s).  Patient currently rates pain in the left knee(s) at 10 out of 10 with activity. Patient has night pain, worsening of pain with activity and weight bearing, pain that interferes with activities of daily living, pain with passive range of motion, crepitus and joint swelling.  Patient has evidence of joint space narrowing by imaging studies.   There is no active infection.  Patient Active Problem List   Diagnosis Date Noted  . Lap Nissen Nov 2013 02/06/2012  . HYPERTENSION 01/09/2008  . 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 ASTHMA 01/09/2008  . BARRETT'S ESOPHAGUS, HX OF 01/09/2008  . NEPHROLITHIASIS, HX OF 01/09/2008  . DYSPHAGIA UNSPECIFIED 11/26/2007   Past Medical History  Diagnosis Date  . Depression   . Hypertension   . Angina pectoris ([place])   . Esophagitis   . Esophageal stricture   . Gastritis   . 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 GERD (gastroesophageal reflux disease)   . Asthma     seasonal allergies, dry cough today   . Nephrolithiasis     passed stones spontaneously, also cystoscopy- done   . Arthritis     knees- OA  . Hiatal hernia     history of, treated with surgery    Past Surgical History  Procedure Laterality Date  . Vaginal hysterectomy    . Appendectomy    . Endometrial ablation    . Kidney polyps removed    . Dilation and  curettage of uterus    . Knee arthroscopy Bilateral   . Wisdom tooth extraction    . Laparoscopic nissen fundoplication  [id number]    Procedure: LAPAROSCOPIC NISSEN FUNDOPLICATION;  Surgeon: Pedro Earls, MD;  Location: [facility];  Service: General;  Laterality: N/A;  . Upper gi endoscopy  02/03/2012    Procedure: UPPER GI ENDOSCOPY;  Surgeon: Pedro Earls, MD;  Location: [facility];  Service: General;;  . Colonoscopy    . Tonsillectomy  2004    T&A- due to sleep apnea   . Cardiac catheterization  1990's  . Cardiac catheterization    . Tubal ligation      No prescriptions prior to admission   Allergies  Allergen Reactions  . Adhesive [Tape] Hives  . Cephalexin     REACTION: rash  . Iodine     REACTION: rash  . Latex Itching  . Lisinopril Other (See Comments)    cough  . Oxycodone-Acetaminophen     REACTION: itching  . Prochlorperazine Edisylate     REACTION: hyper    Social History  Substance Use Topics  . Smoking status: Never Smoker   . Smokeless tobacco: Never Used  . Alcohol Use: Yes     Comment: Occasional- socially    Family History  Problem Relation Age of Onset  . Heart disease Mother   . Heart disease Father   . Diabetes Father   . Colon cancer Maternal Grandmother  Review of Systems  HENT:       Sinus problems  Eyes:       Poor vision  Cardiovascular: Positive for leg swelling.       HTN, Irregular heart beats  Gastrointestinal: Positive for heartburn, nausea and diarrhea.  Genitourinary: Positive for hematuria.  Musculoskeletal: Positive for joint pain.  Neurological: Positive for headaches.  Endo/Heme/Allergies:       Blood sugar problem and use of steroids.  Hot flashes  Psychiatric/Behavioral: Positive for depression. The patient has insomnia.     Objective:  Physical Exam  Constitutional: She is oriented to person, place, and time. She appears well-developed and well-nourished.  HENT:  Head: Normocephalic and atraumatic.  Eyes:  Pupils are equal, round, and reactive to light.  Neck: Normal range of motion. Neck supple.  Cardiovascular: Intact distal pulses.   Respiratory: Effort normal.  Musculoskeletal: She exhibits tenderness.  The left knee has a 1+ effusion.  Patellofemoral apprehension sign is positive quad capture test is positive and there is impressive crepitus as you take her through range of motion.    Neurological: She is alert and oriented to person, place, and time.  Skin: Skin is warm and dry.  Psychiatric: She has a normal mood and affect. Her behavior is normal. Judgment and thought content normal.    Vital signs in last 24 hours:    Labs:   Estimated body mass index is 37.09 kg/(m^2) as calculated from the following:   Height as of 02/03/15: 5\' 9"  (1.753 m).   Weight as of 02/03/15: 113.989 kg (251 lb 4.8 oz).   Imaging Review Plain radiographs demonstrate bilateral AP weightbearing, bilateral Rosenberg, lateral sunrise views of bilateral knees are taken and reviewed in office today.  Patient does have medial compartment arthritis bilaterally.  She also has obvious patellofemoral arthritis bilaterally and questionable lucencies over the trochlear region bilaterally left worse than right.  This would be concerning for AVN.  Assessment/Plan:  End stage arthritis, left knee   The patient history, physical examination, clinical judgment of the provider and imaging studies are consistent with end stage degenerative joint disease of the left knee(s) and total knee arthroplasty is deemed medically necessary. The treatment options including medical management, injection therapy arthroscopy and arthroplasty were discussed at length. The risks and benefits of total knee arthroplasty were presented and reviewed. The risks due to aseptic loosening, infection, stiffness, patella tracking problems, thromboembolic complications and other imponderables were discussed. The patient acknowledged the explanation,  agreed to proceed with the plan and consent was signed. Patient is being admitted for inpatient treatment for surgery, pain control, PT, OT, prophylactic antibiotics, VTE prophylaxis, progressive ambulation and ADL's and discharge planning. The patient is planning to be discharged home with home health services

## 2015-07-10 MED ORDER — VANCOMYCIN HCL 10 G IV SOLR
1500.0000 mg | INTRAVENOUS | Status: AC
  Administered 2015-07-13 (×2): 1500 mg via INTRAVENOUS
  Filled 2015-07-10: qty 1500

## 2015-07-10 MED ORDER — DEXTROSE-NACL 5-0.45 % IV SOLN: INTRAVENOUS | Status: DC

## 2015-07-11 DIAGNOSIS — M1712 Unilateral primary osteoarthritis, left knee: Secondary | ICD-10-CM | POA: Diagnosis present

## 2015-07-13 ENCOUNTER — Encounter (HOSPITAL_COMMUNITY)
Admission: RE | Disposition: A | Discharge status: Skilled Nursing Facility | Payer: Self-pay | Source: Ambulatory Visit | Attending: Orthopedic Surgery

## 2015-07-13 ENCOUNTER — Inpatient Hospital Stay (HOSPITAL_COMMUNITY)
Admission: RE | Admit: 2015-07-13 | Discharge: 2015-07-16 | DRG: 470 | Disposition: A | Discharge status: Skilled Nursing Facility | Payer: BC Managed Care – PPO | Source: Ambulatory Visit | Attending: Orthopedic Surgery | Admitting: Orthopedic Surgery

## 2015-07-13 ENCOUNTER — Inpatient Hospital Stay (HOSPITAL_COMMUNITY): Payer: BC Managed Care – PPO | Admitting: Anesthesiology

## 2015-07-13 ENCOUNTER — Encounter (HOSPITAL_COMMUNITY): Payer: Self-pay | Admitting: Anesthesiology

## 2015-07-13 DIAGNOSIS — Z91040 Latex allergy status: Secondary | ICD-10-CM

## 2015-07-13 DIAGNOSIS — J45909 Unspecified asthma, uncomplicated: Secondary | ICD-10-CM | POA: Diagnosis present

## 2015-07-13 DIAGNOSIS — D62 Acute posthemorrhagic anemia: Secondary | ICD-10-CM | POA: Diagnosis not present

## 2015-07-13 DIAGNOSIS — Z8249 Family history of ischemic heart disease and other diseases of the circulatory system: Secondary | ICD-10-CM | POA: Diagnosis not present

## 2015-07-13 DIAGNOSIS — I10 Essential (primary) hypertension: Secondary | ICD-10-CM | POA: Diagnosis present

## 2015-07-13 DIAGNOSIS — F329 Major depressive disorder, single episode, unspecified: Secondary | ICD-10-CM | POA: Diagnosis present

## 2015-07-13 DIAGNOSIS — Z885 Allergy status to narcotic agent status: Secondary | ICD-10-CM

## 2015-07-13 DIAGNOSIS — Z791 Long term (current) use of non-steroidal anti-inflammatories (NSAID): Secondary | ICD-10-CM

## 2015-07-13 DIAGNOSIS — M1710 Unilateral primary osteoarthritis, unspecified knee: Secondary | ICD-10-CM | POA: Diagnosis present

## 2015-07-13 DIAGNOSIS — Z888 Allergy status to other drugs, medicaments and biological substances status: Secondary | ICD-10-CM | POA: Diagnosis not present

## 2015-07-13 DIAGNOSIS — M25562 Pain in left knee: Secondary | ICD-10-CM | POA: Diagnosis present

## 2015-07-13 DIAGNOSIS — Z886 Allergy status to analgesic agent status: Secondary | ICD-10-CM | POA: Diagnosis not present

## 2015-07-13 DIAGNOSIS — K2270 Barrett's esophagus without dysplasia: Secondary | ICD-10-CM | POA: Diagnosis present

## 2015-07-13 DIAGNOSIS — Z91041 Radiographic dye allergy status: Secondary | ICD-10-CM

## 2015-07-13 DIAGNOSIS — Z881 Allergy status to other antibiotic agents status: Secondary | ICD-10-CM | POA: Diagnosis not present

## 2015-07-13 DIAGNOSIS — K219 Gastro-esophageal reflux disease without esophagitis: Secondary | ICD-10-CM | POA: Diagnosis present

## 2015-07-13 DIAGNOSIS — G4700 Insomnia, unspecified: Secondary | ICD-10-CM | POA: Diagnosis present

## 2015-07-13 DIAGNOSIS — M879 Osteonecrosis, unspecified: Secondary | ICD-10-CM | POA: Diagnosis present

## 2015-07-13 DIAGNOSIS — M1712 Unilateral primary osteoarthritis, left knee: Secondary | ICD-10-CM | POA: Diagnosis present

## 2015-07-13 HISTORY — PX: TOTAL KNEE ARTHROPLASTY: SHX125

## 2015-07-13 SURGERY — ARTHROPLASTY, KNEE, TOTAL
Anesthesia: Spinal | Site: Knee | Laterality: Left

## 2015-07-13 MED ORDER — KCL IN DEXTROSE-NACL 20-5-0.45 MEQ/L-%-% IV SOLN
INTRAVENOUS | Status: DC
  Administered 2015-07-13 – 2015-07-14 (×3): via INTRAVENOUS
  Filled 2015-07-13 (×4): qty 1000

## 2015-07-13 MED ORDER — BUPIVACAINE HCL 0.5 % IJ SOLN
INTRAMUSCULAR | Status: DC | PRN
  Administered 2015-07-13: 20 mL

## 2015-07-13 MED ORDER — OXYCODONE-ACETAMINOPHEN 5-325 MG PO TABS: 1.0000 | ORAL_TABLET | ORAL | Status: DC | PRN

## 2015-07-13 MED ORDER — ACETAMINOPHEN 650 MG RE SUPP: 650.0000 mg | Freq: Four times a day (QID) | RECTAL | Status: DC | PRN

## 2015-07-13 MED ORDER — PANTOPRAZOLE SODIUM 40 MG PO TBEC
40.0000 mg | DELAYED_RELEASE_TABLET | Freq: Every day | ORAL | Status: DC
  Administered 2015-07-13 – 2015-07-16 (×4): 40 mg via ORAL
  Filled 2015-07-13 (×4): qty 1

## 2015-07-13 MED ORDER — METOCLOPRAMIDE HCL 5 MG/ML IJ SOLN: 5.0000 mg | Freq: Three times a day (TID) | INTRAMUSCULAR | Status: DC | PRN

## 2015-07-13 MED ORDER — LACTATED RINGERS IV SOLN
INTRAVENOUS | Status: DC
  Administered 2015-07-13: 07:00:00 via INTRAVENOUS

## 2015-07-13 MED ORDER — PROPOFOL 500 MG/50ML IV EMUL
INTRAVENOUS | Status: DC | PRN
  Administered 2015-07-13: 25 ug/kg/min via INTRAVENOUS

## 2015-07-13 MED ORDER — ACETAMINOPHEN 325 MG PO TABS: 650.0000 mg | ORAL_TABLET | Freq: Four times a day (QID) | ORAL | Status: DC | PRN

## 2015-07-13 MED ORDER — DULOXETINE HCL 60 MG PO CPEP
60.0000 mg | ORAL_CAPSULE | Freq: Every day | ORAL | Status: DC
  Administered 2015-07-13 – 2015-07-16 (×4): 60 mg via ORAL
  Filled 2015-07-13 (×4): qty 1

## 2015-07-13 MED ORDER — ONDANSETRON HCL 4 MG/2ML IJ SOLN
INTRAMUSCULAR | Status: DC | PRN
  Administered 2015-07-13: 4 mg via INTRAVENOUS

## 2015-07-13 MED ORDER — DIPHENHYDRAMINE HCL 12.5 MG/5ML PO ELIX
12.5000 mg | ORAL_SOLUTION | ORAL | Status: DC | PRN
  Administered 2015-07-13 – 2015-07-15 (×2): 25 mg via ORAL
  Filled 2015-07-13 (×2): qty 10

## 2015-07-13 MED ORDER — ONDANSETRON HCL 4 MG PO TABS: 4.0000 mg | ORAL_TABLET | Freq: Four times a day (QID) | ORAL | Status: DC | PRN

## 2015-07-13 MED ORDER — CEFUROXIME SODIUM 1.5 G IJ SOLR
INTRAMUSCULAR | Status: DC | PRN
  Administered 2015-07-13: 1.5 g

## 2015-07-13 MED ORDER — ROCURONIUM BROMIDE 50 MG/5ML IV SOLN
INTRAVENOUS | Status: AC
  Filled 2015-07-13: qty 1

## 2015-07-13 MED ORDER — METOPROLOL SUCCINATE ER 100 MG PO TB24
100.0000 mg | ORAL_TABLET | Freq: Every day | ORAL | Status: DC
  Administered 2015-07-13 – 2015-07-15 (×3): 100 mg via ORAL
  Filled 2015-07-13 (×3): qty 1

## 2015-07-13 MED ORDER — SODIUM CHLORIDE 0.9 % IV SOLN
INTRAVENOUS | Status: DC | PRN
  Administered 2015-07-13: 07:00:00 via INTRAVENOUS

## 2015-07-13 MED ORDER — SENNOSIDES-DOCUSATE SODIUM 8.6-50 MG PO TABS: 1.0000 | ORAL_TABLET | Freq: Every evening | ORAL | Status: DC | PRN

## 2015-07-13 MED ORDER — FENTANYL CITRATE (PF) 100 MCG/2ML IJ SOLN
INTRAMUSCULAR | Status: DC | PRN
  Administered 2015-07-13 (×5): 50 ug via INTRAVENOUS

## 2015-07-13 MED ORDER — BUPIVACAINE HCL (PF) 0.5 % IJ SOLN
INTRAMUSCULAR | Status: AC
  Filled 2015-07-13: qty 30

## 2015-07-13 MED ORDER — METHOCARBAMOL 1000 MG/10ML IJ SOLN
500.0000 mg | INTRAMUSCULAR | Status: AC
  Administered 2015-07-13: 500 mg via INTRAVENOUS
  Filled 2015-07-13: qty 5

## 2015-07-13 MED ORDER — ALBUTEROL SULFATE (2.5 MG/3ML) 0.083% IN NEBU: 3.0000 mL | INHALATION_SOLUTION | Freq: Four times a day (QID) | RESPIRATORY_TRACT | Status: DC | PRN

## 2015-07-13 MED ORDER — HYDROCHLOROTHIAZIDE 12.5 MG PO CAPS
12.5000 mg | ORAL_CAPSULE | Freq: Every day | ORAL | Status: DC
  Administered 2015-07-13 – 2015-07-16 (×4): 12.5 mg via ORAL
  Filled 2015-07-13 (×4): qty 1

## 2015-07-13 MED ORDER — CHLORHEXIDINE GLUCONATE 4 % EX LIQD: 60.0000 mL | Freq: Once | CUTANEOUS | Status: DC

## 2015-07-13 MED ORDER — LORATADINE 10 MG PO TABS: 10.0000 mg | ORAL_TABLET | Freq: Every day | ORAL | Status: DC

## 2015-07-13 MED ORDER — ASPIRIN EC 325 MG PO TBEC
325.0000 mg | DELAYED_RELEASE_TABLET | Freq: Every day | ORAL | Status: DC
  Administered 2015-07-14 – 2015-07-16 (×3): 325 mg via ORAL
  Filled 2015-07-13 (×3): qty 1

## 2015-07-13 MED ORDER — METHOCARBAMOL 500 MG PO TABS
500.0000 mg | ORAL_TABLET | Freq: Four times a day (QID) | ORAL | Status: DC | PRN
  Administered 2015-07-13 – 2015-07-16 (×10): 500 mg via ORAL
  Filled 2015-07-13 (×10): qty 1

## 2015-07-13 MED ORDER — TRANEXAMIC ACID 1000 MG/10ML IV SOLN
2000.0000 mg | Freq: Once | INTRAVENOUS | Status: AC
  Administered 2015-07-13: 2000 mg via TOPICAL
  Filled 2015-07-13: qty 20

## 2015-07-13 MED ORDER — DOCUSATE SODIUM 100 MG PO CAPS
100.0000 mg | ORAL_CAPSULE | Freq: Two times a day (BID) | ORAL | Status: DC
  Administered 2015-07-13 – 2015-07-16 (×6): 100 mg via ORAL
  Filled 2015-07-13 (×6): qty 1

## 2015-07-13 MED ORDER — METHOCARBAMOL 500 MG PO TABS: 500.0000 mg | ORAL_TABLET | Freq: Two times a day (BID) | ORAL | Status: DC

## 2015-07-13 MED ORDER — SODIUM CHLORIDE 0.9 % IR SOLN
Status: DC | PRN
  Administered 2015-07-13: 1000 mL

## 2015-07-13 MED ORDER — BUPIVACAINE LIPOSOME 1.3 % IJ SUSP
20.0000 mL | Freq: Once | INTRAMUSCULAR | Status: AC
  Administered 2015-07-13: 20 mL
  Filled 2015-07-13: qty 20

## 2015-07-13 MED ORDER — ONDANSETRON HCL 4 MG/2ML IJ SOLN
4.0000 mg | Freq: Four times a day (QID) | INTRAMUSCULAR | Status: DC | PRN
  Filled 2015-07-13: qty 2

## 2015-07-13 MED ORDER — FENTANYL CITRATE (PF) 100 MCG/2ML IJ SOLN
INTRAMUSCULAR | Status: AC
  Administered 2015-07-13: 50 ug via INTRAVENOUS
  Filled 2015-07-13: qty 2

## 2015-07-13 MED ORDER — PHENOL 1.4 % MT LIQD: 1.0000 | OROMUCOSAL | Status: DC | PRN

## 2015-07-13 MED ORDER — MIDAZOLAM HCL 2 MG/2ML IJ SOLN
INTRAMUSCULAR | Status: AC
  Filled 2015-07-13: qty 2

## 2015-07-13 MED ORDER — ONDANSETRON HCL 4 MG/2ML IJ SOLN: 4.0000 mg | Freq: Once | INTRAMUSCULAR | Status: DC | PRN

## 2015-07-13 MED ORDER — ALUM & MAG HYDROXIDE-SIMETH 200-200-20 MG/5ML PO SUSP: 30.0000 mL | ORAL | Status: DC | PRN

## 2015-07-13 MED ORDER — LIDOCAINE HCL (CARDIAC) 20 MG/ML IV SOLN
INTRAVENOUS | Status: AC
  Filled 2015-07-13: qty 5

## 2015-07-13 MED ORDER — CEFUROXIME SODIUM 1.5 G IJ SOLR
INTRAMUSCULAR | Status: AC
  Filled 2015-07-13: qty 1.5

## 2015-07-13 MED ORDER — MIDAZOLAM HCL 5 MG/5ML IJ SOLN
INTRAMUSCULAR | Status: DC | PRN
Start: 2015-07-13 — End: 2015-07-13
  Administered 2015-07-13 (×2): 1 mg via INTRAVENOUS

## 2015-07-13 MED ORDER — ASPIRIN EC 325 MG PO TBEC: 325.0000 mg | DELAYED_RELEASE_TABLET | Freq: Two times a day (BID) | ORAL | Status: DC

## 2015-07-13 MED ORDER — BISACODYL 5 MG PO TBEC: 5.0000 mg | DELAYED_RELEASE_TABLET | Freq: Every day | ORAL | Status: DC | PRN

## 2015-07-13 MED ORDER — METHOCARBAMOL 1000 MG/10ML IJ SOLN
500.0000 mg | Freq: Four times a day (QID) | INTRAVENOUS | Status: DC | PRN
  Filled 2015-07-13: qty 5

## 2015-07-13 MED ORDER — 0.9 % SODIUM CHLORIDE (POUR BTL) OPTIME
TOPICAL | Status: DC | PRN
Start: 2015-07-13 — End: 2015-07-13
  Administered 2015-07-13: 1000 mL

## 2015-07-13 MED ORDER — HYDROMORPHONE HCL 2 MG PO TABS
2.0000 mg | ORAL_TABLET | ORAL | Status: DC | PRN
  Administered 2015-07-13 – 2015-07-16 (×19): 4 mg via ORAL
  Filled 2015-07-13 (×19): qty 2

## 2015-07-13 MED ORDER — LOSARTAN POTASSIUM 50 MG PO TABS
50.0000 mg | ORAL_TABLET | Freq: Every day | ORAL | Status: DC
  Administered 2015-07-13 – 2015-07-16 (×4): 50 mg via ORAL
  Filled 2015-07-13 (×4): qty 1

## 2015-07-13 MED ORDER — PROPOFOL 10 MG/ML IV BOLUS
INTRAVENOUS | Status: AC
  Filled 2015-07-13: qty 20

## 2015-07-13 MED ORDER — LACTATED RINGERS IV SOLN
INTRAVENOUS | Status: DC | PRN
  Administered 2015-07-13 (×2): via INTRAVENOUS

## 2015-07-13 MED ORDER — FENTANYL CITRATE (PF) 250 MCG/5ML IJ SOLN
INTRAMUSCULAR | Status: AC
  Filled 2015-07-13: qty 5

## 2015-07-13 MED ORDER — ARTIFICIAL TEARS OP OINT
TOPICAL_OINTMENT | OPHTHALMIC | Status: AC
  Filled 2015-07-13: qty 3.5

## 2015-07-13 MED ORDER — SODIUM CHLORIDE 0.9 % IJ SOLN
INTRAMUSCULAR | Status: DC | PRN
  Administered 2015-07-13: 20 mL

## 2015-07-13 MED ORDER — LOSARTAN POTASSIUM-HCTZ 50-12.5 MG PO TABS: 1.0000 | ORAL_TABLET | Freq: Every day | ORAL | Status: DC

## 2015-07-13 MED ORDER — LIDOCAINE HCL (CARDIAC) 20 MG/ML IV SOLN
INTRAVENOUS | Status: DC | PRN
  Administered 2015-07-13: 50 mg via INTRATRACHEAL

## 2015-07-13 MED ORDER — METOCLOPRAMIDE HCL 5 MG PO TABS: 5.0000 mg | ORAL_TABLET | Freq: Three times a day (TID) | ORAL | Status: DC | PRN

## 2015-07-13 MED ORDER — HYDROMORPHONE HCL 1 MG/ML IJ SOLN
0.5000 mg | INTRAMUSCULAR | Status: DC | PRN
  Administered 2015-07-13: 1 mg via INTRAVENOUS
  Filled 2015-07-13: qty 1

## 2015-07-13 MED ORDER — ONDANSETRON HCL 4 MG/2ML IJ SOLN
INTRAMUSCULAR | Status: AC
  Filled 2015-07-13: qty 2

## 2015-07-13 MED ORDER — ALPRAZOLAM 0.5 MG PO TABS: 1.0000 mg | ORAL_TABLET | Freq: Every evening | ORAL | Status: DC | PRN

## 2015-07-13 MED ORDER — FENTANYL CITRATE (PF) 100 MCG/2ML IJ SOLN
25.0000 ug | INTRAMUSCULAR | Status: DC | PRN
  Administered 2015-07-13 (×2): 50 ug via INTRAVENOUS

## 2015-07-13 MED ORDER — TRANEXAMIC ACID 1000 MG/10ML IV SOLN
1000.0000 mg | INTRAVENOUS | Status: AC
  Administered 2015-07-13: 1000 mg via INTRAVENOUS
  Filled 2015-07-13: qty 10

## 2015-07-13 MED ORDER — FLEET ENEMA 7-19 GM/118ML RE ENEM
1.0000 | ENEMA | Freq: Once | RECTAL | Status: DC | PRN
Start: 2015-07-13 — End: 2015-07-16

## 2015-07-13 MED ORDER — MENTHOL 3 MG MT LOZG: 1.0000 | LOZENGE | OROMUCOSAL | Status: DC | PRN

## 2015-07-13 SURGICAL SUPPLY — 53 items
BANDAGE ESMARK 6X9 LF (GAUZE/BANDAGES/DRESSINGS) ×1 IMPLANT
BLADE SAG 18X100X1.27 (BLADE) ×2 IMPLANT
BLADE SAW SGTL 13X75X1.27 (BLADE) ×2 IMPLANT
BLADE SURG ROTATE 9660 (MISCELLANEOUS) IMPLANT
BNDG ELASTIC 6X10 VLCR STRL LF (GAUZE/BANDAGES/DRESSINGS) ×2 IMPLANT
BNDG ESMARK 6X9 LF (GAUZE/BANDAGES/DRESSINGS) ×2
BOWL SMART MIX CTS (DISPOSABLE) ×2 IMPLANT
CAPT KNEE TOTAL 3 ATTUNE ×2 IMPLANT
CEMENT HV SMART SET (Cement) ×4 IMPLANT
COVER SURGICAL LIGHT HANDLE (MISCELLANEOUS) ×2 IMPLANT
CUFF TOURNIQUET SINGLE 34IN LL (TOURNIQUET CUFF) ×2 IMPLANT
CUFF TOURNIQUET SINGLE 44IN (TOURNIQUET CUFF) IMPLANT
DRAPE EXTREMITY T 121X128X90 (DRAPE) ×2 IMPLANT
DRAPE U-SHAPE 47X51 STRL (DRAPES) ×2 IMPLANT
DRSG AQUACEL AG ADV 3.5X 4 (GAUZE/BANDAGES/DRESSINGS) ×2 IMPLANT
DRSG AQUACEL AG ADV 3.5X10 (GAUZE/BANDAGES/DRESSINGS) ×2 IMPLANT
DURAPREP 26ML APPLICATOR (WOUND CARE) ×4 IMPLANT
ELECT REM PT RETURN 9FT ADLT (ELECTROSURGICAL) ×2
ELECTRODE REM PT RTRN 9FT ADLT (ELECTROSURGICAL) ×1 IMPLANT
EVACUATOR 1/8 PVC DRAIN (DRAIN) IMPLANT
GLOVE BIO SURGEON STRL SZ7.5 (GLOVE) ×2 IMPLANT
GLOVE BIO SURGEON STRL SZ8.5 (GLOVE) ×2 IMPLANT
GLOVE BIOGEL PI IND STRL 8 (GLOVE) ×1 IMPLANT
GLOVE BIOGEL PI IND STRL 9 (GLOVE) ×1 IMPLANT
GLOVE BIOGEL PI INDICATOR 8 (GLOVE) ×1
GLOVE BIOGEL PI INDICATOR 9 (GLOVE) ×1
GOWN STRL REUS W/ TWL LRG LVL3 (GOWN DISPOSABLE) ×1 IMPLANT
GOWN STRL REUS W/ TWL XL LVL3 (GOWN DISPOSABLE) ×2 IMPLANT
GOWN STRL REUS W/TWL LRG LVL3 (GOWN DISPOSABLE) ×1
GOWN STRL REUS W/TWL XL LVL3 (GOWN DISPOSABLE) ×2
HANDPIECE INTERPULSE COAX TIP (DISPOSABLE) ×1
HOOD PEEL AWAY FACE SHEILD DIS (HOOD) ×4 IMPLANT
KIT BASIN OR (CUSTOM PROCEDURE TRAY) ×2 IMPLANT
KIT ROOM TURNOVER OR (KITS) ×2 IMPLANT
MANIFOLD NEPTUNE II (INSTRUMENTS) ×2 IMPLANT
NEEDLE SPNL 18GX3.5 QUINCKE PK (NEEDLE) IMPLANT
NS IRRIG 1000ML POUR BTL (IV SOLUTION) ×2 IMPLANT
PACK TOTAL JOINT (CUSTOM PROCEDURE TRAY) ×2 IMPLANT
PAD ARMBOARD 7.5X6 YLW CONV (MISCELLANEOUS) ×4 IMPLANT
SET HNDPC FAN SPRY TIP SCT (DISPOSABLE) ×1 IMPLANT
SUT VIC AB 0 CT1 27 (SUTURE) ×1
SUT VIC AB 0 CT1 27XBRD ANBCTR (SUTURE) ×1 IMPLANT
SUT VIC AB 1 CTX 36 (SUTURE) ×1
SUT VIC AB 1 CTX36XBRD ANBCTR (SUTURE) ×1 IMPLANT
SUT VIC AB 2-0 CT1 27 (SUTURE)
SUT VIC AB 2-0 CT1 TAPERPNT 27 (SUTURE) IMPLANT
SUT VIC AB 3-0 CT1 27 (SUTURE) ×1
SUT VIC AB 3-0 CT1 TAPERPNT 27 (SUTURE) ×1 IMPLANT
SYR 50ML LL SCALE MARK (SYRINGE) ×2 IMPLANT
TOWEL OR 17X24 6PK STRL BLUE (TOWEL DISPOSABLE) ×2 IMPLANT
TOWEL OR 17X26 10 PK STRL BLUE (TOWEL DISPOSABLE) ×2 IMPLANT
TRAY CATH 16FR W/PLASTIC CATH (SET/KITS/TRAYS/PACK) IMPLANT
WATER STERILE IRR 1000ML POUR (IV SOLUTION) ×6 IMPLANT

## 2015-07-13 NOTE — Care Management Important Message (Signed)
Important Message  Patient Details  Name: Leslie Martinez MRN: [id number] Date of Birth: Feb 18, 1966   Medicare Important Message Given:  Yes    Nathen May 07/13/2015, 12:48 PM

## 2015-07-13 NOTE — Interval H&P Note (Signed)
History and Physical Interval Note:  07/13/2015 7:11 AM  Leslie Martinez  has presented today for surgery, with the diagnosis of LEFT KNEE OSTEOARTHRITIS/AVASCULSR NECROSIS   The various methods of treatment have been discussed with the patient and family. After consideration of risks, benefits and other options for treatment, the patient has consented to  Procedure(s): TOTAL KNEE ARTHROPLASTY (Left) as a surgical intervention .  The patient's history has been reviewed, patient examined, no change in status, stable for surgery.  I have reviewed the patient's chart and labs.  Questions were answered to the patient's satisfaction.     Kerin Salen

## 2015-07-13 NOTE — Transfer of Care (Signed)
Immediate Anesthesia Transfer of Care Note  Patient: Leslie Martinez  Procedure(s) Performed: Procedure(s): TOTAL KNEE ARTHROPLASTY (Left)  Patient Location: PACU  Anesthesia Type:Spinal  Level of Consciousness: awake, oriented, sedated, patient cooperative and responds to stimulation  Airway & Oxygen Therapy: Patient Spontanous Breathing and Patient connected to nasal cannula oxygen  Post-op Assessment: Report given to RN, Post -op Vital signs reviewed and stable, Patient moving all extremities and Patient moving all extremities X 4  Post vital signs: Reviewed and stable  Last Vitals:  Filed Vitals:   07/13/15 0609  BP: 168/90  Pulse: 63  Temp: 36.7 C  Resp: 18    Complications: No apparent anesthesia complications

## 2015-07-13 NOTE — Anesthesia Postprocedure Evaluation (Signed)
Anesthesia Post Note  Patient: Leslie Martinez  Procedure(s) Performed: Procedure(s) (LRB): TOTAL KNEE ARTHROPLASTY (Left)  Patient location during evaluation: PACU Anesthesia Type: MAC and Spinal Level of consciousness: awake, awake and alert and oriented Pain management: pain level controlled Vital Signs Assessment: post-procedure vital signs reviewed and stable Respiratory status: spontaneous breathing Cardiovascular status: blood pressure returned to baseline Postop Assessment: spinal receding Anesthetic complications: no    Last Vitals:  Filed Vitals:   07/13/15 1123 07/13/15 1129  BP: 143/92   Pulse: 52   Temp:  36.4 C  Resp: 11     Last Pain:  Filed Vitals:   07/13/15 1213  PainSc: 5                  Kennard Fildes COKER

## 2015-07-13 NOTE — Op Note (Signed)
PATIENT ID:      Leslie Martinez  MRN:     [id number] DOB/AGE:    [birth year] / 50 y.o.       OPERATIVE REPORT    DATE OF PROCEDURE:  07/13/2015       PREOPERATIVE DIAGNOSIS:   LEFT KNEE OSTEOARTHRITIS/AVASCULSR NECROSIS       Estimated body mass index is 37.49 kg/(m^2) as calculated from the following:   Height as of this encounter: 5\' 9"  (1.753 m).   Weight as of this encounter: 115.214 kg (254 lb).                                                        POSTOPERATIVE DIAGNOSIS:   LEFT KNEE OSTEOARTHRITIS/AVASCULSR NECROSIS                                                                       PROCEDURE:  Procedure(s): TOTAL KNEE ARTHROPLASTY Using DepuyAttune RP implants #6L Femur, #7Tibia, 5 mm Attune RP bearing, 38 Patella     SURGEON: Dahlton Hinde Martinez    ASSISTANT:   [employer]   (Present and scrubbed throughout the case, critical for assistance with exposure, retraction, instrumentation, and closure.)         ANESTHESIA: Spinal, 20cc Exparel, 20cc 0.5% Marcaine  EBL: 300  FLUID REPLACEMENT: 1500 crystalloid  TOURNIQUET TIME: 7min  Drains: None  Tranexamic Acid: 1gm iv 2gm topical   COMPLICATIONS:  None         INDICATIONS FOR PROCEDURE: The patient has  LEFT KNEE OSTEOARTHRITIS/AVASCULSR NECROSIS , no deformities, XR shows bone on bone arthritis, lateral subluxation of tibia. Patient has failed all conservative measures including anti-inflammatory medicines, narcotics, attempts at  exercise and weight loss, cortisone injections and viscosupplementation.  Risks and benefits of surgery have been discussed, questions answered.   DESCRIPTION OF PROCEDURE: The patient identified by armband, received  IV antibiotics, in the holding area at [facility]. Patient taken to the operating room, appropriate anesthetic  monitors were attached, and Spinal anesthesia was  induced. Tourniquet  applied high to the operative thigh. Lateral post and foot positioner  applied to the  table, the lower extremity was then prepped and draped  in usual sterile fashion from the toes to the tourniquet. Time-out procedure was performed. We began the operation, with the knee flexed 120 degrees, by making the anterior midline incision starting at handbreadth above the patella going over the patella 1 cm medial to and 4 cm distal to the tibial tubercle. Small bleeders in the skin and the  subcutaneous tissue identified and cauterized. Transverse retinaculum was incised and reflected medially and a medial parapatellar arthrotomy was accomplished. the patella was everted and theprepatellar fat pad resected. The superficial medial collateral  ligament was then elevated from anterior to posterior along the proximal  flare of the tibia and anterior half of the menisci resected. The knee was hyperflexed exposing bone on bone arthritis. Peripheral and notch osteophytes as well as the cruciate ligaments were then resected. We continued to  work our  way around posteriorly along the proximal tibia, and externally  rotated the tibia subluxing it out from underneath the femur. A McHale  retractor was placed through the notch and a lateral Hohmann retractor  placed, and we then drilled through the proximal tibia in line with the  axis of the tibia followed by an intramedullary guide rod and 2-degree  posterior slope cutting guide. The tibial cutting guide, 3 degree posterior sloped, was pinned into place allowing resection of 7 mm of bone medially and 9 mm of bone laterally. Satisfied with the tibial resection, we then  entered the distal femur 2 mm anterior to the PCL origin with the  intramedullary guide rod and applied the distal femoral cutting guide  set at 9 mm, with 5 degrees of valgus. This was pinned along the  epicondylar axis. At this point, the distal femoral cut was accomplished without difficulty. We then sized for a #6L femoral component and pinned the guide in 3 degrees of external  rotation. The chamfer cutting guide was pinned into place. The anterior, posterior, and chamfer cuts were accomplished without difficulty followed by  the Attune RP box cutting guide and the box cut. We also removed posterior osteophytes from the posterior femoral condyles. At this  time, the knee was brought into full extension. We checked our  extension and flexion gaps and found them symmetric for a 5 mm bearing. Distracting in extension with a lamina spreader, the posterior horns of the menisci were removed, and Exparel, diluted to 60 cc, with 20cc NS, and 20cc 0.5% Marcaine,was injected into the capsule and synovium of the knee. The posterior patella cut was accomplished with the 9.5 mm Attune cutting guide, sized for a 41mm dome, and the fixation pegs drilled.The knee  was then once again hyperflexed exposing the proximal tibia. We sized for a # 7 tibial base plate, applied the smokestack and the conical reamer followed by the the Delta fin keel punch. We then hammered into place the Attune RP trial femoral component, drilled the lugs, inserted a  5 mm trial bearing, trial patellar button, and took the knee through range of motion from 0-130 degrees. No thumb pressure was required for patellar Tracking. At this point, the limb was wrapped with an Esmarch bandage and the tourniquet inflated to 350 mmHg. All trial components were removed, mating surfaces irrigated with pulse lavage, and dried with suction and sponges. A double batch of DePuy HV cement with 1500 mg of Zinacef was mixed and applied to all bony metallic mating surfaces except for the posterior condyles of the femur itself. In order, we  hammered into place the tibial tray and removed excess cement, the femoral component and removed excess cement. The final Attune RP bearing  was inserted, and the knee brought to full extension with compression.  The patellar button was clamped into place, and excess cement  removed. While the cement cured  the wound was irrigated out with normal saline solution pulse lavage. Ligament stability and patellar tracking were checked and found to be excellent. The parapatellar arthrotomy was closed with  running #1 Vicryl suture. The subcutaneous tissue with 0 and 2-0 undyed  Vicryl suture, and the skin with running 3-0 SQ vicryl. A dressing of Xeroform,  4 x 4, dressing sponges, Webril, and Ace wrap applied. The patient  awakened, and taken to recovery room without difficulty.   Leslie Martinez 07/13/2015, 9:00 AM

## 2015-07-13 NOTE — Anesthesia Procedure Notes (Addendum)
Spinal  Start time: 07/13/2015 7:45 AM End time: 07/13/2015 7:50 AM Spinal Block Patient position: sitting Prep: Betasept Patient monitoring: heart rate, cardiac monitor, continuous pulse ox and blood pressure Approach: right paramedian Location: L4-5 Injection technique: single-shot Assessment Sensory level: T6 Additional Notes 10 mg 0.75% Bupivacaine with 1:200 Epi injected easily  Procedure Name: MAC Date/Time: 07/13/2015 7:40 AM Performed by: Jacquiline Doe A Pre-anesthesia Checklist: Patient identified, Timeout performed, Emergency Drugs available, Suction available and Patient being monitored Patient Re-evaluated:Patient Re-evaluated prior to inductionOxygen Delivery Method: Nasal cannula Intubation Type: IV induction Placement Confirmation: positive ETCO2 Dental Injury: Teeth and Oropharynx as per pre-operative assessment

## 2015-07-13 NOTE — Evaluation (Signed)
Physical Therapy Evaluation Patient Details Name: CAMRIN ALAMIN MRN: [id number] DOB: [birth year] Today's Date: 07/13/2015   History of Present Illness  Pt presents for left TKA. PMH: sleep apnea, HTN, GERD, OA, asthma  Clinical Impression  Pt is s/p TKA resulting in the deficits listed below (see PT Problem List). Pt transferred 2x, bed to [facility] and BSC to recliner, very lethargic as well as nauseous but able to use RW effectively for small steps.  Pt will benefit from skilled PT to increase their independence and safety with mobility to allow discharge to the venue listed below.      Follow Up Recommendations SNF    Equipment Recommendations  Rolling walker with 5" wheels    Recommendations for Other Services       Precautions / Restrictions Precautions Precautions: Knee Precaution Comments: reviewed proper positioning and use of CPM/ zero knee foam. Predominantly educated dtr because pt very sleepy Restrictions Weight Bearing Restrictions: Yes LLE Weight Bearing: Weight bearing as tolerated      Mobility  Bed Mobility Overal bed mobility: Needs Assistance Bed Mobility: Supine to Sit     Supine to sit: Min assist     General bed mobility comments: use of rail, support given to LLE  Transfers Overall transfer level: Needs assistance Equipment used: Rolling walker (2 wheeled) Transfers: Sit to/from [facility] Sit to Stand: Min assist Stand pivot transfers: Min assist       General transfer comment: vc's for hand placement and sequencing steps for bed to [facility] and BSC to recliner (to right each time), min A to steady, pt nauseous with activity, RN notified  Ambulation/Gait             General Gait Details: pt nauseous and very lethargic, ambulation deferred  Stairs            Wheelchair Mobility    Modified Rankin (Stroke Patients Only)       Balance Overall balance assessment: Needs assistance Sitting-balance support: No upper  extremity supported Sitting balance-[name]: Good     Standing balance support: Bilateral upper extremity supported Standing balance-[name]: Poor Standing balance comment: reliance on RW                             Pertinent Vitals/Pain Pain Assessment: Faces Faces Pain Scale: Hurts even more Pain Location: left knee Pain Descriptors / Indicators: Aching Pain Intervention(s): Limited activity within patient's tolerance;Monitored during session;Premedicated before session;Repositioned    Home Living Family/patient expects to be discharged to:: Skilled nursing facility Living Arrangements: Alone                    Prior Function Level of Independence: Independent               Hand Dominance        Extremity/Trunk Assessment   Upper Extremity Assessment: Overall WFL for tasks assessed           Lower Extremity Assessment: LLE deficits/detail   LLE Deficits / Details: hip flex 2/5, knee flex / ext 2/5  Cervical / Trunk Assessment: Normal  Communication   Communication: No difficulties  Cognition Arousal/Alertness: Lethargic;Suspect due to medications Behavior During Therapy: [facility] for tasks assessed/performed Overall Cognitive Status: Within Functional Limits for tasks assessed                      General Comments General comments (skin integrity,  edema, etc.): pt left in zero knee foam    Exercises Total Joint Exercises Ankle Circles/Pumps: AROM;Both;10 reps;Seated Quad Sets: AROM;Both;10 reps;Seated Gluteal Sets: AROM;Both;10 reps;Seated Goniometric ROM: 10-85      Assessment/Plan    PT Assessment Patient needs continued PT services  PT Diagnosis Difficulty walking;Acute pain;Abnormality of gait   PT Problem List Decreased strength;Decreased range of motion;Decreased activity tolerance;Decreased balance;Decreased mobility;Decreased knowledge of use of DME;Decreased knowledge of precautions;Pain  PT Treatment  Interventions DME instruction;Gait training;Functional mobility training;Therapeutic activities;Therapeutic exercise;Balance training;Patient/family education;Neuromuscular re-education   PT Goals (Current goals can be found in the Care Plan section) Acute Rehab PT Goals Patient Stated Goal: none stated PT Goal Formulation: With patient Time For Goal Achievement: 07/20/15 Potential to Achieve Goals: Good    Frequency 7X/week   Barriers to discharge Decreased caregiver support lives alone, planning to go to rehab before home    Co-evaluation               End of Session Equipment Utilized During Treatment: Gait belt Activity Tolerance: Patient limited by lethargy Patient left: in chair;with call bell/phone within reach;with family/visitor present Nurse Communication: Mobility status;Other (comment) (nausea)         Time: [id number] PT Time Calculation (min) (ACUTE ONLY): 25 min   Charges:   PT Evaluation $PT Eval Moderate Complexity: 1 Procedure PT Treatments $Therapeutic Activity: 8-22 mins   PT G Codes:      Leighton Roach, PT  Acute Rehab Services  [place], [place] 07/13/2015, 3:40 PM

## 2015-07-13 NOTE — Discharge Instructions (Signed)

## 2015-07-13 NOTE — Progress Notes (Signed)
Orthopedic Tech Progress Note Patient Details:  Leslie Martinez 1965-12-24 [id number]  CPM Left Knee CPM Left Knee: On Left Knee Flexion (Degrees): 40 Left Knee Extension (Degrees): 0 Additional Comments: trapeze bar patient helper Viewed order from doctor's order list  [name] 07/13/2015, 10:13 AM

## 2015-07-13 NOTE — Anesthesia Preprocedure Evaluation (Addendum)
Anesthesia Evaluation  Patient identified by MRN, date of birth, ID band Patient awake    Reviewed: Allergy & Precautions, NPO status , Patient's Chart, lab work & pertinent test results  Airway Mallampati: II  TM Distance: >3 FB     Dental  (+) Teeth Intact   Pulmonary    breath sounds clear to auscultation       Cardiovascular hypertension,  Rhythm:Regular Rate:Normal     Neuro/Psych    GI/Hepatic   Endo/Other    Renal/GU      Musculoskeletal   Abdominal   Peds  Hematology   Anesthesia Other Findings   Reproductive/Obstetrics                            Anesthesia Physical Anesthesia Plan  ASA: II  Anesthesia Plan: MAC and Spinal   Post-op Pain Management:    Induction: Intravenous  Airway Management Planned: Simple Face Mask and Natural Airway  Additional Equipment:   Intra-op Plan:   Post-operative Plan:   Informed Consent: I have reviewed the patients History and Physical, chart, labs and discussed the procedure including the risks, benefits and alternatives for the proposed anesthesia with the patient or authorized representative who has indicated his/her understanding and acceptance.     Plan Discussed with: CRNA and Anesthesiologist  Anesthesia Plan Comments: (Plan SAB)        Anesthesia Quick Evaluation

## 2015-07-14 ENCOUNTER — Encounter (HOSPITAL_COMMUNITY): Payer: Self-pay | Admitting: Orthopedic Surgery

## 2015-07-14 LAB — BASIC METABOLIC PANEL
ANION GAP: 10 (ref 5–15)
BUN: 7 mg/dL (ref 6–20)
BUN: 7 mg/dL (ref 4–21)
CALCIUM: 8.8 mg/dL — AB (ref 8.9–10.3)
CO2: 24 mmol/L (ref 22–32)
Chloride: 102 mmol/L (ref 101–111)
Creatinine, Ser: 0.61 mg/dL (ref 0.44–1.00)
Creatinine: 0.6 mg/dL (ref 0.5–1.1)
GFR calc Af Amer: >60 mL/min (ref >60)
GLUCOSE: 162 mg/dL — AB (ref 65–99)
Glucose: 162 mg/dL
Potassium: 3.7 mmol/L (ref 3.5–5.1)
SODIUM: 136 mmol/L (ref 135–145)
Sodium: 136 mmol/L — AB (ref 137–147)

## 2015-07-14 LAB — CBC
HCT: 31.6 % — ABNORMAL LOW (ref 36.0–46.0)
Hemoglobin: 10.3 g/dL — ABNORMAL LOW (ref 12.0–15.0)
MCH: 26.9 pg (ref 26.0–34.0)
MCHC: 32.6 g/dL (ref 30.0–36.0)
MCV: 82.5 fL (ref 78.0–100.0)
PLATELETS: 204 10*3/uL (ref 150–400)
RBC: 3.83 MIL/uL — AB (ref 3.87–5.11)
RDW: 13.4 % (ref 11.5–15.5)
WBC: 5.6 10*3/uL (ref 4.0–10.5)

## 2015-07-14 NOTE — Progress Notes (Signed)
Orthopedic Tech Progress Note Patient Details:  Leslie Martinez 05/16/1965 [id number] Ortho visit adjust cpm  Patient ID: [name], female   DOB: 1965/10/24, 50 y.o.   MRN: [id number]   [name] 07/14/2015, 3:23 PM

## 2015-07-14 NOTE — Progress Notes (Signed)
Physical Therapy Treatment Patient Details Name: Leslie Martinez MRN: [id number] DOB: Oct 10, 1965 Today's Date: 07/14/2015    History of Present Illness Pt presents for left TKA. PMH: sleep apnea, HTN, GERD, OA, asthma    PT Comments    Patient is progressing toward mobility goals. Current plan remains appropriate.   Follow Up Recommendations  SNF     Equipment Recommendations  Rolling walker with 5" wheels    Recommendations for Other Services       Precautions / Restrictions Precautions Precautions: Knee Precaution Comments: reviewed proper positioning and use of CPM/ zero knee foam. Predominantly educated dtr because pt very sleepy Restrictions Weight Bearing Restrictions: Yes LLE Weight Bearing: Weight bearing as tolerated    Mobility  Bed Mobility Overal bed mobility: Needs Assistance Bed Mobility: Supine to Sit     Supine to sit: Min guard     General bed mobility comments: min guard for safety; increased time and cues for technique needed; use of bedrail; HOB slightly elevated  Transfers Overall transfer level: Needs assistance Equipment used: Rolling walker (2 wheeled) Transfers: Sit to/from Stand Sit to Stand: Mod assist;Min guard         General transfer comment: mod A to power up into standing first trial from EOB and min guard from 3 in 1 with cues for hand placement and technique  Ambulation/Gait Ambulation/Gait assistance: Min guard Ambulation Distance (Feet): 70 Feet Assistive device: Rolling walker (2 wheeled) Gait Pattern/deviations: Step-to pattern;Step-through pattern;Decreased stance time - left;Decreased step length - right;Decreased weight shift to left;Antalgic;Trunk flexed Gait velocity: decreased   General Gait Details: cues for sequencing, posture, engaging L quad before WS to L LE, and position of RW; heavy reliance on RW for supportpt with improved bilat step length with increased distance   Stairs            Wheelchair  Mobility    Modified Rankin (Stroke Patients Only)       Balance Overall balance assessment: Needs assistance Sitting-balance support: No upper extremity supported;Feet supported Sitting balance-[name]: Good     Standing balance support: Bilateral upper extremity supported Standing balance-[name]: Fair                      Cognition Arousal/Alertness: Lethargic;Suspect due to medications Behavior During Therapy: Flat affect Overall Cognitive Status: Within Functional Limits for tasks assessed                      Exercises Total Joint Exercises Ankle Circles/Pumps: AROM;Both;10 reps;Supine Quad Sets: AROM;Both;10 reps;Supine Heel Slides: AROM;AAROM;Left;10 reps;Supine Hip ABduction/ADduction: AROM;Left;10 reps;Supine Straight Leg Raises: AAROM;Left;5 reps;Supine Long Arc Quad: AAROM;Left;5 reps;Seated Knee Flexion: AROM;Left;10 reps;Seated Goniometric ROM: 3-80    General Comments General comments (skin integrity, edema, etc.): family present      Pertinent Vitals/Pain Pain Assessment: 0-10 Pain Score: 7  Pain Location: L knee with mobility Pain Descriptors / Indicators: Sore Pain Intervention(s): Limited activity within patient's tolerance;Monitored during session;Premedicated before session;Repositioned;RN gave pain meds during session    Home Living                      Prior Function            PT Goals (current goals can now be found in the care plan section) Acute Rehab PT Goals Patient Stated Goal: none stated PT Goal Formulation: With patient Time For Goal Achievement: 07/20/15 Potential to Achieve Goals: Good Progress towards PT  goals: Progressing toward goals    Frequency  7X/week    PT Plan Current plan remains appropriate    Co-evaluation             End of Session Equipment Utilized During Treatment: Gait belt Activity Tolerance: Patient tolerated treatment well Patient left: in chair;with call  bell/phone within reach;with family/visitor present;with nursing/sitter in room     Time: 1015-1047 PT Time Calculation (min) (ACUTE ONLY): 32 min  Charges:  $Gait Training: 8-22 mins $Therapeutic Exercise: 8-22 mins                    G Codes:      Salina April, PTA Pager: [phone]   07/14/2015, 10:56 AM

## 2015-07-14 NOTE — Progress Notes (Signed)
Patient ID: DENEKA REEH, female   DOB: 1965-11-21, 50 y.o.   MRN: [id number] PATIENT ID: MISAKI BURROWES  MRN: [id number]  DOB/AGE:  1966/01/15 / 50 y.o.  1 Day Post-Op Procedure(s) (LRB): TOTAL KNEE ARTHROPLASTY (Left)    PROGRESS NOTE Subjective: Patient is alert, oriented, x1 Nausea, no Vomiting, yes passing gas. Taking PO well. Denies SOB, Chest or Calf Pain. Using Incentive Spirometer, PAS in place. Ambulate WBAT, CPM 0-40 Patient reports pain as 5/10 .    Objective: Vital signs in last 24 hours: Filed Vitals:   07/13/15 1129 07/13/15 1212 07/13/15 2130 07/14/15 0139  BP:   130/70 131/73  Pulse:   68 58  Temp: 97.5 F (36.4 C)  98.5 F (36.9 C) 97.6 F (36.4 C)  Resp:   12   Height:      Weight:      SpO2:  100% 100% 97%      Intake/Output from previous day: I/O last 3 completed shifts: In: 2080 [P.O.:480; I.V.:1600] Out: 57 [Blood:50]   Intake/Output this shift: Total I/O In: 1893.8 [P.O.:200; I.V.:1693.8] Out: -    LABORATORY DATA: No results for input(s): WBC, HGB, HCT, PLT, NA, K, CL, CO2, BUN, CREATININE, GLUCOSE, GLUCAP, INR, CALCIUM in the last 72 hours.  Invalid input(s): PT, 2  Examination: Neurologically intact ABD soft Neurovascular intact Sensation intact distally Intact pulses distally Dorsiflexion/Plantar flexion intact Incision: scant drainage No cellulitis present Compartment soft}  Assessment:   1 Day Post-Op Procedure(s) (LRB): TOTAL KNEE ARTHROPLASTY (Left) ADDITIONAL DIAGNOSIS: Expected Acute Blood Loss Anemia, Hypertension, hx asthma  Plan: PT/OT WBAT, CPM 5/hrs day until ROM 0-90 degrees, then D/C CPM DVT Prophylaxis:  SCDx72hrs, ASA 325 mg BID x 2 weeks DISCHARGE PLAN: Skilled Nursing Facility/Rehab, [facility] DISCHARGE NEEDS: HHPT, [place], Walker and 3-in-1 comode seat     Laraya Pestka J 07/14/2015, 6:53 AM

## 2015-07-14 NOTE — Clinical Social Work Placement (Signed)
   CLINICAL SOCIAL WORK PLACEMENT  NOTE  Date:  07/14/2015  Patient Details  Name: Leslie Martinez MRN: [id number] Date of Birth: 13-May-1965  Clinical Social Work is seeking post-discharge placement for this patient at the [place] level of care (*CSW will initial, date and re-position this form in  chart as items are completed):  Yes   Patient/family provided with [facility]'s list of facilities offering this level of care within the geographic area requested by the patient (or if unable, by the patient's family).  Yes   Patient/family informed of their freedom to choose among providers that offer the needed level of care, that participate in Medicare, Medicaid or managed care program needed by the patient, have an available bed and are willing to accept the patient.  Yes   Patient/family informed of [facility]'s ownership interest in [facility] and [facility], as well as of the fact that they are under no obligation to receive care at these facilities.  PASRR submitted to EDS on 07/14/15     PASRR number received on 07/14/15     Existing PASRR number confirmed on       FL2 transmitted to all facilities in geographic area requested by pt/family on 07/14/15     FL2 transmitted to all facilities within larger geographic area on       Patient informed that his/her managed care company has contracts with or will negotiate with certain facilities, including the following:        Yes   Patient/family informed of bed offers received.  Patient [facility] bed at [facility]     Physician recommends and patient chooses bed at      Patient to be transferred to [facility] on  .  Patient to be transferred to facility by [facility]     Patient family notified on   of transfer.  Name of family member notified:        PHYSICIAN Please sign FL2, Please prepare prescriptions     Additional Comment:     _______________________________________________ Caroline Sauger, LCSW 07/14/2015, 3:20 PM

## 2015-07-14 NOTE — Clinical Social Work Note (Signed)
Clinical Social Work Assessment  Patient Details  Name: Leslie Martinez MRN: [id number] Date of Birth: October 27, 1965  Date of referral:  07/14/15               Reason for consult:  Discharge Planning                Permission sought to share information with:  Chartered certified accountant granted to share information::  Yes, Verbal Permission Granted  Name::        Agency::  [employer]  Relationship::     Contact Information:     Housing/Transportation Living arrangements for the past 2 months:  Single Family Home Source of Information:  Patient Patient Interpreter Needed:  None Criminal Activity/Legal Involvement Pertinent to Current Situation/Hospitalization:  No - Comment as needed Significant Relationships:  Other(Comment) (Patient states she lives alone and did not disclose personal relationships.) Lives with:  Self Do you feel safe going back to the place where you live?  No Need for family participation in patient care:  No (Coment) (Patient able to make own decisions.)  Care giving concerns:  Patient expressed no concerns at this time.   Social Worker assessment / plan:  CSW received referral stating patient to be discharged to [facility] once medically stable for discharge. CSW spoke with patient to confirm discharge plan. Patient confirmed patient to discharge to [facility]. CSW to continue to follow and assist with discharge planning needs.  Employment status:  Other (Comment) (Did not disclose.) Insurance information:  Managed Care Nurse, mental health) PT Recommendations:  [place] / Referral to community resources:  [place]  Patient/Family's Response to care:  Patient understanding and agreeable to CSW plan of care.  Patient/Family's Understanding of and Emotional Response to Diagnosis, Current Treatment, and Prognosis:  Patient understanding and agreeable to CSW plan of care.  Emotional Assessment Appearance:   Appears stated age Attitude/Demeanor/Rapport:  Other (Appropriate) Affect (typically observed):  Quiet, Pleasant, Accepting, Appropriate Orientation:  Oriented to Self, Oriented to Place, Oriented to  Time, Oriented to Situation Alcohol / Substance use:  Not Applicable Psych involvement (Current and /or in the community):  No (Comment) (Not appropriate on this admission.)  Discharge Needs  Concerns to be addressed:  No discharge needs identified Readmission within the last 30 days:  No Current discharge risk:  None Barriers to Discharge:  No Barriers Identified   Caroline Sauger, LCSW 07/14/2015, 3:18 PM [phone]

## 2015-07-14 NOTE — NC FL2 (Signed)
[place] LEVEL OF CARE SCREENING TOOL     IDENTIFICATION  Patient Name: MARDA MIR Birthdate: 1965/11/24 Sex: female Admission Date (Current Location): 07/13/2015  [facility] and [place] Number:  Herbalist and Address:  The [facility] [place] [address] [place], [place] [address]      Provider Number: [id number]  Attending Physician Name and Address:  Frederik Pear, MD  Relative Name and Phone Number:       Current Level of Care: Hospital Recommended Level of Care: [place] Prior Approval Number:    Date Approved/Denied:   PASRR Number: [id number]  Discharge Plan: SNF    Current Diagnoses: Patient Active Problem List   Diagnosis Date Noted  . Primary osteoarthritis of knee 07/13/2015  . Primary osteoarthritis of left knee 07/11/2015  . 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 Lap Nissen Nov 2013 02/06/2012  . HYPERTENSION 01/09/2008  . 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 ASTHMA 01/09/2008  . BARRETT'S ESOPHAGUS, HX OF 01/09/2008  . NEPHROLITHIASIS, HX OF 01/09/2008  . DYSPHAGIA UNSPECIFIED 11/26/2007    Orientation RESPIRATION BLADDER Height & Weight     Self, Time, Situation, Place  Normal Continent Weight: 254 lb (115.214 kg) Height:  5\' 9"  (175.3 cm)  BEHAVIORAL SYMPTOMS/MOOD NEUROLOGICAL BOWEL NUTRITION STATUS      Continent Diet (Please see discharge summary.)  AMBULATORY STATUS COMMUNICATION OF NEEDS Skin   Limited Assist Verbally Surgical wounds                       Personal Care Assistance Level of Assistance  Bathing, Feeding, Dressing Bathing Assistance: Limited assistance Feeding assistance: Independent Dressing Assistance: Limited assistance     Functional Limitations Info             SPECIAL CARE FACTORS FREQUENCY  PT (By licensed PT), OT (By licensed OT)                    Contractures      Additional Factors Info  Code Status,  Allergies Code Status Info: Not on file. Allergies Info: Adhesive, Iodine, Lisinopril, Shellfish Allergy, Cephalexin, Latex, Oxycodone-acetaminophen, Prochlorperazine Edisylate           Current Medications (07/14/2015):  This is the current hospital active medication list Current Facility-Administered Medications  Medication Dose Route Frequency Provider Last Rate Last Dose  . acetaminophen (TYLENOL) tablet 650 mg  650 mg Oral Q6H PRN Frederik Pear, MD       Or  . acetaminophen (TYLENOL) suppository 650 mg  650 mg Rectal Q6H PRN Frederik Pear, MD      . albuterol (PROVENTIL) (2.5 MG/3ML) 0.083% nebulizer solution 3 mL  3 mL Inhalation Q6H PRN Frederik Pear, MD      . ALPRAZolam Duanne Moron) tablet 1 mg  1 mg Oral QHS PRN Frederik Pear, MD      . alum & mag hydroxide-simeth (MAALOX/MYLANTA) 200-200-20 MG/5ML suspension 30 mL  30 mL Oral Q4H PRN Frederik Pear, MD      . aspirin EC tablet 325 mg  325 mg Oral Q breakfast Frederik Pear, MD   325 mg at 07/14/15 0748  . bisacodyl (DULCOLAX) EC tablet 5 mg  5 mg Oral Daily PRN Frederik Pear, MD      . dextrose 5 % and 0.45 % NaCl with KCl 20 mEq/L infusion   Intravenous Continuous Frederik Pear, MD 125 mL/hr at 07/14/15 1500    .  diphenhydrAMINE (BENADRYL) 12.5 MG/5ML elixir 12.5-25 mg  12.5-25 mg Oral Q4H PRN Frederik Pear, MD   25 mg at 07/13/15 1249  . docusate sodium (COLACE) capsule 100 mg  100 mg Oral BID Frederik Pear, MD   100 mg at 07/14/15 1045  . DULoxetine (CYMBALTA) DR capsule 60 mg  60 mg Oral Daily Frederik Pear, MD   60 mg at 07/14/15 1045  . losartan (COZAAR) tablet 50 mg  50 mg Oral Daily Frederik Pear, MD   50 mg at 07/14/15 1045   And  . hydrochlorothiazide (MICROZIDE) capsule 12.5 mg  12.5 mg Oral Daily Frederik Pear, MD   12.5 mg at 07/14/15 1045  . HYDROmorphone (DILAUDID) injection 0.5-1 mg  0.5-1 mg Intravenous Q2H PRN Frederik Pear, MD   1 mg at 07/13/15 1249  . HYDROmorphone (DILAUDID) tablet 2-4 mg  2-4 mg Oral Q3H PRN Frederik Pear, MD   4 mg at 07/14/15  1505  . menthol-cetylpyridinium (CEPACOL) lozenge 3 mg  1 lozenge Oral PRN Frederik Pear, MD       Or  . phenol (CHLORASEPTIC) mouth spray 1 spray  1 spray Mouth/Throat PRN Frederik Pear, MD      . methocarbamol (ROBAXIN) tablet 500 mg  500 mg Oral Q6H PRN Frederik Pear, MD   500 mg at 07/14/15 1045   Or  . methocarbamol (ROBAXIN) 500 mg in dextrose 5 % 50 mL IVPB  500 mg Intravenous Q6H PRN Frederik Pear, MD      . metoCLOPramide (REGLAN) tablet 5-10 mg  5-10 mg Oral Q8H PRN Frederik Pear, MD       Or  . metoCLOPramide (REGLAN) injection 5-10 mg  5-10 mg Intravenous Q8H PRN Frederik Pear, MD      . metoprolol succinate (TOPROL-XL) 24 hr tablet 100 mg  100 mg Oral QHS Frederik Pear, MD   100 mg at 07/13/15 2132  . ondansetron (ZOFRAN) tablet 4 mg  4 mg Oral Q6H PRN Frederik Pear, MD       Or  . ondansetron [facility]) injection 4 mg  4 mg Intravenous Q6H PRN Frederik Pear, MD      . pantoprazole (PROTONIX) EC tablet 40 mg  40 mg Oral Daily Frederik Pear, MD   40 mg at 07/14/15 1045  . senna-docusate (Senokot-S) tablet 1 tablet  1 tablet Oral QHS PRN Frederik Pear, MD      . sodium phosphate (FLEET) 7-19 GM/118ML enema 1 enema  1 enema Rectal Once PRN Frederik Pear, MD         Discharge Medications: Please see discharge summary for a list of discharge medications.  Relevant Imaging Results:  Relevant Lab Results:   Additional Information SSN: [id number]  Caroline Sauger, LCSW

## 2015-07-14 NOTE — Progress Notes (Signed)
OT Cancellation Note  Patient Details Name: Leslie Martinez MRN: [id number] DOB: 1965/12/09   Cancelled Treatment:    Reason Eval/Treat Not Completed:  (OT screened) Pt's current D/C plan is SNF. No apparent immediate acute care OT needs, therefore will defer OT to SNF. If OT eval is needed please call Acute Rehab Dept. at [phone] or text page OT at [phone].    Benito Mccreedy OTR/L [id number] 07/14/2015, 9:37 AM

## 2015-07-15 LAB — CBC
HEMATOCRIT: 32 % — AB (ref 36.0–46.0)
Hemoglobin: 10.3 g/dL — ABNORMAL LOW (ref 12.0–15.0)
MCH: 27 pg (ref 26.0–34.0)
MCHC: 32.2 g/dL (ref 30.0–36.0)
MCV: 83.8 fL (ref 78.0–100.0)
PLATELETS: 185 10*3/uL (ref 150–400)
RBC: 3.82 MIL/uL — ABNORMAL LOW (ref 3.87–5.11)
RDW: 13.4 % (ref 11.5–15.5)
WBC: 5.7 10*3/uL (ref 4.0–10.5)

## 2015-07-15 NOTE — Progress Notes (Signed)
PATIENT ID: Leslie Martinez  MRN: [id number]  DOB/AGE:  1965/07/05 / 50 y.o.  2 Days Post-Op Procedure(s) (LRB): TOTAL KNEE ARTHROPLASTY (Left)    PROGRESS NOTE Subjective: Patient is alert, oriented, no Nausea, no Vomiting, yes passing gas. Taking PO with small bites. Denies SOB, Chest or Calf Pain. Using Incentive Spirometer, PAS in place. Ambulate WBAT with pt walking 70 ft., CPM 0-40 Patient reports pain as 5/10 .    Objective: Vital signs in last 24 hours: Filed Vitals:   07/14/15 0139 07/14/15 0530 07/14/15 1336 07/14/15 2012  BP: 131/73 114/75 120/65 122/65  Pulse: 58 67 96 65  Temp: 97.6 F (36.4 C) 97.8 F (36.6 C) 97.5 F (36.4 C) 98.5 F (36.9 C)  TempSrc:    Oral  Resp:   16 16  Height:      Weight:      SpO2: 97% 96% 96% 98%      Intake/Output from previous day: I/O last 3 completed shifts: In: 3906.3 [P.O.:800; I.V.:3106.3] Out: -    Intake/Output this shift:     LABORATORY DATA:  Recent Labs  07/14/15 0553  WBC 5.6  HGB 10.3*  HCT 31.6*  PLT 204  NA 136  K 3.7  CL 102  CO2 24  BUN 7  CREATININE 0.61  GLUCOSE 162*  CALCIUM 8.8*    Examination: Neurologically intact Neurovascular intact Sensation intact distally Intact pulses distally Dorsiflexion/Plantar flexion intact Incision: dressing C/D/I No cellulitis present Compartment soft}  Assessment:   2 Days Post-Op Procedure(s) (LRB): TOTAL KNEE ARTHROPLASTY (Left) ADDITIONAL DIAGNOSIS: Expected Acute Blood Loss Anemia, Hypertension and hx of asthma  Plan: PT/OT WBAT, CPM 5/hrs day until ROM 0-90 degrees, then D/C CPM DVT Prophylaxis:  SCDx72hrs, ASA 325 mg BID x 2 weeks DISCHARGE PLAN: Skilled Nursing Facility/Rehab DISCHARGE NEEDS: HHPT, CPM, Walker and 3-in-1 comode seat     Asencion Guisinger R 07/15/2015, 7:56 AM

## 2015-07-15 NOTE — Discharge Summary (Signed)
Patient ID: WELLS ARISPE MRN: [id number] DOB/AGE: 51/27/1967 51 y.o.  Admit date: 07/13/2015 Discharge date: 07/15/2015  Admission Diagnoses:  Principal Problem:   Primary osteoarthritis of left knee Active Problems:   Primary osteoarthritis of knee   Discharge Diagnoses:  Same  Past Medical History  Diagnosis Date  . Depression   . Hypertension   . Angina pectoris ([place])   . Esophagitis   . Esophageal stricture   . Gastritis   . 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 GERD (gastroesophageal reflux disease)   . Asthma     seasonal allergies, dry cough today   . Nephrolithiasis     passed stones spontaneously, also cystoscopy- done   . Arthritis     knees- OA  . Hiatal hernia     history of, treated with surgery    Surgeries: Procedure(s): TOTAL KNEE ARTHROPLASTY on 07/13/2015   Consultants:    Discharged Condition: Improved  Hospital Course: LYDIAN CLARENCE is an 50 y.o. female who was admitted 07/13/2015 for operative treatment ofPrimary osteoarthritis of left knee. Patient has severe unremitting pain that affects sleep, daily activities, and work/hobbies. After pre-op clearance the patient was taken to the operating room on 07/13/2015 and underwent  Procedure(s): TOTAL KNEE ARTHROPLASTY.    Patient was given perioperative antibiotics: Anti-infectives    Start     Dose/Rate Route Frequency Ordered Stop   07/13/15 0818  cefUROXime (ZINACEF) injection  Status:  Discontinued       As needed 07/13/15 0819 07/13/15 0931   07/13/15 0700  vancomycin (VANCOCIN) 1,500 mg in sodium chloride 0.9 % 500 mL IVPB     1,500 mg 250 mL/hr over 120 Minutes Intravenous To ShortStay Surgical 07/10/15 0812 07/13/15 0815       Patient was given sequential compression devices, early ambulation, and chemoprophylaxis to prevent DVT.  Patient benefited maximally from hospital stay and there were no complications.    Recent vital signs: Patient Vitals for the past 24 hrs:  BP Temp Temp src Pulse  Resp SpO2  07/14/15 2012 122/65 mmHg 98.5 F (36.9 C) Oral 65 16 98 %  07/14/15 1336 120/65 mmHg 97.5 F (36.4 C) - 96 16 96 %     Recent laboratory studies:  Recent Labs  07/14/15 0553  WBC 5.6  HGB 10.3*  HCT 31.6*  PLT 204  NA 136  K 3.7  CL 102  CO2 24  BUN 7  CREATININE 0.61  GLUCOSE 162*  CALCIUM 8.8*     Discharge Medications:     Medication List    STOP taking these medications        aspirin 81 MG tablet  Replaced by:  aspirin EC 325 MG tablet     naproxen 500 MG tablet  Commonly known as:  NAPROSYN      TAKE these medications        albuterol 108 (90 Base) MCG/ACT inhaler  Commonly known as:  PROVENTIL HFA;VENTOLIN HFA  Inhale 2 puffs into the lungs every 6 (six) hours as needed for wheezing or shortness of breath.     ALPRAZolam 1 MG tablet  Commonly known as:  XANAX  Take 1 mg by mouth at bedtime as needed for anxiety.     aspirin EC 325 MG tablet  Take 1 tablet (325 mg total) by mouth 2 (two) times daily.     cetirizine 10 MG tablet  Commonly known as:  ZYRTEC  Take 10 mg by mouth daily as needed for allergies.  DULoxetine 60 MG capsule  Commonly known as:  CYMBALTA  Take 60 mg by mouth daily.     losartan-hydrochlorothiazide 50-12.5 MG tablet  Commonly known as:  HYZAAR  Take 1 tablet by mouth daily.     methocarbamol 500 MG tablet  Commonly known as:  ROBAXIN  Take 1 tablet (500 mg total) by mouth 2 (two) times daily with a meal.     metoprolol succinate 100 MG 24 hr tablet  Commonly known as:  TOPROL-XL  Take 100 mg by mouth at bedtime.     omeprazole 20 MG capsule  Commonly known as:  PRILOSEC  Take 20 mg by mouth every morning.     oxyCODONE-acetaminophen 5-325 MG tablet  Commonly known as:  ROXICET  Take 1 tablet by mouth every 4 (four) hours as needed.        Diagnostic Studies: Dg Chest 2 View  07/03/2015  CLINICAL DATA:  Preop.  Total knee arthroplasty. EXAM: CHEST  2 VIEW COMPARISON:  11/02/2011 FINDINGS:  Normal heart size. Lungs clear. No pneumothorax. No pleural effusion. IMPRESSION: No active cardiopulmonary disease. Electronically Signed   By: Marybelle Killings M.D.   On: 07/03/2015 12:16    Disposition: 01-Home or Self Care      Discharge Instructions    CPM    Complete by:  As directed   Continuous passive motion machine (CPM):      Use the CPM from 0 to 60  for 5 hours per day.      You may increase by 10 degrees per day.  You may break it up into 2 or 3 sessions per day.      Use CPM for 2 weeks or until you are told to stop.     Call MD / Call 911    Complete by:  As directed   If you experience chest pain or shortness of breath, CALL 911 and be transported to the hospital emergency room.  If you develope a fever above 101 F, pus (white drainage) or increased drainage or redness at the wound, or calf pain, call your surgeon's office.     Constipation Prevention    Complete by:  As directed   Drink plenty of fluids.  Prune juice may be helpful.  You may use a stool softener, such as Colace (over the counter) 100 mg twice a day.  Use MiraLax (over the counter) for constipation as needed.     Diet - low sodium heart healthy    Complete by:  As directed      Driving restrictions    Complete by:  As directed   No driving for 2 weeks     Increase activity slowly as tolerated    Complete by:  As directed      Patient may shower    Complete by:  As directed   You may shower without a dressing once there is no drainage.  Do not wash over the wound.  If drainage remains, cover wound with plastic wrap and then shower.           Follow-up Information    Follow up with Kerin Salen, MD In 2 weeks.   Specialty:  Orthopedic Surgery   Contact information:   [place] [address] [phone]        Signed: Hardin Negus ERIC R 07/15/2015, 7:59 AM

## 2015-07-15 NOTE — Progress Notes (Signed)
Orthopedic Tech Progress Note Patient Details:  Leslie Martinez [birth year] [id number] Ortho visit took patient off cpm at 1838 Patient ID: [name], female   DOB: 04-Oct-1965, 50 y.o.   MRN: [id number]   [name] 07/15/2015, 6:38 PM

## 2015-07-15 NOTE — Progress Notes (Signed)
Orthopedic Tech Progress Note Patient Details:  Leslie Martinez 02/03/1966 [id number]  Patient ID: [name], female   DOB: 1965/08/09, 50 y.o.   MRN: [id number] Applied cpm 0-50  [name] 07/15/2015, 9:01 PM

## 2015-07-15 NOTE — Progress Notes (Signed)
Physical Therapy Treatment Patient Details Name: Leslie Martinez MRN: [id number] DOB: 05/31/1965 Today's Date: 07/15/2015    History of Present Illness Pt presents for left TKA. PMH: sleep apnea, HTN, GERD, OA, asthma    PT Comments    Patient continues to make gradual progress toward mobility goals. Continue to progress as tolerated with anticipated d/c to SNF for further skilled PT services.    Follow Up Recommendations  SNF     Equipment Recommendations  Rolling walker with 5" wheels    Recommendations for Other Services       Precautions / Restrictions Precautions Precautions: Knee Precaution Comments: reviewed proper positioning and use of CPM/ zero knee foam. Predominantly educated dtr because pt very sleepy Restrictions Weight Bearing Restrictions: Yes LLE Weight Bearing: Weight bearing as tolerated    Mobility  Bed Mobility               General bed mobility comments: OOB in chair upon arrival  Transfers Overall transfer level: Needs assistance Equipment used: Rolling walker (2 wheeled) Transfers: Sit to/from Stand Sit to Stand: Min assist         General transfer comment: assist to power up into standing and to safely descend to chair with cues for hand placement and technique and increased time to achieve erect posture and transition hand placement to RW  Ambulation/Gait Ambulation/Gait assistance: Min guard Ambulation Distance (Feet): 85 Feet Assistive device: Rolling walker (2 wheeled) Gait Pattern/deviations: Step-to pattern;Decreased stance time - left;Decreased step length - right;Decreased weight shift to left;Antalgic;Trunk flexed Gait velocity: decreased   General Gait Details: cues for posture, engaging L quad before WS to L LE, and position of RW; pt with tendency to maintain L knee flexion during ambulation   Stairs            Wheelchair Mobility    Modified Rankin (Stroke Patients Only)       Balance     Sitting  balance-[name]: Good       Standing balance-[name]: Fair                      Cognition Arousal/Alertness: Awake/alert Behavior During Therapy: Flat affect Overall Cognitive Status: Within Functional Limits for tasks assessed                      Exercises Total Joint Exercises Quad Sets: AROM;Both;10 reps;Seated Short Arc Quad: AAROM;Left;10 reps;Seated Heel Slides: AROM;Left;10 reps;Seated Hip ABduction/ADduction: AROM;Left;10 reps;Seated Straight Leg Raises: AAROM;Left;5 reps;Seated Goniometric ROM: 0-80    General Comments        Pertinent Vitals/Pain Pain Assessment: 0-10 Pain Score: 7  Pain Location: L knee with mobility Pain Descriptors / Indicators: Aching;Sore Pain Intervention(s): Limited activity within patient's tolerance;Monitored during session;Premedicated before session;Repositioned    Home Living                      Prior Function            PT Goals (current goals can now be found in the care plan section) Acute Rehab PT Goals Patient Stated Goal: be more independent PT Goal Formulation: With patient Time For Goal Achievement: 07/20/15 Potential to Achieve Goals: Good Progress towards PT goals: Progressing toward goals    Frequency  7X/week    PT Plan Current plan remains appropriate    Co-evaluation             End of Session Equipment Utilized  During Treatment: Gait belt Activity Tolerance: Patient tolerated treatment well Patient left: in chair;with call bell/phone within reach     Time: [id number] PT Time Calculation (min) (ACUTE ONLY): 23 min  Charges:  $Gait Training: 8-22 mins $Therapeutic Exercise: 8-22 mins                    G Codes:      [place] April, PTA Pager: [phone]   07/15/2015, 11:56 AM

## 2015-07-16 LAB — CBC
HEMATOCRIT: 31.8 % — AB (ref 36.0–46.0)
HEMOGLOBIN: 9.7 g/dL — AB (ref 12.0–15.0)
MCH: 25.7 pg — ABNORMAL LOW (ref 26.0–34.0)
MCHC: 30.5 g/dL (ref 30.0–36.0)
MCV: 84.1 fL (ref 78.0–100.0)
Platelets: 217 10*3/uL (ref 150–400)
RBC: 3.78 MIL/uL — ABNORMAL LOW (ref 3.87–5.11)
RDW: 13.3 % (ref 11.5–15.5)
WBC: 5.8 10*3/uL (ref 4.0–10.5)

## 2015-07-16 LAB — CBC AND DIFFERENTIAL: WBC: 5.8 10*3/mL

## 2015-07-16 NOTE — Progress Notes (Signed)
Orthopedic Tech Progress Note Patient Details:  Leslie Martinez 06/21/1965 [id number]  Patient ID: Leslie Martinez, female   DOB: [birth year], 50 y.o.   MRN: [id number] Applied cpm 0-50  [name] 07/16/2015, 5:26 AM

## 2015-07-16 NOTE — Clinical Social Work Placement (Signed)
   CLINICAL SOCIAL WORK PLACEMENT  NOTE  Date:  07/16/2015  Patient Details  Name: Leslie Martinez MRN: [id number] Date of Birth: Jan 07, 1966  Clinical Social Work is seeking post-discharge placement for this patient at the [place] level of care (*CSW will initial, date and re-position this form in  chart as items are completed):  Yes   Patient/family provided with [place] Work Department's list of facilities offering this level of care within the geographic area requested by the patient (or if unable, by the patient's family).  Yes   Patient/family informed of their freedom to choose among providers that offer the needed level of care, that participate in Medicare, Medicaid or managed care program needed by the patient, have an available bed and are willing to accept the patient.  Yes   Patient/family informed of 's ownership interest in [facility] and [facility], as well as of the fact that they are under no obligation to receive care at these facilities.  PASRR submitted to EDS on 07/14/15     PASRR number received on 07/14/15     Existing PASRR number confirmed on       FL2 transmitted to all facilities in geographic area requested by pt/family on 07/14/15     FL2 transmitted to all facilities within larger geographic area on       Patient informed that his/her managed care company has contracts with or will negotiate with certain facilities, including the following:        Yes   Patient/family informed of bed offers received.  Patient chooses bed at [facility]     Physician recommends and patient chooses bed at      Patient to be transferred to [facility] on 07/16/15.  Patient to be transferred to facility by [facility]     Patient family notified on 07/16/15 of transfer.  Name of family member notified:  Patient     PHYSICIAN Please sign FL2, Please prepare prescriptions     Additional Comment:     _______________________________________________ Caroline Sauger, LCSW 07/16/2015, 11:16 AM

## 2015-07-16 NOTE — Clinical Social Work Note (Signed)
Patient to be discharged to [facility]. Patient updated regarding discharge. Patient to be transported via family.  RN report number: [place], [place] [facility]: [phone] Surgical: [phone]

## 2015-07-16 NOTE — Progress Notes (Signed)
Report called to [facility] spoke with Leslie Martinez pt informed everything approved for her to go called niece for pick up to take to [facility]

## 2015-07-20 ENCOUNTER — Non-Acute Institutional Stay (SKILLED_NURSING_FACILITY): Payer: BC Managed Care – PPO | Admitting: Internal Medicine

## 2015-07-20 ENCOUNTER — Encounter: Payer: Self-pay | Admitting: Internal Medicine

## 2015-07-20 DIAGNOSIS — K5901 Slow transit constipation: Secondary | ICD-10-CM

## 2015-07-20 DIAGNOSIS — I10 Essential (primary) hypertension: Secondary | ICD-10-CM

## 2015-07-20 DIAGNOSIS — F32A Depression, unspecified: Secondary | ICD-10-CM

## 2015-07-20 DIAGNOSIS — F419 Anxiety disorder, unspecified: Secondary | ICD-10-CM | POA: Diagnosis not present

## 2015-07-20 DIAGNOSIS — D62 Acute posthemorrhagic anemia: Secondary | ICD-10-CM

## 2015-07-20 DIAGNOSIS — F329 Major depressive disorder, single episode, unspecified: Secondary | ICD-10-CM

## 2015-07-20 DIAGNOSIS — M1712 Unilateral primary osteoarthritis, left knee: Secondary | ICD-10-CM | POA: Diagnosis not present

## 2015-07-20 DIAGNOSIS — Z8719 Personal history of other diseases of the digestive system: Secondary | ICD-10-CM | POA: Diagnosis not present

## 2015-07-20 DIAGNOSIS — R2681 Unsteadiness on feet: Secondary | ICD-10-CM

## 2015-07-20 NOTE — Progress Notes (Signed)
LOCATION: [facility]  PCP: Rachell Cipro, MD   Code Status: Full Code  Goals of care: Advanced Directive information Advanced Directives 07/14/2015  Does patient have an advance directive? No  Would patient like information on creating an advanced directive? No - patient declined information       Extended Emergency Contact Information Primary Emergency Contact: McCall,Tomeai Address: [place], [place] [address] [place] Phone: [phone] Relation: Daughter Secondary Emergency Contact: McCall,Taletrice  [place] Phone: [phone] Relation: Daughter   Allergies  Allergen Reactions  . Adhesive [Tape] Hives  . Iodine Hives and Rash  . Lisinopril Cough  . Shellfish Allergy Hives  . Cephalexin Hives and Rash  . Latex Itching  . Oxycodone-Acetaminophen Itching and Rash  . Prochlorperazine Edisylate Other (See Comments)    HYPERACTIVITY    Chief Complaint  Patient presents with  . New Admit To SNF    New Admission     HPI:  Patient is a 50 y.o. female seen today for short term rehabilitation post hospital admission from 07/13/15-07/15/15 with primary OA of left knee. She underwnet left total knee arthroplasty. She is seen in her room today. She complaints of being in pain and has muscle spasm. Her pain is 6-7/10 at present. Denies any other concerns.   Review of Systems:  Constitutional: Negative for fever, chills, malaise and diaphoresis. Energy is slowly coming back. HENT: Negative for headache, congestion, nasal discharge, hearing loss, sore throat, difficulty swallowing.   Eyes: Negative for blurred vision, double vision and discharge.  Respiratory: Negative for cough, shortness of breath and wheezing.   Cardiovascular: Negative for chest pain, palpitations, leg swelling.  Gastrointestinal: Negative for heartburn, nausea, vomiting, abdominal pain, loss of appetite, melena, diarrhea and  constipation. Last bowel movement was yesterday. Straining with bowel movement.  Genitourinary: Negative for dysuria and flank pain.  Musculoskeletal: Negative for back pain, fall in the facility.  Skin: Negative for itching, rash.  Neurological: Negative for dizziness. Psychiatric/Behavioral: Negative for depression   Past Medical History  Diagnosis Date  . Depression   . Hypertension   . Angina pectoris ([place])   . Esophagitis   . Esophageal stricture   . Gastritis   . 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 GERD (gastroesophageal reflux disease)   . Asthma     seasonal allergies, dry cough today   . Nephrolithiasis     passed stones spontaneously, also cystoscopy- done   . Arthritis     knees- OA  . Hiatal hernia     history of, treated with surgery   Past Surgical History  Procedure Laterality Date  . Vaginal hysterectomy    . Appendectomy    . Endometrial ablation    . Kidney polyps removed    . Dilation and curettage of uterus    . Knee arthroscopy Bilateral   . Wisdom tooth extraction    . Laparoscopic nissen fundoplication  [id number]    Procedure: LAPAROSCOPIC NISSEN FUNDOPLICATION;  Surgeon: Pedro Earls, MD;  Location: [facility];  Service: General;  Laterality: N/A;  . Upper gi endoscopy  02/03/2012    Procedure: UPPER GI ENDOSCOPY;  Surgeon: Pedro Earls, MD;  Location: [facility];  Service: General;;  . Colonoscopy    . Tonsillectomy  2004    T&A- due to sleep apnea   . Cardiac catheterization  1990's  . Cardiac catheterization    . Tubal ligation    .  Total knee arthroplasty Left 07/13/2015    Procedure: TOTAL KNEE ARTHROPLASTY;  Surgeon: Frederik Pear, MD;  Location: [place];  Service: Orthopedics;  Laterality: Left;   Social History:   reports that she has never smoked. She has never used smokeless tobacco. She reports that she drinks alcohol. She reports that she does not use illicit drugs.  Family History  Problem Relation Age of Onset  . Heart disease Mother   .  Heart disease Father   . Diabetes Father   . Colon cancer Maternal Grandmother     Medications:   Medication List       This list is accurate as of: 07/20/15  3:20 PM.  Always use your most recent med list.               albuterol 108 (90 Base) MCG/ACT inhaler  Commonly known as:  PROVENTIL HFA;VENTOLIN HFA  Inhale 2 puffs into the lungs every 6 (six) hours as needed for wheezing or shortness of breath.     ALPRAZolam 1 MG tablet  Commonly known as:  XANAX  Take 1 mg by mouth at bedtime as needed for anxiety.     aspirin EC 325 MG tablet  Take 1 tablet (325 mg total) by mouth 2 (two) times daily.     cetirizine 10 MG tablet  Commonly known as:  ZYRTEC  Take 10 mg by mouth daily as needed for allergies.     DULoxetine 60 MG capsule  Commonly known as:  CYMBALTA  Take 60 mg by mouth daily.     losartan-hydrochlorothiazide 50-12.5 MG tablet  Commonly known as:  HYZAAR  Take 1 tablet by mouth daily.     methocarbamol 500 MG tablet  Commonly known as:  ROBAXIN  Take 1 tablet (500 mg total) by mouth 2 (two) times daily with a meal.     metoprolol succinate 100 MG 24 hr tablet  Commonly known as:  TOPROL-XL  Take 100 mg by mouth at bedtime.     omeprazole 20 MG capsule  Commonly known as:  PRILOSEC  Take 20 mg by mouth every morning.     oxyCODONE-acetaminophen 5-325 MG tablet  Commonly known as:  ROXICET  Take 1 tablet by mouth every 4 (four) hours as needed.     traMADol 50 MG tablet  Commonly known as:  ULTRAM  Take 50 mg by mouth every 8 (eight) hours as needed for moderate pain.        Immunizations:  There is no immunization history on file for this patient.   Physical Exam Filed Vitals:   07/20/15 1512  BP: 124/77  Pulse: 60  Temp: 97.5 F (36.4 C)  TempSrc: Oral  Resp: 20  Height: 5\' 9"  (1.753 m)  Weight: 254 lb (115.214 kg)  SpO2: 97%   Body mass index is 37.49 kg/(m^2).  General- elderly female, obese, in no acute distress Head-  normocephalic, atraumatic Nose- no maxillary or frontal sinus tenderness, no nasal discharge Throat- moist mucus membrane, normal oropharynx Eyes- PERRLA, EOMI, no pallor, no icterus, no discharge, normal conjunctiva, normal sclera Neck- no cervical lymphadenopathy Cardiovascular- normal s1,s2, no murmur Respiratory- bilateral clear to auscultation, no wheeze, no rhonchi, no crackles Abdomen- bowel sounds present, soft, non tender Musculoskeletal- able to move all 4 extremities, limited left knee range of motion, trace left leg edema Neurological- alert and oriented to person, place and time Skin- warm and dry, left knee surgical incision with aquacel dressing. Psychiatry- normal mood and  affect    Labs reviewed: Basic Metabolic Panel:  Recent Labs  07/03/15 1106 07/14/15 07/14/15 0553  NA 140 136* 136  K 3.7  --  3.7  CL 109  --  102  CO2 24  --  24  GLUCOSE 110*  --  162*  BUN 10 7 7   CREATININE 0.82 0.6 0.61  CALCIUM 9.4  --  8.8*   Liver Function Tests: No results for input(s): AST, ALT, ALKPHOS, BILITOT, PROT, ALBUMIN in the last 8760 hours. No results for input(s): LIPASE, AMYLASE in the last 8760 hours. No results for input(s): AMMONIA in the last 8760 hours. CBC:  Recent Labs  07/03/15 1106 07/14/15 0553 07/15/15 0719 07/16/15 07/16/15 0523  WBC 4.2 5.6 5.7 5.8 5.8  NEUTROABS 2.5  --   --   --   --   HGB 11.6* 10.3* 10.3*  --  9.7*  HCT 35.7* 31.6* 32.0*  --  31.8*  MCV 82.8 82.5 83.8  --  84.1  PLT 145* 204 185  --  217    Radiological Exams: Dg Chest 2 View  07/03/2015  CLINICAL DATA:  Preop.  Total knee arthroplasty. EXAM: CHEST  2 VIEW COMPARISON:  11/02/2011 FINDINGS: Normal heart size. Lungs clear. No pneumothorax. No pleural effusion. IMPRESSION: No active cardiopulmonary disease. Electronically Signed   By: Marybelle Killings M.D.   On: 07/03/2015 12:16    Assessment/Plan  Unsteady gait Post left knee replacement. Will have patient work with PT/OT  as tolerated to regain strength and restore function.  Fall precautions are in place.  Left knee OA S/p left TKA. Has follow up with orthopedics. Will have her work with physical therapy and occupational therapy team to help with gait training and muscle strengthening exercises.fall precautions. Skin care. Encourage to be out of bed. Currently on roxicet 5-325 mg q4h prn, tramadol 50 mg q8h prn with robaxin 500 mg bid. Change robaxin to 500 mg tid. Change tramadol to 50 mg 1-2 tab q6h prn moderate to severe pain and roxicet as above for breakthrough pain. Continue aspirin ec 325 mg bid for dvt prophylaxis.   Blood loss anemia Post op, monitor cbc  Constipation Add senna s 2 tab qhs and encouraged hydration  Anxiety Stable, on xanax 1 mg qhs prn  Depression Stable mood, continue cymbalta  HTN Monitor BP, continue losartan-hctz 50-12.5 mg daily and toprol xl 100 mg daily, check bmp  gerd Stable, continue prilosec 20 mg qd. Has hx of barrett's esophagus   Goals of care: short term rehabilitation   Labs/tests ordered: cbc, bmp 07/21/15  Family/ staff Communication: reviewed care plan with patient and nursing supervisor    Blanchie Serve, MD Internal Medicine [place], [place] [address] Cell Phone (Monday-Friday 8 am - 5 pm): [phone] On Call: [phone] and follow prompts after 5 pm and on weekends Office Phone: [phone] Office Fax: [phone]

## 2015-07-21 ENCOUNTER — Other Ambulatory Visit: Payer: Self-pay | Admitting: *Deleted

## 2015-07-21 MED ORDER — TRAMADOL HCL 50 MG PO TABS: 50.0000 mg | ORAL_TABLET | Freq: Four times a day (QID) | ORAL | Status: DC | PRN

## 2015-07-23 ENCOUNTER — Non-Acute Institutional Stay (SKILLED_NURSING_FACILITY): Payer: BC Managed Care – PPO | Admitting: Family

## 2015-07-23 DIAGNOSIS — M1712 Unilateral primary osteoarthritis, left knee: Secondary | ICD-10-CM | POA: Diagnosis not present

## 2015-07-23 DIAGNOSIS — I10 Essential (primary) hypertension: Secondary | ICD-10-CM | POA: Diagnosis not present

## 2015-07-23 DIAGNOSIS — F411 Generalized anxiety disorder: Secondary | ICD-10-CM | POA: Diagnosis not present

## 2015-07-23 DIAGNOSIS — R269 Unspecified abnormalities of gait and mobility: Secondary | ICD-10-CM | POA: Diagnosis not present

## 2015-07-23 DIAGNOSIS — F329 Major depressive disorder, single episode, unspecified: Secondary | ICD-10-CM

## 2015-07-23 DIAGNOSIS — J4520 Mild intermittent asthma, uncomplicated: Secondary | ICD-10-CM | POA: Diagnosis not present

## 2015-07-23 DIAGNOSIS — F32A Depression, unspecified: Secondary | ICD-10-CM

## 2015-07-23 NOTE — Progress Notes (Signed)
Patient ID: Leslie Martinez, female   DOB: Aug 05, 1965, 50 y.o.   MRN: [id number]  Location:  [place]:  SNF (31)  Provider: Nelda Bucks Karmella Bouvier FNP-C  Blanchie Serve, MD   PCP: Rachell Cipro, MD Patient Care Team: Fanny Bien, MD as PCP - General (Family Medicine)  Extended Emergency Contact Information Primary Emergency Contact: Earley Favor Address: [address], [place] [address] [place] Phone: [phone] Relation: Daughter Secondary Emergency Contact: McCall,Taletrice  [place] Phone: [phone] Relation: Daughter  Code Status: Full Code  Goals of care:  Advanced Directive information Advanced Directives 07/14/2015  Does patient have an advance directive? No  Would patient like information on creating an advanced directive? No - patient declined information     Allergies  Allergen Reactions  . Adhesive [Tape] Hives  . Iodine Hives and Rash  . Lisinopril Cough  . Shellfish Allergy Hives  . Cephalexin Hives and Rash  . Latex Itching  . Oxycodone-Acetaminophen Itching and Rash  . Prochlorperazine Edisylate Other (See Comments)    HYPERACTIVITY    Chief Complaint  Patient presents with  . Discharge Note    HPI:  50 y.o. female seen today at [facility] for discharge home. She was here for short term rehabilitation post hospital admission from 07/13/15-07/15/15 with primary OA of left knee. She underwent left total knee arthroplasty. She has a medical history of HTN, depression, Anxiety, GERD, Asthma among others. She is seen in her room today.She states currently pain medication regimen effective. She denies any acute issues today. She has worked well PT/OT now stable for discharge home. She will be discharged with Home health PT to continue with ROM, exercise, gait stability and muscle strengthening. She will require a front wheel walker to allow her  to maintain current level of independence with ADL's.     Past Medical History  Diagnosis Date  . Depression   . Hypertension   . Angina pectoris ([place])   . Esophagitis   . Esophageal stricture   . Gastritis   . 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 GERD (gastroesophageal reflux disease)   . Asthma     seasonal allergies, dry cough today   . Nephrolithiasis     passed stones spontaneously, also cystoscopy- done   . Arthritis     knees- OA  . Hiatal hernia     history of, treated with surgery    Past Surgical History  Procedure Laterality Date  . Vaginal hysterectomy    . Appendectomy    . Endometrial ablation    . Kidney polyps removed    . Dilation and curettage of uterus    . Knee arthroscopy Bilateral   . Wisdom tooth extraction    . Laparoscopic nissen fundoplication  [id number]    Procedure: LAPAROSCOPIC NISSEN FUNDOPLICATION;  Surgeon: Pedro Earls, MD;  Location: [facility];  Service: General;  Laterality: N/A;  . Upper gi endoscopy  02/03/2012    Procedure: UPPER GI ENDOSCOPY;  Surgeon: Pedro Earls, MD;  Location: [facility];  Service: General;;  . Colonoscopy    . Tonsillectomy  2004    T&A- due to sleep apnea   . Cardiac catheterization  1990's  . Cardiac catheterization    . Tubal ligation    . Total knee arthroplasty Left 07/13/2015    Procedure: TOTAL KNEE ARTHROPLASTY;  Surgeon:  Frederik Pear, MD;  Location: [place];  Service: Orthopedics;  Laterality: Left;      reports that she has never smoked. She has never used smokeless tobacco. She reports that she drinks alcohol. She reports that she does not use illicit drugs. Social History   Social History  . Marital Status: Single    Spouse Name: N/A  . Number of Children: N/A  . Years of Education: N/A   Occupational History  . Not on file.   Social History Main Topics  . Smoking status: Never Smoker   . Smokeless tobacco: Never Used  . Alcohol Use: Yes     Comment: Occasional- socially  . Drug Use: No  .  Sexual Activity: Not on file   Other Topics Concern  . Not on file   Social History Narrative   Functional Status Survey:    Allergies  Allergen Reactions  . Adhesive [Tape] Hives  . Iodine Hives and Rash  . Lisinopril Cough  . Shellfish Allergy Hives  . Cephalexin Hives and Rash  . Latex Itching  . Oxycodone-Acetaminophen Itching and Rash  . Prochlorperazine Edisylate Other (See Comments)    HYPERACTIVITY    Pertinent  Health Maintenance Due  Topic Date Due  . PAP SMEAR  09/03/1985  . MAMMOGRAM  09/04/2014  . COLONOSCOPY  09/04/2014  . INFLUENZA VACCINE  11/03/2015    Medications:   Medication List       This list is accurate as of: 07/23/15 12:58 PM.  Always use your most recent med list.               albuterol 108 (90 Base) MCG/ACT inhaler  Commonly known as:  PROVENTIL HFA;VENTOLIN HFA  Inhale 2 puffs into the lungs every 6 (six) hours as needed for wheezing or shortness of breath.     ALPRAZolam 1 MG tablet  Commonly known as:  XANAX  Take 1 mg by mouth at bedtime as needed for anxiety.     aspirin EC 325 MG tablet  Take 1 tablet (325 mg total) by mouth 2 (two) times daily.     cetirizine 10 MG tablet  Commonly known as:  ZYRTEC  Take 10 mg by mouth daily as needed for allergies.     DULoxetine 60 MG capsule  Commonly known as:  CYMBALTA  Take 60 mg by mouth daily.     losartan-hydrochlorothiazide 50-12.5 MG tablet  Commonly known as:  HYZAAR  Take 1 tablet by mouth daily.     methocarbamol 500 MG tablet  Commonly known as:  ROBAXIN  Take 1 tablet (500 mg total) by mouth 2 (two) times daily with a meal.     metoprolol succinate 100 MG 24 hr tablet  Commonly known as:  TOPROL-XL  Take 100 mg by mouth at bedtime.     omeprazole 20 MG capsule  Commonly known as:  PRILOSEC  Take 20 mg by mouth every morning.     oxyCODONE-acetaminophen 5-325 MG tablet  Commonly known as:  ROXICET  Take 1 tablet by mouth every 4 (four) hours as needed.      traMADol 50 MG tablet  Commonly known as:  ULTRAM  Take 1 tablet (50 mg total) by mouth every 6 (six) hours as needed for moderate pain. Take 2 tablets (100 mg) by mouth every 6 hours as needed for severe pain.        Review of Systems  Constitutional: Negative for fever, chills, activity change and fatigue.  HENT: Negative for ear discharge, rhinorrhea, sinus pressure, sneezing and sore throat.   Eyes: Negative.   Respiratory: Negative for cough, chest tightness, shortness of breath and wheezing.   Cardiovascular: Negative for chest pain, palpitations and leg swelling.  Gastrointestinal: Negative for nausea, vomiting, abdominal pain, diarrhea, constipation and abdominal distention.  Endocrine: Negative.   Genitourinary: Negative.   Musculoskeletal: Positive for gait problem.  Skin: Negative.   Neurological: Negative.   Psychiatric/Behavioral: Negative.     Filed Vitals:   07/23/15 1219  BP: 129/77  Pulse: 88  Temp: 97.5 F (36.4 C)  Resp: 20  Height: 5\' 9"  (1.753 m)  Weight: 258 lb (117.028 kg)  SpO2: 98%   Body mass index is 38.08 kg/(m^2). Physical Exam  Constitutional: She is oriented to person, place, and time. She appears well-developed and well-nourished. No distress.  HENT:  Head: Normocephalic.  Mouth/Throat: Oropharynx is clear and moist.  Eyes: Conjunctivae and EOM are normal. Pupils are equal, round, and reactive to light. Right eye exhibits no discharge. Left eye exhibits no discharge. No scleral icterus.  Neck: Normal range of motion. No JVD present. No thyromegaly present.  Cardiovascular: Normal rate, regular rhythm, normal heart sounds and intact distal pulses.  Exam reveals no gallop and no friction rub.   No murmur heard. Pulmonary/Chest: Effort normal and breath sounds normal. No respiratory distress. She has no wheezes. She has no rales.  Abdominal: Soft. Bowel sounds are normal. She exhibits no distension. There is no tenderness. There is no  rebound and no guarding.  Musculoskeletal: She exhibits no edema or tenderness.  Left knee ROM limited due to pain.  Lymphadenopathy:    She has no cervical adenopathy.  Neurological: She is oriented to person, place, and time.  Skin: Skin is warm and dry. No rash noted. No erythema. No pallor.  Left knee surgical incision dry, clean and intact. No redness, swelling or drainage noted.     Labs reviewed: Basic Metabolic Panel:  Recent Labs  07/03/15 1106 07/14/15 07/14/15 0553  NA 140 136* 136  K 3.7  --  3.7  CL 109  --  102  CO2 24  --  24  GLUCOSE 110*  --  162*  BUN 10 7 7   CREATININE 0.82 0.6 0.61  CALCIUM 9.4  --  8.8*   Liver Function Tests: No results for input(s): AST, ALT, ALKPHOS, BILITOT, PROT, ALBUMIN in the last 8760 hours. No results for input(s): LIPASE, AMYLASE in the last 8760 hours. No results for input(s): AMMONIA in the last 8760 hours. CBC:  Recent Labs  07/03/15 1106 07/14/15 0553 07/15/15 0719 07/16/15 07/16/15 0523  WBC 4.2 5.6 5.7 5.8 5.8  NEUTROABS 2.5  --   --   --   --   HGB 11.6* 10.3* 10.3*  --  9.7*  HCT 35.7* 31.6* 32.0*  --  31.8*  MCV 82.8 82.5 83.8  --  84.1  PLT 145* 204 185  --  217   Cardiac Enzymes: No results for input(s): CKTOTAL, CKMB, CKMBINDEX, TROPONINI in the last 8760 hours. BNP: Invalid input(s): POCBNP CBG: No results for input(s): GLUCAP in the last 8760 hours.  Procedures and Imaging Studies During Stay: Dg Chest 2 View  07/03/2015  CLINICAL DATA:  Preop.  Total knee arthroplasty. EXAM: CHEST  2 VIEW COMPARISON:  11/02/2011 FINDINGS: Normal heart size. Lungs clear. No pneumothorax. No pleural effusion. IMPRESSION: No active cardiopulmonary disease. Electronically Signed   By: Rodena Goldmann.D.  On: 07/03/2015 12:16    Assessment/Plan:   1. Asthma, chronic, mild intermittent, uncomplicated Stable. Exam findings negative for shortness of breath or wheezing. Continue on Proventil HFA as needed.   2. Primary  osteoarthritis of knee, left S/p short term rehabilitation post hospital admission from 07/13/15-07/15/15 with primary OA of left knee. She underwent left total knee arthroplasty. Left knee pain under controlled with current regimen. Wean off pain meds as tolerated. Hard script written X one month supply.Will be discharged with Home health PT to continue with ROM, exercise, gait stability and muscle strengthening. She will require a front wheel walker to allow her to maintain current level of independence with ADL's. Follow up with Ortho as directed.   3. Abnormality of gait S/p left total knee arthroplasty. Home health PT to continue with ROM, exercise, gait stability and muscle strengthening. She will require a front wheel walker to allow her to maintain current level of independence with ADL's.   4. Depression Stable.Continue on Cymbalta 60 mg Capsule daily. PCP to monitor for mood changes.    5. Generalized anxiety disorder Stable. Continue on Xanax PRN   6. Hypertension  B/p stable. Continue on Toprol 100 mg Tablet at bedtime.   Patient is being discharged with the following home health services:    PT to continue with ROM, exercise, gait stability and muscle strengthening.  Patient is being discharged with the following durable medical equipment:   Front wheel walker to allow her to maintain current level of independence with ADL's.  Rx written for month supply.   Patient has been advised to f/u with their PCP in 1-2 weeks to bring them up to date on their rehab stay.  Social services at facility was responsible for arranging this appointment.  Pt was provided with a 30 day supply of prescriptions for medications and refills must be obtained from their PCP.  For controlled substances, a more limited supply may be provided adequate until PCP appointment only.  Future labs/tests needed:  CBC, BMP with PCP

## 2015-08-17 ENCOUNTER — Encounter (HOSPITAL_COMMUNITY): Payer: Self-pay

## 2015-08-17 ENCOUNTER — Emergency Department (HOSPITAL_COMMUNITY)
Admission: EM | Admit: 2015-08-17 | Discharge: 2015-08-17 | Disposition: A | Discharge status: Home / Self Care | Payer: BC Managed Care – PPO | Attending: Emergency Medicine | Admitting: Emergency Medicine

## 2015-08-17 ENCOUNTER — Emergency Department (HOSPITAL_COMMUNITY): Payer: BC Managed Care – PPO

## 2015-08-17 DIAGNOSIS — R197 Diarrhea, unspecified: Secondary | ICD-10-CM | POA: Insufficient documentation

## 2015-08-17 DIAGNOSIS — J45909 Unspecified asthma, uncomplicated: Secondary | ICD-10-CM | POA: Insufficient documentation

## 2015-08-17 DIAGNOSIS — Z79899 Other long term (current) drug therapy: Secondary | ICD-10-CM | POA: Diagnosis not present

## 2015-08-17 DIAGNOSIS — M170 Bilateral primary osteoarthritis of knee: Secondary | ICD-10-CM | POA: Diagnosis not present

## 2015-08-17 DIAGNOSIS — Z7951 Long term (current) use of inhaled steroids: Secondary | ICD-10-CM | POA: Insufficient documentation

## 2015-08-17 DIAGNOSIS — E860 Dehydration: Secondary | ICD-10-CM | POA: Insufficient documentation

## 2015-08-17 DIAGNOSIS — D649 Anemia, unspecified: Secondary | ICD-10-CM | POA: Diagnosis not present

## 2015-08-17 DIAGNOSIS — Z79891 Long term (current) use of opiate analgesic: Secondary | ICD-10-CM | POA: Diagnosis not present

## 2015-08-17 DIAGNOSIS — I10 Essential (primary) hypertension: Secondary | ICD-10-CM | POA: Insufficient documentation

## 2015-08-17 DIAGNOSIS — R109 Unspecified abdominal pain: Secondary | ICD-10-CM

## 2015-08-17 DIAGNOSIS — K219 Gastro-esophageal reflux disease without esophagitis: Secondary | ICD-10-CM | POA: Diagnosis not present

## 2015-08-17 DIAGNOSIS — R1030 Lower abdominal pain, unspecified: Secondary | ICD-10-CM | POA: Diagnosis present

## 2015-08-17 DIAGNOSIS — Z91040 Latex allergy status: Secondary | ICD-10-CM | POA: Diagnosis not present

## 2015-08-17 DIAGNOSIS — Z7982 Long term (current) use of aspirin: Secondary | ICD-10-CM | POA: Diagnosis not present

## 2015-08-17 DIAGNOSIS — F329 Major depressive disorder, single episode, unspecified: Secondary | ICD-10-CM | POA: Diagnosis not present

## 2015-08-17 DIAGNOSIS — R739 Hyperglycemia, unspecified: Secondary | ICD-10-CM | POA: Diagnosis not present

## 2015-08-17 DIAGNOSIS — Z89522 Acquired absence of left knee: Secondary | ICD-10-CM | POA: Diagnosis not present

## 2015-08-17 LAB — URINE MICROSCOPIC-ADD ON
Bacteria, UA: NONE SEEN
WBC, UA: NONE SEEN WBC/hpf (ref 0–5)

## 2015-08-17 LAB — CBC
HCT: 35.1 % — ABNORMAL LOW (ref 36.0–46.0)
Hemoglobin: 11.7 g/dL — ABNORMAL LOW (ref 12.0–15.0)
MCH: 27.1 pg (ref 26.0–34.0)
MCHC: 33.3 g/dL (ref 30.0–36.0)
MCV: 81.3 fL (ref 78.0–100.0)
PLATELETS: 177 10*3/uL (ref 150–400)
RBC: 4.32 MIL/uL (ref 3.87–5.11)
RDW: 13.4 % (ref 11.5–15.5)
WBC: 4 10*3/uL (ref 4.0–10.5)

## 2015-08-17 LAB — COMPREHENSIVE METABOLIC PANEL
ALK PHOS: 94 U/L (ref 38–126)
ALT: 38 U/L (ref 14–54)
AST: 35 U/L (ref 15–41)
Albumin: 4.5 g/dL (ref 3.5–5.0)
Anion gap: 6 (ref 5–15)
BUN: 6 mg/dL (ref 6–20)
CALCIUM: 9.9 mg/dL (ref 8.9–10.3)
CO2: 25 mmol/L (ref 22–32)
CREATININE: 0.69 mg/dL (ref 0.44–1.00)
Chloride: 110 mmol/L (ref 101–111)
GFR calc non Af Amer: >60 mL/min (ref >60)
Glucose, Bld: 141 mg/dL — ABNORMAL HIGH (ref 65–99)
Potassium: 3.7 mmol/L (ref 3.5–5.1)
SODIUM: 141 mmol/L (ref 135–145)
Total Bilirubin: 1 mg/dL (ref 0.3–1.2)
Total Protein: 7.8 g/dL (ref 6.5–8.1)

## 2015-08-17 LAB — URINALYSIS, ROUTINE W REFLEX MICROSCOPIC
BILIRUBIN URINE: NEGATIVE
Glucose, UA: NEGATIVE mg/dL
KETONES UR: NEGATIVE mg/dL
Leukocytes, UA: NEGATIVE
Nitrite: NEGATIVE
PROTEIN: NEGATIVE mg/dL
Specific Gravity, Urine: 1.014 (ref 1.005–1.030)
pH: 7 (ref 5.0–8.0)

## 2015-08-17 LAB — I-STAT TROPONIN, ED: Troponin i, poc: 0 ng/mL (ref 0.00–0.08)

## 2015-08-17 LAB — LIPASE, BLOOD: LIPASE: 16 U/L (ref 11–51)

## 2015-08-17 MED ORDER — SODIUM CHLORIDE 0.9 % IV BOLUS (SEPSIS)
2000.0000 mL | Freq: Once | INTRAVENOUS | Status: AC
  Administered 2015-08-17: 2000 mL via INTRAVENOUS

## 2015-08-17 MED ORDER — ONDANSETRON HCL 4 MG/2ML IJ SOLN
4.0000 mg | Freq: Once | INTRAMUSCULAR | Status: AC
  Administered 2015-08-17: 4 mg via INTRAVENOUS
  Filled 2015-08-17: qty 2

## 2015-08-17 MED ORDER — ONDANSETRON HCL 4 MG/2ML IJ SOLN: 4.0000 mg | Freq: Once | INTRAMUSCULAR | Status: DC | PRN

## 2015-08-17 NOTE — ED Provider Notes (Signed)
CSN: [id number]     Arrival date & time 08/17/15  1537 History   First MD Initiated Contact with Patient 08/17/15 1726     Chief Complaint  Patient presents with  . Abdominal Pain     (Consider location/radiation/quality/duration/timing/severity/associated sxs/prior Treatment) HPI  Complains of crampy abdominal pain, lower onset 2 days ago. Patient has had 4 episodes of watery diarrhea onset today. Pain is intermittent and mild at present. She treated herself with Zofran yesterday. She presently complains of nausea. She's had no vomiting. No known fever admits to chills. No urinary symptoms. No other associated symptoms. Nothing makes symptoms better or worse. Past Medical History  Diagnosis Date  . Depression   . Hypertension   . Angina pectoris ([place])   . Esophagitis   . Esophageal stricture   . Gastritis   . 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 GERD (gastroesophageal reflux disease)   . Asthma     seasonal allergies, dry cough today   . Nephrolithiasis     passed stones spontaneously, also cystoscopy- done   . Arthritis     knees- OA  . Hiatal hernia     history of, treated with surgery  prizmetal's angina Past Surgical History  Procedure Laterality Date  . Vaginal hysterectomy    . Appendectomy    . Endometrial ablation    . Kidney polyps removed    . Dilation and curettage of uterus    . Knee arthroscopy Bilateral   . Wisdom tooth extraction    . Laparoscopic nissen fundoplication  [id number]    Procedure: LAPAROSCOPIC NISSEN FUNDOPLICATION;  Surgeon: Pedro Earls, MD;  Location: [facility];  Service: General;  Laterality: N/A;  . Upper gi endoscopy  02/03/2012    Procedure: UPPER GI ENDOSCOPY;  Surgeon: Pedro Earls, MD;  Location: [facility];  Service: General;;  . Colonoscopy    . Tonsillectomy  2004    T&A- due to sleep apnea   . Cardiac catheterization  1990's  . Cardiac catheterization    . Tubal ligation    . Total knee arthroplasty Left 07/13/2015    Procedure:  TOTAL KNEE ARTHROPLASTY;  Surgeon: Frederik Pear, MD;  Location: [facility];  Service: Orthopedics;  Laterality: Left;  nissen fundoplication Family History  Problem Relation Age of Onset  . Heart disease Mother   . Heart disease Father   . Diabetes Father   . Colon cancer Maternal Grandmother    Social History  Substance Use Topics  . Smoking status: Never Smoker   . Smokeless tobacco: Never Used  . Alcohol Use: Yes     Comment: Occasional- socially   OB History    No data available     Review of Systems  Constitutional: Negative.   HENT: Negative.   Respiratory: Negative.   Cardiovascular: Negative.   Gastrointestinal: Positive for nausea, abdominal pain and diarrhea.  Musculoskeletal: Positive for arthralgias and gait problem.       Left knee pain,, steadily proving since knee recent replacement. Walks with cane  Skin: Negative.   Neurological: Negative.   Psychiatric/Behavioral: Negative.       Allergies  Adhesive; Iodine; Lisinopril; Shellfish allergy; Cephalexin; Latex; Oxycodone-acetaminophen; and Prochlorperazine edisylate  Home Medications   Prior to Admission medications   Medication Sig Start Date End Date Taking? Authorizing Provider  albuterol (PROVENTIL HFA;VENTOLIN HFA) 108 (90 BASE) MCG/ACT inhaler Inhale 2 puffs into the lungs every 6 (six) hours as needed for wheezing or shortness of breath.  Historical Provider, MD  ALPRAZolam Duanne Moron) 1 MG tablet Take 1 mg by mouth at bedtime as needed for anxiety.    Historical Provider, MD  aspirin EC 325 MG tablet Take 1 tablet (325 mg total) by mouth 2 (two) times daily. 07/13/15   Leighton Parody, PA-C  cetirizine (ZYRTEC) 10 MG tablet Take 10 mg by mouth daily as needed for allergies.    Historical Provider, MD  DULoxetine (CYMBALTA) 60 MG capsule Take 60 mg by mouth daily.    Historical Provider, MD  losartan-hydrochlorothiazide (HYZAAR) 50-12.5 MG tablet Take 1 tablet by mouth daily.    Historical Provider, MD   methocarbamol (ROBAXIN) 500 MG tablet Take 1 tablet (500 mg total) by mouth 2 (two) times daily with a meal. Patient taking differently: Take 500 mg by mouth 3 (three) times daily.  07/13/15   Leighton Parody, PA-C  metoprolol (TOPROL-XL) 100 MG 24 hr tablet Take 100 mg by mouth at bedtime.     Historical Provider, MD  omeprazole (PRILOSEC) 20 MG capsule Take 20 mg by mouth every morning.    Historical Provider, MD  oxyCODONE-acetaminophen (ROXICET) 5-325 MG tablet Take 1 tablet by mouth every 4 (four) hours as needed. 07/13/15   Leighton Parody, PA-C  traMADol (ULTRAM) 50 MG tablet Take 1 tablet (50 mg total) by mouth every 6 (six) hours as needed for moderate pain. Take 2 tablets (100 mg) by mouth every 6 hours as needed for severe pain. 07/21/15   Estill Dooms, MD   BP 167/94 mmHg  Pulse 83  Temp(Src) 98.3 F (36.8 C) (Oral)  Resp 16  SpO2 100% Physical Exam  Constitutional: She is oriented to person, place, and time. She appears well-developed and well-nourished. No distress.  HENT:  Head: Normocephalic and atraumatic.  Eyes: Conjunctivae are normal. Pupils are equal, round, and reactive to light.  Neck: Neck supple. No tracheal deviation present. No thyromegaly present.  Cardiovascular: Normal rate and regular rhythm.   No murmur heard. Pulmonary/Chest: Effort normal and breath sounds normal.  Abdominal: Soft. Bowel sounds are normal. She exhibits no distension. There is tenderness.  Obese, mild tenderness at mid abdomen  Musculoskeletal: Normal range of motion. She exhibits no edema or tenderness.  Left lower extremity minimally swollen over left anterior knee. No redness or warmth  Neurological: She is alert and oriented to person, place, and time. Coordination normal.  Skin: Skin is warm and dry. No rash noted.  Psychiatric: She has a normal mood and affect.  Nursing note and vitals reviewed.   ED Course  Procedures (including critical care time) Labs Review Labs Reviewed   COMPREHENSIVE METABOLIC PANEL - Abnormal; Notable for the following:    Glucose, Bld 141 (*)    All other components within normal limits  CBC - Abnormal; Notable for the following:    Hemoglobin 11.7 (*)    HCT 35.1 (*)    All other components within normal limits  LIPASE, BLOOD  URINALYSIS, ROUTINE W REFLEX MICROSCOPIC (NOT AT [facility])    Imaging Review No results found. I have personally reviewed and evaluated these images and lab results as part of my medical decision-making.   EKG Interpretation None    9:30 PM patient feels improved no longer feels nauseated and no longer lightheaded on standing after treatment with intravenous fluids and intravenous Zofran. She does report tingling around her lips and feeling of generalized anxiety. She's had no further episodes diarrhea Results for orders placed or performed during  the hospital encounter of 08/17/15  Lipase, blood  Result Value Ref Range   Lipase 16 11 - 51 U/L  Comprehensive metabolic panel  Result Value Ref Range   Sodium 141 135 - 145 mmol/L   Potassium 3.7 3.5 - 5.1 mmol/L   Chloride 110 101 - 111 mmol/L   CO2 25 22 - 32 mmol/L   Glucose, Bld 141 (H) 65 - 99 mg/dL   BUN 6 6 - 20 mg/dL   Creatinine, Ser 0.69 0.44 - 1.00 mg/dL   Calcium 9.9 8.9 - 10.3 mg/dL   Total Protein 7.8 6.5 - 8.1 g/dL   Albumin 4.5 3.5 - 5.0 g/dL   AST 35 15 - 41 U/L   ALT 38 14 - 54 U/L   Alkaline Phosphatase 94 38 - 126 U/L   Total Bilirubin 1.0 0.3 - 1.2 mg/dL   GFR calc non Af Amer >60 >60 mL/min   GFR calc Af Amer >60 >60 mL/min   Anion gap 6 5 - 15  CBC  Result Value Ref Range   WBC 4.0 4.0 - 10.5 K/uL   RBC 4.32 3.87 - 5.11 MIL/uL   Hemoglobin 11.7 (L) 12.0 - 15.0 g/dL   HCT 35.1 (L) 36.0 - 46.0 %   MCV 81.3 78.0 - 100.0 fL   MCH 27.1 26.0 - 34.0 pg   MCHC 33.3 30.0 - 36.0 g/dL   RDW 13.4 11.5 - 15.5 %   Platelets 177 150 - 400 K/uL  Urinalysis, Routine w reflex microscopic  Result Value Ref Range   Color, Urine YELLOW  YELLOW   APPearance CLEAR CLEAR   Specific Gravity, Urine 1.014 1.005 - 1.030   pH 7.0 5.0 - 8.0   Glucose, UA NEGATIVE NEGATIVE mg/dL   Hgb urine dipstick SMALL (A) NEGATIVE   Bilirubin Urine NEGATIVE NEGATIVE   Ketones, ur NEGATIVE NEGATIVE mg/dL   Protein, ur NEGATIVE NEGATIVE mg/dL   Nitrite NEGATIVE NEGATIVE   Leukocytes, UA NEGATIVE NEGATIVE  Urine microscopic-add on  Result Value Ref Range   Squamous Epithelial / LPF 0-5 (A) NONE SEEN   WBC, UA NONE SEEN 0 - 5 WBC/hpf   RBC / HPF 0-5 0 - 5 RBC/hpf   Bacteria, UA NONE SEEN NONE SEEN  I-stat troponin, ED  Result Value Ref Range   Troponin i, poc 0.00 0.00 - 0.08 ng/mL   Comment 3           Ct Abdomen Pelvis Wo Contrast  08/17/2015  CLINICAL DATA:  50 year old female with mid abdominal pain. Nausea vomiting and diarrhea. EXAM: CT ABDOMEN AND PELVIS WITHOUT CONTRAST TECHNIQUE: Multidetector CT imaging of the abdomen and pelvis was performed following the standard protocol without IV contrast. COMPARISON:  CT dated 11/04/2009 FINDINGS: Evaluation of this exam is limited in the absence of intravenous contrast. The visualized lung bases are clear. No intra-abdominal free air. Trace free fluid within the pelvis. The liver, gallbladder, pancreas, spleen, adrenal glands, kidneys, visualized ureters, and urinary bladder appear unremarkable. Hysterectomy. There is postsurgical changes of Nissen fundoplication. There is apparent diffuse thickening of the colon, likely related to underdistention. Colitis is less likely. Clinical correlation recommended. There is no evidence of bowel obstruction. Appendectomy. The abdominal aorta and IVC appear grossly unremarkable on this noncontrast study. No portal venous gas identified. There is no adenopathy. There is diastases of anterior abdominal wall musculature in the midline with a small fat containing umbilical hernia. A 1.5 cm defect is noted in the anterior peritoneal  wall superior to the umbilicus with  a small fat containing hernia. No drainable fluid collection/abscess. The osseous structures appear unremarkable. IMPRESSION: Mild apparent thickening of the transverse and distal colon, likely related to underdistention. Colitis is less likely but not excluded. Correlation with clinical exam and stool cultures recommended. No evidence of bowel obstruction. No other acute intra-abdominal pelvic pathology identified. Trace free fluid within the pelvis. Electronically Signed   By: Anner Crete M.D.   On: 08/17/2015 19:59    MDM  In light of recent diarrhea and nausea symptoms likely GI in etiology rather than cardiac. She has negative troponin and nonacute EKG. Old records reviewed. Anemia is chronic I'll feel that stool cultures are needed. Doubt colitis. Normal white blood cell count, no fever. Plan avoid dairy. Encourage oral hydration. Imodium as needed for diarrhea. Follow-up with Dr. Ernie Hew. Blood pressure recheck 1 week Diagnoses #1 diarrhea #2 dehydration #3 hyperglycemia #4 anemia #5 elevated blood pressure Final diagnoses:  None        Orlie Dakin, MD 08/17/15 2239

## 2015-08-17 NOTE — Discharge Instructions (Signed)
It is okay to take Zofran as directed for nausea. Make sure that you drink adequately. Drink at least six 8 ounce glasses of water each day. Take Imodium as directed for diarrhea. Avoid milk or foods containing milk such cheese or ice cream while having diarrhea. Call Dr. Ival Bible office tomorrow to schedule appointment for within one week. Asked her to check a test of his hemoglobin A1c. Your blood sugar today was elevated at 141. Your blood pressure should also be rechecked within a week. Today's was elevated at 166/92

## 2015-08-17 NOTE — ED Notes (Signed)
According to EMS, pt c/o generalized abd pain causing dizziness. Pt denies n/v, diarrhea. Pt A+OX4, speaking in complete sentences.

## 2015-08-17 NOTE — ED Notes (Signed)
Pt cannot use restroom at this time, aware urine specimen is needed.  

## 2015-08-17 NOTE — ED Notes (Signed)
MD at bedside. 

## 2015-11-28 ENCOUNTER — Other Ambulatory Visit: Payer: Self-pay | Admitting: Family Medicine

## 2015-11-28 DIAGNOSIS — Z1231 Encounter for screening mammogram for malignant neoplasm of breast: Secondary | ICD-10-CM

## 2016-04-06 MED FILL — OXYCODONE W/APAP 5/325 TAB: 5-325 | 15 days supply | Qty: 30 | Fill #0

## 2016-04-22 ENCOUNTER — Encounter (HOSPITAL_COMMUNITY): Payer: Self-pay | Admitting: Emergency Medicine

## 2016-04-22 ENCOUNTER — Emergency Department (HOSPITAL_COMMUNITY)
Admission: EM | Admit: 2016-04-22 | Discharge: 2016-04-22 | Disposition: A | Discharge status: Home / Self Care | Payer: 59 | Attending: Emergency Medicine | Admitting: Emergency Medicine

## 2016-04-22 ENCOUNTER — Emergency Department (HOSPITAL_COMMUNITY): Payer: 59

## 2016-04-22 DIAGNOSIS — R519 Headache, unspecified: Secondary | ICD-10-CM

## 2016-04-22 DIAGNOSIS — Z96652 Presence of left artificial knee joint: Secondary | ICD-10-CM | POA: Diagnosis not present

## 2016-04-22 DIAGNOSIS — I10 Essential (primary) hypertension: Secondary | ICD-10-CM | POA: Insufficient documentation

## 2016-04-22 DIAGNOSIS — J45909 Unspecified asthma, uncomplicated: Secondary | ICD-10-CM | POA: Diagnosis not present

## 2016-04-22 DIAGNOSIS — H1131 Conjunctival hemorrhage, right eye: Secondary | ICD-10-CM | POA: Insufficient documentation

## 2016-04-22 DIAGNOSIS — Z91040 Latex allergy status: Secondary | ICD-10-CM | POA: Insufficient documentation

## 2016-04-22 DIAGNOSIS — R51 Headache: Secondary | ICD-10-CM | POA: Diagnosis not present

## 2016-04-22 DIAGNOSIS — Z79899 Other long term (current) drug therapy: Secondary | ICD-10-CM | POA: Diagnosis not present

## 2016-04-22 DIAGNOSIS — R079 Chest pain, unspecified: Secondary | ICD-10-CM | POA: Diagnosis not present

## 2016-04-22 LAB — CBC WITH DIFFERENTIAL/PLATELET
BASOS ABS: 0 10*3/uL (ref 0.0–0.1)
BASOS PCT: 1 %
Eosinophils Absolute: 0.1 10*3/uL (ref 0.0–0.7)
Eosinophils Relative: 4 %
HEMATOCRIT: 39.6 % (ref 36.0–46.0)
HEMOGLOBIN: 12.7 g/dL (ref 12.0–15.0)
Lymphocytes Relative: 51 %
Lymphs Abs: 1.8 10*3/uL (ref 0.7–4.0)
MCH: 26.7 pg (ref 26.0–34.0)
MCHC: 32.1 g/dL (ref 30.0–36.0)
MCV: 83.4 fL (ref 78.0–100.0)
MONOS PCT: 8 %
Monocytes Absolute: 0.3 10*3/uL (ref 0.1–1.0)
NEUTROS ABS: 1.2 10*3/uL — AB (ref 1.7–7.7)
NEUTROS PCT: 36 %
Platelets: 181 10*3/uL (ref 150–400)
RBC: 4.75 MIL/uL (ref 3.87–5.11)
RDW: 13.9 % (ref 11.5–15.5)
WBC: 3.4 10*3/uL — AB (ref 4.0–10.5)

## 2016-04-22 LAB — I-STAT CHEM 8, ED
BUN: 6 mg/dL (ref 6–20)
CHLORIDE: 104 mmol/L (ref 101–111)
Calcium, Ion: 1.18 mmol/L (ref 1.15–1.40)
Creatinine, Ser: 0.6 mg/dL (ref 0.44–1.00)
Glucose, Bld: 126 mg/dL — ABNORMAL HIGH (ref 65–99)
HEMATOCRIT: 39 % (ref 36.0–46.0)
HEMOGLOBIN: 13.3 g/dL (ref 12.0–15.0)
POTASSIUM: 3.6 mmol/L (ref 3.5–5.1)
SODIUM: 141 mmol/L (ref 135–145)
TCO2: 27 mmol/L (ref 0–100)

## 2016-04-22 LAB — I-STAT TROPONIN, ED: Troponin i, poc: 0 ng/mL (ref 0.00–0.08)

## 2016-04-22 MED ORDER — LABETALOL HCL 5 MG/ML IV SOLN
10.0000 mg | Freq: Once | INTRAVENOUS | Status: AC
  Administered 2016-04-22: 10 mg via INTRAVENOUS
  Filled 2016-04-22: qty 4

## 2016-04-22 MED ORDER — METOCLOPRAMIDE HCL 5 MG/ML IJ SOLN
10.0000 mg | Freq: Once | INTRAMUSCULAR | Status: AC
  Administered 2016-04-22: 10 mg via INTRAVENOUS
  Filled 2016-04-22: qty 2

## 2016-04-22 NOTE — ED Triage Notes (Signed)
Pt arrives via POv from workplace where pt developed headache this morning while at work. Noted blood pressure to be 222/110. Pt also with rupture blood vessel in right eye. Pt reports some blurred vision, alert, oriented x4, ambulatory, MAE.

## 2016-04-22 NOTE — ED Notes (Signed)
Nurse drawing labs. 

## 2016-04-22 NOTE — ED Provider Notes (Signed)
[facility] DEPT Provider Note   CSN: [id number] Arrival date & time: 04/22/16  [id number]     History   Chief Complaint Chief Complaint  Patient presents with  . Headache  . Hypertension    HPI Leslie Martinez is a 51 y.o. female.  HPI Patient presents with headache. States began this morning. It is dull and in the back of her head. Has occasional headaches. Last night she had nausea and vomiting and felt bad in her mid chest. States that has improved today. She gets occasional headaches but not all that frequently. She has had a reaction to Compazine in the past. She also has a history of hypertension including malignant hypertension. Initial blood pressure elevated. Patient has a subconjunctival hemorrhage on the right. No vision changes. No other bleeding. She says she has been taking her blood pressure medicines.   Past Medical History:  Diagnosis Date  . Angina pectoris ([place])   . Arthritis    knees- OA  . Asthma    seasonal allergies, dry cough today   . 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 Depression   . Esophageal stricture   . Esophagitis   . Gastritis   . GERD (gastroesophageal reflux disease)   . Hiatal hernia    history of, treated with surgery  . Hypertension   . Nephrolithiasis    passed stones spontaneously, also cystoscopy- done     Patient Active Problem List   Diagnosis Date Noted  . Depression 07/23/2015  . Generalized anxiety disorder 07/23/2015  . Primary osteoarthritis of knee 07/13/2015  . Primary osteoarthritis of left knee 07/11/2015  . 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 Lap Nissen Nov 2013 02/06/2012  . Essential hypertension, benign 01/09/2008  . 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 Asthma, chronic 01/09/2008  . BARRETT'S ESOPHAGUS, HX OF 01/09/2008  . NEPHROLITHIASIS, HX OF 01/09/2008  . DYSPHAGIA UNSPECIFIED 11/26/2007    Past Surgical History:  Procedure Laterality Date  . APPENDECTOMY    . CARDIAC  CATHETERIZATION  1990's  . CARDIAC CATHETERIZATION    . COLONOSCOPY    . DILATION AND CURETTAGE OF UTERUS    . ENDOMETRIAL ABLATION    . KIDNEY POLYPS REMOVED    . KNEE ARTHROSCOPY Bilateral   . LAPAROSCOPIC NISSEN FUNDOPLICATION  [id number]   Procedure: LAPAROSCOPIC NISSEN FUNDOPLICATION;  Surgeon: Pedro Earls, MD;  Location: [facility];  Service: General;  Laterality: N/A;  . TONSILLECTOMY  2004   T&A- due to sleep apnea   . TOTAL KNEE ARTHROPLASTY Left 07/13/2015   Procedure: TOTAL KNEE ARTHROPLASTY;  Surgeon: Frederik Pear, MD;  Location: [facility];  Service: Orthopedics;  Laterality: Left;  . TUBAL LIGATION    . UPPER GI ENDOSCOPY  02/03/2012   Procedure: UPPER GI ENDOSCOPY;  Surgeon: Pedro Earls, MD;  Location: [facility];  Service: General;;  . VAGINAL HYSTERECTOMY    . WISDOM TOOTH EXTRACTION      OB History    No data available       Home Medications    Prior to Admission medications   Medication Sig Start Date End Date Taking? Authorizing Provider  albuterol (PROVENTIL HFA;VENTOLIN HFA) 108 (90 BASE) MCG/ACT inhaler Inhale 2 puffs into the lungs every 6 (six) hours as needed for wheezing or shortness of breath.   Yes Historical Provider, MD  ALPRAZolam Duanne Moron) 1 MG tablet Take 1 mg by mouth at bedtime as needed for anxiety.   Yes Historical Provider, MD  bismuth subsalicylate (  PEPTO BISMOL) 262 MG/15ML suspension Take 30 mLs by mouth every 6 (six) hours as needed for indigestion.   Yes Historical Provider, MD  cetirizine (ZYRTEC) 10 MG tablet Take 10 mg by mouth daily as needed for allergies.   Yes Historical Provider, MD  DULoxetine (CYMBALTA) 60 MG capsule Take 60 mg by mouth daily.   Yes Historical Provider, MD  losartan-hydrochlorothiazide (HYZAAR) 50-12.5 MG tablet Take 1 tablet by mouth daily.   Yes Historical Provider, MD  metoprolol (TOPROL-XL) 100 MG 24 hr tablet Take 100 mg by mouth at bedtime.    Yes Historical Provider, MD  traMADol (ULTRAM) 50 MG tablet Take 1  tablet (50 mg total) by mouth every 6 (six) hours as needed for moderate pain. Take 2 tablets (100 mg) by mouth every 6 hours as needed for severe pain. 07/21/15  Yes Estill Dooms, MD    Family History Family History  Problem Relation Age of Onset  . Heart disease Mother   . Heart disease Father   . Diabetes Father   . Colon cancer Maternal Grandmother     Social History Social History  Substance Use Topics  . Smoking status: Never Smoker  . Smokeless tobacco: Never Used  . Alcohol use Yes     Comment: Occasional- socially     Allergies   Adhesive [tape]; Iodine; Lisinopril; Shellfish allergy; Cephalexin; Latex; Oxycodone-acetaminophen; and Prochlorperazine edisylate   Review of Systems Review of Systems  Constitutional: Negative for appetite change.  HENT: Negative for congestion.   Respiratory: Negative for shortness of breath.   Cardiovascular: Positive for chest pain.  Gastrointestinal: Negative for abdominal pain.  Genitourinary: Negative for flank pain.  Musculoskeletal: Negative for back pain.  Skin: Negative for color change and wound.  Neurological: Positive for headaches.  Hematological: Negative for adenopathy.     Physical Exam Updated Vital Signs BP 184/94 (BP Location: Right Arm)   Pulse 64   Temp 98.4 F (36.9 C) (Oral)   Resp 18   SpO2 100%   Physical Exam  Constitutional: She appears well-developed.  HENT:  Head: Atraumatic.  Eyes: No scleral icterus.  Right eye has some conjunctival hemorrhage in the nasal side.  Cardiovascular: Normal rate.   Pulmonary/Chest: Effort normal.  Abdominal: Soft.  Musculoskeletal: She exhibits no edema.  Neurological: She is alert.  Skin: Skin is warm. Capillary refill takes less than 2 seconds.     ED Treatments / Results  Labs (all labs ordered are listed, but only abnormal results are displayed) Labs Reviewed  CBC WITH DIFFERENTIAL/PLATELET - Abnormal; Notable for the following:       Result  Value   WBC 3.4 (*)    Neutro Abs 1.2 (*)    All other components within normal limits  I-STAT CHEM 8, ED - Abnormal; Notable for the following:    Glucose, Bld 126 (*)    All other components within normal limits  I-STAT TROPOININ, ED    EKG  EKG Interpretation  Date/Time:  Friday April 22 2016 22:54:05 EST Ventricular Rate:  61 PR Interval:    QRS Duration: 113 QT Interval:  444 QTC Calculation: 448 R Axis:   91 Text Interpretation:  Sinus rhythm Borderline intraventricular conduction delay Borderline T abnormalities, diffuse leads Confirmed by Alvino Chapel  MD, Markeshia Giebel [phone]) on 04/22/2016 10:59:24 PM       Radiology Dg Chest 2 View  Result Date: 04/22/2016 CLINICAL DATA:  51 y/o  F; chest pains.  History of hypertension. EXAM: CHEST  2 VIEW COMPARISON:  07/03/2015 chest radiograph FINDINGS: The heart size and mediastinal contours are within normal limits and stable. Both lungs are clear. The visualized skeletal structures are unremarkable. IMPRESSION: No active cardiopulmonary disease. Electronically Signed   By: Kristine Garbe M.D.   On: 04/22/2016 22:34    Procedures Procedures (including critical care time)  Medications Ordered in ED Medications  metoCLOPramide (REGLAN) injection 10 mg (10 mg Intravenous Given 04/22/16 2243)  labetalol (NORMODYNE,TRANDATE) injection 10 mg (10 mg Intravenous Given 04/22/16 2247)     Initial Impression / Assessment and Plan / ED Course  I have reviewed the triage vital signs and the nursing notes.  Pertinent labs & imaging results that were available during my care of the patient were reviewed by me and considered in my medical decision making (see chart for details).     Patient With headache. Nonfocal exam and hypertension. Given labetalol and Reglan here. Blood pressure improved somewhat. Patient states headache is improving. I think is reasonable to follow-up as an outpatient. May need some more blood pressure  management. Discharge home.  Final Clinical Impressions(s) / ED Diagnoses   Final diagnoses:  Essential hypertension  Nonintractable headache, unspecified chronicity pattern, unspecified headache type  Subconjunctival bleed, right    New Prescriptions New Prescriptions   No medications on file     Davonna Belling, MD 04/22/16 2315

## 2016-04-25 DIAGNOSIS — F411 Generalized anxiety disorder: Secondary | ICD-10-CM | POA: Diagnosis not present

## 2016-04-25 DIAGNOSIS — Z6836 Body mass index (BMI) 36.0-36.9, adult: Secondary | ICD-10-CM | POA: Diagnosis not present

## 2016-04-25 DIAGNOSIS — I10 Essential (primary) hypertension: Secondary | ICD-10-CM | POA: Diagnosis not present

## 2016-04-25 DIAGNOSIS — G4700 Insomnia, unspecified: Secondary | ICD-10-CM | POA: Diagnosis not present

## 2016-04-25 MED FILL — OLMESARTAN-HCTZ 40-25 MG TA: 40-25 | 90 days supply | Qty: 90 | Fill #0

## 2016-04-25 MED FILL — METOPROLOL SUCC ER 100 MG T: 100 | 30 days supply | Qty: 60 | Fill #0

## 2016-04-25 MED FILL — ALPRAZolam 0.5 MG TABS: 0.5 | 30 days supply | Qty: 30 | Fill #0

## 2016-05-02 ENCOUNTER — Ambulatory Visit
Admission: RE | Admit: 2016-05-02 | Discharge: 2016-05-02 | Disposition: A | Discharge status: Home / Self Care | Payer: 59 | Source: Ambulatory Visit | Attending: Family Medicine | Admitting: Family Medicine

## 2016-05-02 DIAGNOSIS — Z1231 Encounter for screening mammogram for malignant neoplasm of breast: Secondary | ICD-10-CM

## 2016-05-10 DIAGNOSIS — M25562 Pain in left knee: Secondary | ICD-10-CM | POA: Diagnosis not present

## 2016-05-24 MED FILL — ALPRAZolam 0.5 MG TABS: 0.5 | 30 days supply | Qty: 30 | Fill #1

## 2016-05-24 MED FILL — METOPROLOL SUCC ER 100 MG T: 100 | 30 days supply | Qty: 60 | Fill #1

## 2016-05-24 MED FILL — DULoxetine HCL 60 MG CPEP: 60 | 90 days supply | Qty: 90 | Fill #0

## 2016-05-27 IMAGING — CT CT ABD-PELV W/O CM
2 of 4 series · 16 of 46 positions shown, 18 images · non-contrast
Comparison: CT dated 11/04/2009

CLINICAL DATA: 49-year-old female with mid abdominal pain. Nausea
vomiting and diarrhea.

EXAM:
CT ABDOMEN AND PELVIS WITHOUT CONTRAST
TECHNIQUE: Multidetector CT imaging of the abdomen and pelvis was performed
following the standard protocol without IV contrast.

[Series 2: abd/pel w/o · axial · non-contrast · 0.71mm/px · z∈[+1012,+1406]mm · 13 of 89 slices shown, 15 images]
[im 5/89  soft-tissue]
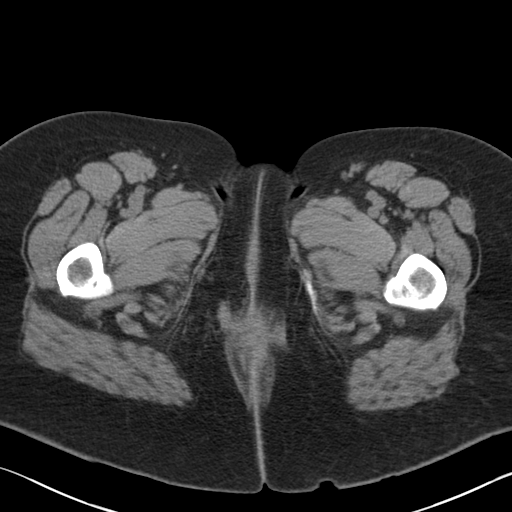
[im 5/89  bone]
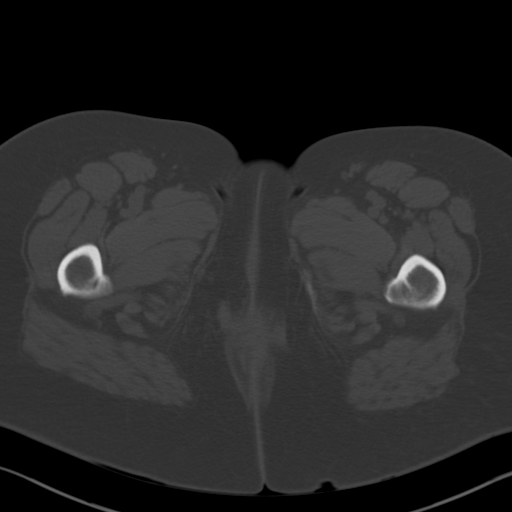
[im 14/89  soft-tissue]
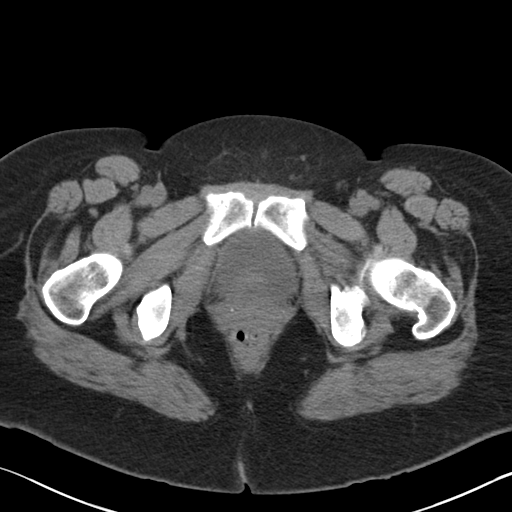
[im 19/89  soft-tissue]
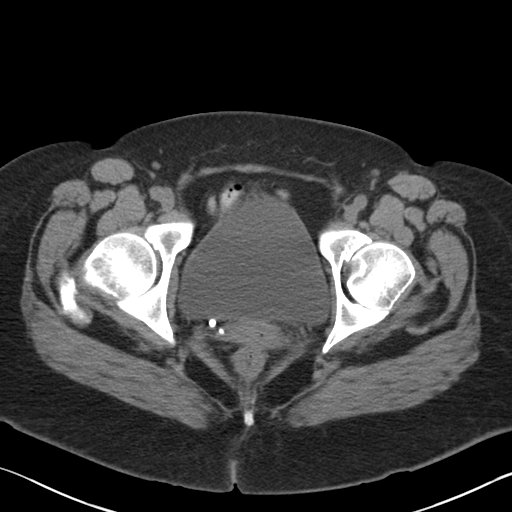
[im 24/89  soft-tissue]
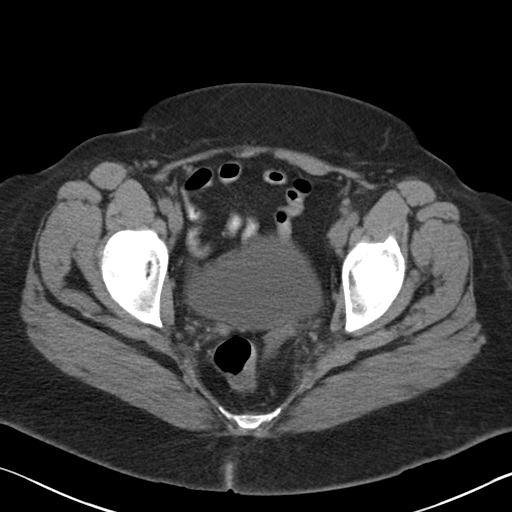
[im 33/89  soft-tissue]
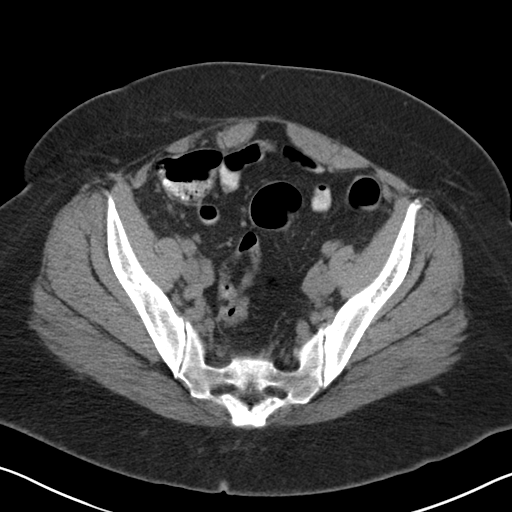
[im 38/89  soft-tissue]
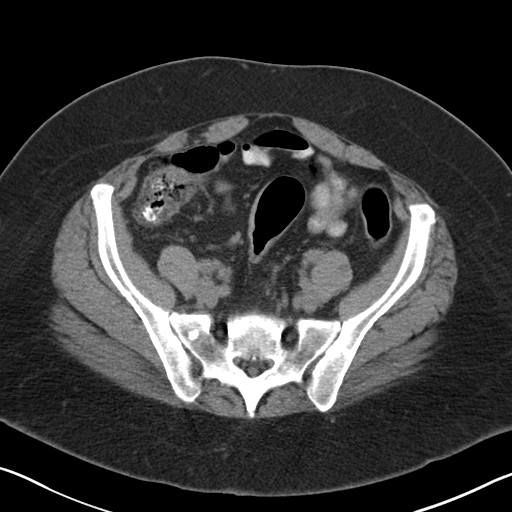
[im 47/89  soft-tissue]
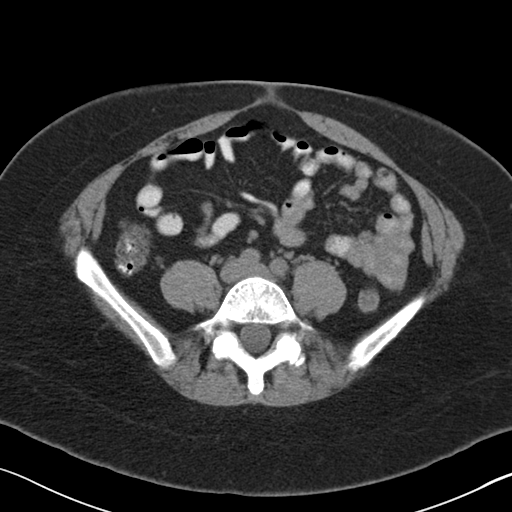
[im 51/89  soft-tissue]
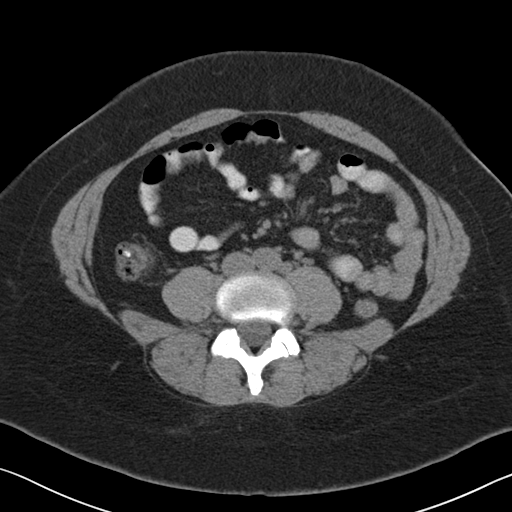
[im 56/89  soft-tissue]
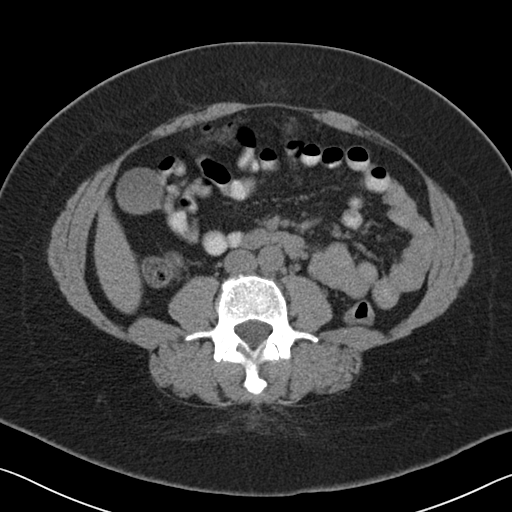
[im 56/89  bone]
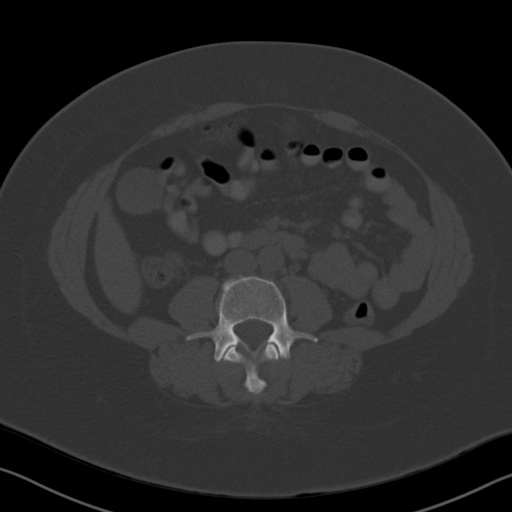
[im 65/89  soft-tissue]
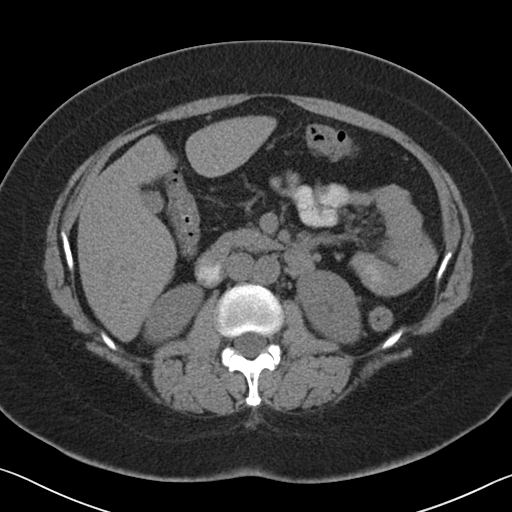
[im 70/89  soft-tissue]
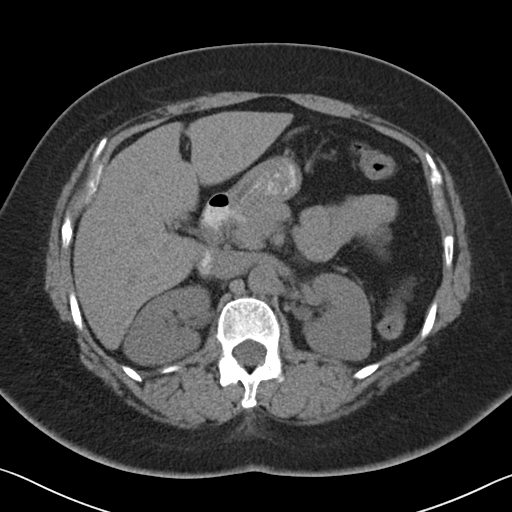
[im 75/89  soft-tissue]
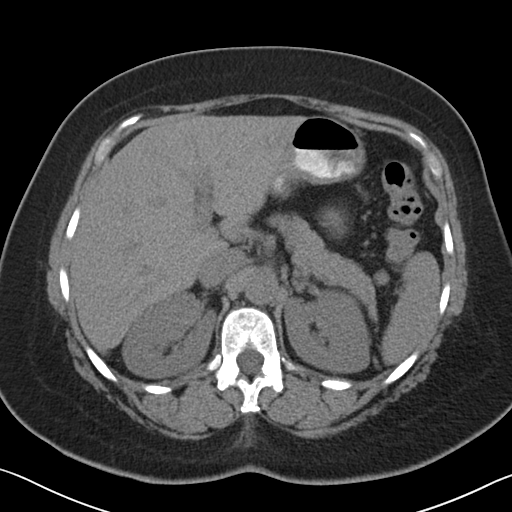
[im 84/89  soft-tissue]
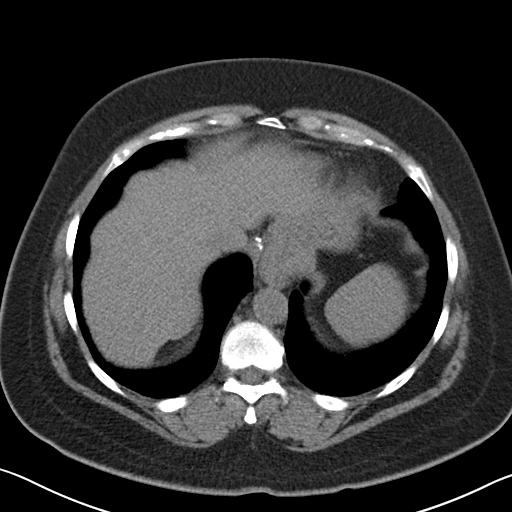

[Series 3: coronal · coronal · 0.72mm/px · 3 of 161 slices shown]
[im 54/161  soft-tissue]
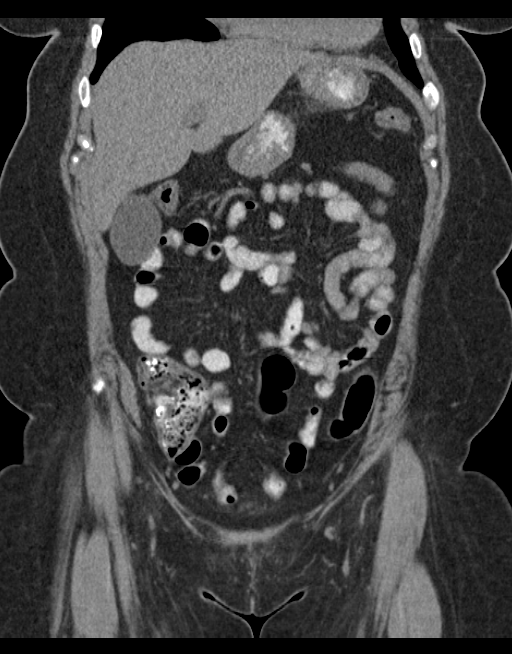
[im 72/161  soft-tissue]
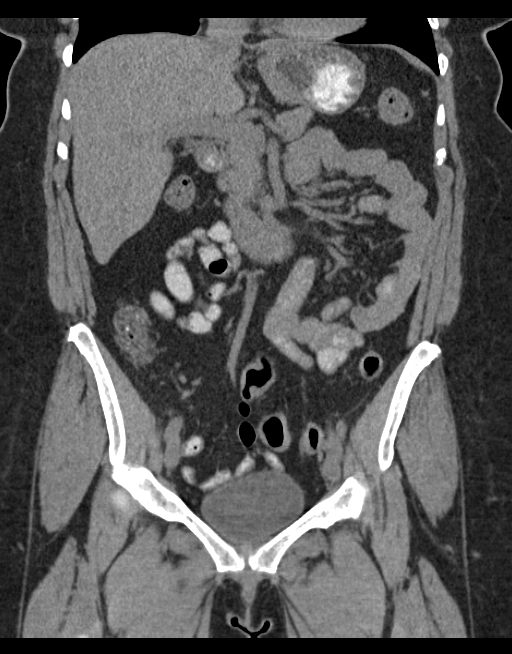
[im 89/161  soft-tissue]
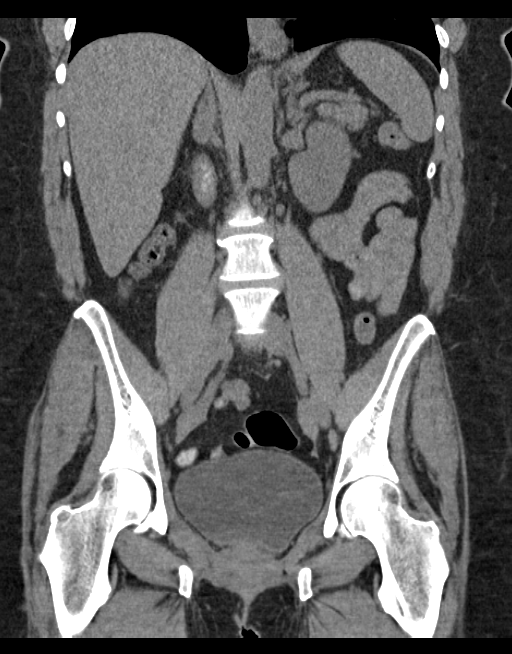

[16 of 46 positions shown; findings below may reference images not displayed]

FINDINGS: Evaluation of this exam is limited in the absence of intravenous
contrast.

The visualized lung bases are clear. No intra-abdominal free air.
Trace free fluid within the pelvis.

The liver, gallbladder, pancreas, spleen, adrenal glands, kidneys,
visualized ureters, and urinary bladder appear unremarkable.
Hysterectomy.

There is postsurgical changes of Nissen fundoplication. There is
apparent diffuse thickening of the colon, likely related to
underdistention. Colitis is less likely. Clinical correlation
recommended. There is no evidence of bowel obstruction.
Appendectomy.

The abdominal aorta and IVC appear grossly unremarkable on this
noncontrast study. No portal venous gas identified. There is no
adenopathy. There is diastases of anterior abdominal wall
musculature in the midline with a small fat containing umbilical
hernia. A 1.5 cm defect is noted in the anterior peritoneal wall
superior to the umbilicus with a small fat containing hernia. No
drainable fluid collection/abscess. The osseous structures appear
unremarkable.
IMPRESSION: Mild apparent thickening of the transverse and distal colon, likely
related to underdistention. Colitis is less likely but not excluded.
Correlation with clinical exam and stool cultures recommended. No
evidence of bowel obstruction. No other acute intra-abdominal pelvic
pathology identified.

Trace free fluid within the pelvis.

## 2016-06-17 ENCOUNTER — Telehealth: Payer: 59 | Admitting: Nurse Practitioner

## 2016-06-17 DIAGNOSIS — J111 Influenza due to unidentified influenza virus with other respiratory manifestations: Secondary | ICD-10-CM

## 2016-06-17 MED ORDER — OSELTAMIVIR PHOSPHATE 75 MG PO CAPS: 75.0000 mg | ORAL_CAPSULE | Freq: Two times a day (BID) | ORAL | 0 refills | Status: DC

## 2016-06-17 NOTE — Progress Notes (Signed)

## 2016-06-27 MED FILL — METOPROLOL SUCC ER 100 MG T: 100 | 30 days supply | Qty: 60 | Fill #2

## 2016-06-27 MED FILL — ALPRAZolam 0.5 MG TABS: 0.5 | 30 days supply | Qty: 30 | Fill #2

## 2016-07-08 ENCOUNTER — Encounter: Payer: Self-pay | Admitting: Internal Medicine

## 2016-07-20 MED FILL — OXYCODONE-ACETAMINOPHEN 5-3: 5-325 | 15 days supply | Qty: 30 | Fill #0

## 2016-08-11 ENCOUNTER — Encounter: Payer: Self-pay | Admitting: Gastroenterology

## 2016-08-11 ENCOUNTER — Other Ambulatory Visit (INDEPENDENT_AMBULATORY_CARE_PROVIDER_SITE_OTHER): Payer: 59

## 2016-08-11 ENCOUNTER — Ambulatory Visit (INDEPENDENT_AMBULATORY_CARE_PROVIDER_SITE_OTHER): Payer: 59 | Admitting: Gastroenterology

## 2016-08-11 VITALS — BP 134/108 | HR 72 | Ht 69.0 in | Wt 261.2 lb

## 2016-08-11 DIAGNOSIS — R12 Heartburn: Secondary | ICD-10-CM | POA: Diagnosis not present

## 2016-08-11 DIAGNOSIS — R159 Full incontinence of feces: Secondary | ICD-10-CM

## 2016-08-11 DIAGNOSIS — R1310 Dysphagia, unspecified: Secondary | ICD-10-CM

## 2016-08-11 DIAGNOSIS — R197 Diarrhea, unspecified: Secondary | ICD-10-CM

## 2016-08-11 LAB — CBC WITH DIFFERENTIAL/PLATELET
BASOS PCT: 0.6 % (ref 0.0–3.0)
Basophils Absolute: 0 10*3/uL (ref 0.0–0.1)
EOS ABS: 0.2 10*3/uL (ref 0.0–0.7)
EOS PCT: 3.1 % (ref 0.0–5.0)
HEMATOCRIT: 36.7 % (ref 36.0–46.0)
HEMOGLOBIN: 12 g/dL (ref 12.0–15.0)
LYMPHS PCT: 55.1 % — AB (ref 12.0–46.0)
Lymphs Abs: 3.1 10*3/uL (ref 0.7–4.0)
MCHC: 32.7 g/dL (ref 30.0–36.0)
MCV: 84.6 fl (ref 78.0–100.0)
MONOS PCT: 8 % (ref 3.0–12.0)
Monocytes Absolute: 0.4 10*3/uL (ref 0.1–1.0)
NEUTROS ABS: 1.8 10*3/uL (ref 1.4–7.7)
Neutrophils Relative %: 33.2 % — ABNORMAL LOW (ref 43.0–77.0)
PLATELETS: 176 10*3/uL (ref 150.0–400.0)
RBC: 4.34 Mil/uL (ref 3.87–5.11)
RDW: 13.5 % (ref 11.5–15.5)
WBC: 5.5 10*3/uL (ref 4.0–10.5)

## 2016-08-11 LAB — COMPREHENSIVE METABOLIC PANEL
ALBUMIN: 4.2 g/dL (ref 3.5–5.2)
ALT: 15 U/L (ref 0–35)
AST: 19 U/L (ref 0–37)
Alkaline Phosphatase: 83 U/L (ref 39–117)
BUN: 12 mg/dL (ref 6–23)
CALCIUM: 9.5 mg/dL (ref 8.4–10.5)
CHLORIDE: 104 meq/L (ref 96–112)
CO2: 27 mEq/L (ref 19–32)
CREATININE: 0.81 mg/dL (ref 0.40–1.20)
GFR: 95.51 mL/min (ref >60.00)
Glucose, Bld: 245 mg/dL — ABNORMAL HIGH (ref 70–99)
Potassium: 3.6 mEq/L (ref 3.5–5.1)
Sodium: 139 mEq/L (ref 135–145)
Total Bilirubin: 0.4 mg/dL (ref 0.2–1.2)
Total Protein: 7.2 g/dL (ref 6.0–8.3)

## 2016-08-11 LAB — IGA: IgA: 169 mg/dL (ref 68–378)

## 2016-08-11 LAB — VITAMIN B12: VITAMIN B 12: 345 pg/mL (ref 211–911)

## 2016-08-11 LAB — FOLATE: FOLATE: 10.7 ng/mL (ref >5.9)

## 2016-08-11 LAB — HIGH SENSITIVITY CRP: CRP HIGH SENSITIVITY: 1.79 mg/L (ref 0.000–5.000)

## 2016-08-11 MED ORDER — NA SULFATE-K SULFATE-MG SULF 17.5-3.13-1.6 GM/177ML PO SOLN: 1.0000 | Freq: Once | ORAL | 0 refills | Status: AC

## 2016-08-11 MED FILL — OLMESARTAN-HCTZ 40-25 MG TA: 40-25 | 90 days supply | Qty: 90 | Fill #1

## 2016-08-11 MED FILL — SUPREP BOWEL PREP KIT: 17.5-3.13-1 | 2 days supply | Qty: 354 | Fill #0

## 2016-08-11 MED FILL — ALPRAZolam 0.5 MG TABS: 0.5 | 30 days supply | Qty: 30 | Fill #3

## 2016-08-11 NOTE — Progress Notes (Signed)
Leslie Martinez    [id number]    January 12, 1966  Primary Care Physician:Dewey, Mechele Claude, MD  Referring Physician: Fanny Bien, MD [address] [place], [place] [address]  Chief complaint:  Dysphagia, Diarrhea  HPI: 51 year old female with chronic history of GERD and esophageal stricture status post dilation, status post Nissen fundoplication 3716 here for evaluation with complaints of worsening dysphagia for past 2 months and area for past 5-6 months. She was previously followed by Dr. Olevia Perches, last seen in office August 2013. Patient has had multiple EGDs with evidence of esophageal stricture in 2009, 2011, 2013, 2015 in 2016. On most recent EGD in 2016 she underwent savary dilation to 17 mm. Barium swallow in 2011 showed normal esophageal motility and evidence of distal esophageal stricture. Swallowing difficulty worse with solids or rice especially after Nissen fundoplication. She feels her symptoms are worse in the past 2 months, no difficulty with liquids   Diarrhea worse in the past 6-7 months , she is was having multiple bowel movements with nocturnal symptoms , somewhat improved after she started taking Imodium 4 tabs in the morning, 2 more tabs in the afternoon. She continues to have increased bowel frequency especially worse during night, on some nights she wakes up 6-7 times with loose movements and also has had episodes of fecal incontinence. Denies any blood in stool. No skin rash or joint pain . No record of colonoscopy here but patient thinks she has had a colonoscopy in [place].  Outpatient Encounter Prescriptions as of 08/11/2016  Medication Sig  . olmesartan-hydrochlorothiazide (BENICAR HCT) 40-25 MG tablet Take 1 tablet by mouth daily.  Marland Kitchen albuterol (PROVENTIL HFA;VENTOLIN HFA) 108 (90 BASE) MCG/ACT inhaler Inhale 2 puffs into the lungs every 6 (six) hours as needed for wheezing or shortness of breath.  . ALPRAZolam (XANAX) 1 MG tablet Take 1 mg by mouth  at bedtime as needed for anxiety.  . bismuth subsalicylate (PEPTO BISMOL) 262 MG/15ML suspension Take 30 mLs by mouth every 6 (six) hours as needed for indigestion.  . cetirizine (ZYRTEC) 10 MG tablet Take 10 mg by mouth daily as needed for allergies.  . DULoxetine (CYMBALTA) 60 MG capsule Take 60 mg by mouth daily.  . metoprolol (TOPROL-XL) 100 MG 24 hr tablet Take 200 mg by mouth at bedtime.   . traMADol (ULTRAM) 50 MG tablet Take 1 tablet (50 mg total) by mouth every 6 (six) hours as needed for moderate pain. Take 2 tablets (100 mg) by mouth every 6 hours as needed for severe pain.  . [DISCONTINUED] losartan-hydrochlorothiazide (HYZAAR) 50-12.5 MG tablet Take 1 tablet by mouth daily.  . [DISCONTINUED] oseltamivir (TAMIFLU) 75 MG capsule Take 1 capsule (75 mg total) by mouth 2 (two) times daily.   No facility-administered encounter medications on file as of 08/11/2016.     Allergies as of 08/11/2016 - Review Complete 08/11/2016  Allergen Reaction Noted  . Adhesive [tape] Hives 07/03/2015  . Iodine Hives and Rash 11/26/2007  . Lisinopril Cough 11/02/2011  . Cephalexin Hives and Rash 11/26/2007  . Latex Itching 07/03/2015  . Oxycodone-acetaminophen Itching and Rash 07/17/2009  . Prochlorperazine edisylate Other (See Comments) 11/26/2007    Past Medical History:  Diagnosis Date  . Angina pectoris ([facility])   . Arthritis    knees- OA  . Asthma    seasonal allergies, dry cough today   . 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 Depression   . Esophageal stricture   .  Esophagitis   . Gastritis   . GERD (gastroesophageal reflux disease)   . Hiatal hernia    history of, treated with surgery  . Hypertension   . Nephrolithiasis    passed stones spontaneously, also cystoscopy- done     Past Surgical History:  Procedure Laterality Date  . APPENDECTOMY    . CARDIAC CATHETERIZATION  1990's  . CARDIAC CATHETERIZATION    . COLONOSCOPY    . DILATION AND CURETTAGE OF UTERUS    . ENDOMETRIAL ABLATION     . KIDNEY POLYPS REMOVED    . KNEE ARTHROSCOPY Bilateral   . LAPAROSCOPIC NISSEN FUNDOPLICATION  05/11/2534   Procedure: LAPAROSCOPIC NISSEN FUNDOPLICATION;  Surgeon: Pedro Earls, MD;  Location: [facility];  Service: General;  Laterality: N/A;  . TONSILLECTOMY  2004   T&A- due to sleep apnea   . TOTAL KNEE ARTHROPLASTY Left 07/13/2015   Procedure: TOTAL KNEE ARTHROPLASTY;  Surgeon: Frederik Pear, MD;  Location: [facility];  Service: Orthopedics;  Laterality: Left;  . TUBAL LIGATION    . UPPER GI ENDOSCOPY  02/03/2012   Procedure: UPPER GI ENDOSCOPY;  Surgeon: Pedro Earls, MD;  Location: [facility];  Service: General;;  . VAGINAL HYSTERECTOMY    . WISDOM TOOTH EXTRACTION      Family History  Problem Relation Age of Onset  . Heart disease Mother   . Heart disease Father   . Diabetes Father   . Colon cancer Maternal Grandmother     Social History   Social History  . Marital status: Single    Spouse name: N/A  . Number of children: 3  . Years of education: N/A   Occupational History  . LPN    Social History Main Topics  . Smoking status: Never Smoker  . Smokeless tobacco: Never Used  . Alcohol use Yes     Comment: Occasional- socially  . Drug use: No  . Sexual activity: Not on file   Other Topics Concern  . Not on file   Social History Narrative  . No narrative on file      Review of systems: Review of Systems  Constitutional: Negative for fever and chills.  HENT: Negative.   Eyes: Negative for blurred vision.  Respiratory: Negative for cough, shortness of breath and wheezing.   Cardiovascular: Negative for chest pain and palpitations.  Gastrointestinal: as per HPI Genitourinary: Negative for dysuria, urgency, frequency and hematuria.  Musculoskeletal: Negative for myalgias, back pain and joint pain.  Skin: Negative for itching and rash.  Neurological: Negative for dizziness, tremors, focal weakness, seizures and loss of consciousness.  Endo/Heme/Allergies:  Positive for seasonal allergies.  Psychiatric/Behavioral: Negative for depression, suicidal ideas and hallucinations.  All other systems reviewed and are negative.   Physical Exam: Vitals:   08/11/16 0846  BP: (!) 134/108  Pulse: 72   Body mass index is 38.58 kg/m. Gen:      No acute distress HEENT:  EOMI, sclera anicteric Neck:     No masses; no thyromegaly Lungs:    Clear to auscultation bilaterally; normal respiratory effort CV:         Regular rate and rhythm; no murmurs Abd:      + bowel sounds; soft, non-tender; no palpable masses, no distension Ext:    No edema; adequate peripheral perfusion Skin:      Warm and dry; no rash Neuro: alert and oriented x 3 Psych: normal mood and affect  Data Reviewed:  Reviewed labs, radiology imaging, old records  and pertinent past GI work up   Assessment and Plan/Recommendations:  51 year old female with history of chronic GERD, obesity, recurrent esophageal stricture, status post Nissen fundoplication 0045 here with complaints of worsening solid dysphagia and chronic diarrhea  Solid Dysphagia: Schedule for EGD with possible dilation of distal esophagus  Chronic Diarrhea: Unclear etiology Follow-up CBC, CMP, IgA TTG, CRP, GI pathogen panel, fecal fat and elastase Schedule for colonoscopy for evaluation The risks and benefits as well as alternatives of endoscopic procedure(s) have been discussed and reviewed. All questions answered. The patient agrees to proceed.    Damaris Hippo , MD [phone] Mon-Fri 8a-5p [phone] after 5p, weekends, holidays  CC: Fanny Bien, MD

## 2016-08-11 NOTE — Patient Instructions (Signed)
Go to the basement for labs today  You have been scheduled for an endoscopy and colonoscopy. Please follow the written instructions given to you at your visit today. Please pick up your prep supplies at the pharmacy within the next 1-3 days. If you use inhalers (even only as needed), please bring them with you on the day of your procedure. Your physician has requested that you go to www.startemmi.com and enter the access code given to you at your visit today. This web site gives a general overview about your procedure. However, you should still follow specific instructions given to you by our office regarding your preparation for the procedure. 

## 2016-08-12 ENCOUNTER — Other Ambulatory Visit: Payer: Self-pay | Admitting: Gastroenterology

## 2016-08-12 ENCOUNTER — Other Ambulatory Visit: Payer: 59

## 2016-08-12 DIAGNOSIS — R12 Heartburn: Secondary | ICD-10-CM

## 2016-08-12 DIAGNOSIS — R159 Full incontinence of feces: Secondary | ICD-10-CM

## 2016-08-12 DIAGNOSIS — R197 Diarrhea, unspecified: Secondary | ICD-10-CM

## 2016-08-12 DIAGNOSIS — R1310 Dysphagia, unspecified: Secondary | ICD-10-CM | POA: Diagnosis not present

## 2016-08-12 LAB — TISSUE TRANSGLUTAMINASE, IGA: Tissue Transglutaminase Ab, IgA: 1 U/mL (ref <4)

## 2016-08-15 ENCOUNTER — Ambulatory Visit (AMBULATORY_SURGERY_CENTER): Payer: 59 | Admitting: Gastroenterology

## 2016-08-15 ENCOUNTER — Encounter: Payer: Self-pay | Admitting: Gastroenterology

## 2016-08-15 VITALS — BP 111/55 | HR 60 | Temp 97.5°F | Resp 13 | Ht 69.0 in | Wt 261.0 lb

## 2016-08-15 DIAGNOSIS — K2210 Ulcer of esophagus without bleeding: Secondary | ICD-10-CM | POA: Diagnosis not present

## 2016-08-15 DIAGNOSIS — R197 Diarrhea, unspecified: Secondary | ICD-10-CM | POA: Diagnosis not present

## 2016-08-15 DIAGNOSIS — I10 Essential (primary) hypertension: Secondary | ICD-10-CM | POA: Diagnosis not present

## 2016-08-15 DIAGNOSIS — E669 Obesity, unspecified: Secondary | ICD-10-CM | POA: Diagnosis not present

## 2016-08-15 DIAGNOSIS — K2270 Barrett's esophagus without dysplasia: Secondary | ICD-10-CM | POA: Diagnosis not present

## 2016-08-15 DIAGNOSIS — K219 Gastro-esophageal reflux disease without esophagitis: Secondary | ICD-10-CM | POA: Diagnosis not present

## 2016-08-15 DIAGNOSIS — R1310 Dysphagia, unspecified: Secondary | ICD-10-CM | POA: Diagnosis present

## 2016-08-15 DIAGNOSIS — J45909 Unspecified asthma, uncomplicated: Secondary | ICD-10-CM | POA: Diagnosis not present

## 2016-08-15 DIAGNOSIS — R159 Full incontinence of feces: Secondary | ICD-10-CM | POA: Diagnosis not present

## 2016-08-15 DIAGNOSIS — G4733 Obstructive sleep apnea (adult) (pediatric): Secondary | ICD-10-CM | POA: Diagnosis not present

## 2016-08-15 LAB — GASTROINTESTINAL PATHOGEN PANEL PCR
C. DIFFICILE TOX A/B, PCR: NOT DETECTED
CRYPTOSPORIDIUM, PCR: NOT DETECTED
Campylobacter, PCR: NOT DETECTED
E COLI 0157, PCR: NOT DETECTED
E coli (ETEC) LT/ST PCR: NOT DETECTED
E coli (STEC) stx1/stx2, PCR: NOT DETECTED
GIARDIA LAMBLIA, PCR: NOT DETECTED
Norovirus, PCR: NOT DETECTED
Rotavirus A, PCR: NOT DETECTED
Salmonella, PCR: NOT DETECTED
Shigella, PCR: NOT DETECTED

## 2016-08-15 MED ORDER — SODIUM CHLORIDE 0.9 % IV SOLN: 500.0000 mL | INTRAVENOUS | Status: DC

## 2016-08-15 MED ORDER — OMEPRAZOLE 40 MG PO CPDR: 40.0000 mg | DELAYED_RELEASE_CAPSULE | Freq: Every day | ORAL | 3 refills | Status: AC

## 2016-08-15 NOTE — Op Note (Signed)
[facility] Patient Name: Leslie Martinez Procedure Date: 08/15/2016 2:54 PM MRN: [id number] Endoscopist: Mauri Pole , MD Age: 51 Referring MD:  Date of Birth: 1965-07-03 Gender: Female Account #: [id number] Procedure:                Upper GI endoscopy Indications:              Dysphagia, Heartburn Medicines:                Monitored Anesthesia Care Procedure:                Pre-Anesthesia Assessment:                           - Prior to the procedure, a History and Physical                            was performed, and patient medications and                            allergies were reviewed. The patient's tolerance of                            previous anesthesia was also reviewed. The risks                            and benefits of the procedure and the sedation                            options and risks were discussed with the patient.                            All questions were answered, and informed consent                            was obtained. Prior Anticoagulants: The patient has                            taken no previous anticoagulant or antiplatelet                            agents. ASA Grade Assessment: II - A patient with                            mild systemic disease. After reviewing the risks                            and benefits, the patient was deemed in                            satisfactory condition to undergo the procedure.                           After obtaining informed consent, the endoscope was  passed under direct vision. Throughout the                            procedure, the patient's blood pressure, pulse, and                            oxygen saturations were monitored continuously. The                            Model GIF-[id number]) scope was introduced                            through the mouth, and advanced to the second part                            of duodenum. The upper GI  endoscopy was                            accomplished without difficulty. The patient                            tolerated the procedure well. Scope In: Scope Out: Findings:                 Two superficial esophageal ulcers with no bleeding                            and no stigmata of recent bleeding were found 31 to                            35 cm from the incisors. The largest lesion was 5                            mm in largest dimension. Biopsies were taken with a                            cold forceps for histology.                           A 3 cm hiatal hernia was present.                           Evidence of a Nissen fundoplication was found in                            the cardia. The wrap appeared not intact. This was                            traversed.                           The stomach was normal.                           The examined duodenum was  normal. Complications:            No immediate complications. Estimated Blood Loss:     Estimated blood loss was minimal. Impression:               - Non-bleeding esophageal ulcers. Biopsied.                           - 3 cm hiatal hernia.                           - A Nissen fundoplication was found. The wrap                            appears not intact.                           - Normal stomach.                           - Normal examined duodenum. Recommendation:           - Resume previous diet.                           - Continue present medications.                           - Use Prilosec (omeprazole) 40 mg PO daily.                           - Follow an antireflux regimen.                           - Referral to [facility] for revision of Nissen                            Fundoplication Mauri Pole, MD 08/15/2016 3:45:59 PM This report has been signed electronically.

## 2016-08-15 NOTE — Progress Notes (Signed)
Pt to PACU. Report to RN VSS

## 2016-08-15 NOTE — Progress Notes (Signed)
Called to room to assist during endoscopic procedure.  Patient ID and intended procedure confirmed with present staff. Received instructions for my participation in the procedure from the performing physician.  

## 2016-08-15 NOTE — Patient Instructions (Signed)
Impression/Recommendations:  Hiatal hernia handout given to patient. GERD handout given to patient. Hemorrhoid handout given to patient.  Resume previous diet. Continue present medications.  Use Prilosec (omeprazole) 40 mg. By mouth daily.  Follow antireflux regimen.  Repeat colonoscopy - date to be determined after pathology results are reviewed.  YOU HAD AN ENDOSCOPIC PROCEDURE TODAY AT [facility]:   Refer to the procedure report that was given to you for any specific questions about what was found during the examination.  If the procedure report does not answer your questions, please call your gastroenterologist to clarify.  If you requested that your care partner not be given the details of your procedure findings, then the procedure report has been included in a sealed envelope for you to review at your convenience later.  YOU SHOULD EXPECT: Some feelings of bloating in the abdomen. Passage of more gas than usual.  Walking can help get rid of the air that was put into your GI tract during the procedure and reduce the bloating. If you had a lower endoscopy (such as a colonoscopy or flexible sigmoidoscopy) you may notice spotting of blood in your stool or on the toilet paper. If you underwent a bowel prep for your procedure, you may not have a normal bowel movement for a few days.  Please Note:  You might notice some irritation and congestion in your nose or some drainage.  This is from the oxygen used during your procedure.  There is no need for concern and it should clear up in a day or so.  SYMPTOMS TO REPORT IMMEDIATELY:   Following lower endoscopy (colonoscopy or flexible sigmoidoscopy):  Excessive amounts of blood in the stool  Significant tenderness or worsening of abdominal pains  Swelling of the abdomen that is new, acute  Fever of 100F or higher   Following upper endoscopy (EGD)  Vomiting of blood or coffee ground material  New chest pain or pain under  the shoulder blades  Painful or persistently difficult swallowing  New shortness of breath  Fever of 100F or higher  Black, tarry-looking stools  For urgent or emergent issues, a gastroenterologist can be reached at any hour by calling [phone].   DIET:  We do recommend a small meal at first, but then you may proceed to your regular diet.  Drink plenty of fluids but you should avoid alcoholic beverages for 24 hours.  ACTIVITY:  You should plan to take it easy for the rest of today and you should NOT DRIVE or use heavy machinery until tomorrow (because of the sedation medicines used during the test).    FOLLOW UP: Our staff will call the number listed on your records the next business day following your procedure to check on you and address any questions or concerns that you may have regarding the information given to you following your procedure. If we do not reach you, we will leave a message.  However, if you are feeling well and you are not experiencing any problems, there is no need to return our call.  We will assume that you have returned to your regular daily activities without incident.  If any biopsies were taken you will be contacted by phone or by letter within the next 1-3 weeks.  Please call us at [phone] if you have not heard about the biopsies in 3 weeks.    SIGNATURES/CONFIDENTIALITY: You and/or your care partner have signed paperwork which will be entered into your electronic medical  record.  These signatures attest to the fact that that the information above on your After Visit Summary has been reviewed and is understood.  Full responsibility of the confidentiality of this discharge information lies with you and/or your care-partner. 

## 2016-08-15 NOTE — Op Note (Signed)
[facility] Patient Name: Leslie Martinez Procedure Date: 08/15/2016 3:13 PM MRN: [id number] Endoscopist: Mauri Pole , MD Age: 51 Referring MD:  Date of Birth: 1965/11/28 Gender: Female Account #: [id number] Procedure:                Colonoscopy Indications:              Clinically significant diarrhea of unexplained                            origin Medicines:                Monitored Anesthesia Care Procedure:                Pre-Anesthesia Assessment:                           - Prior to the procedure, a History and Physical                            was performed, and patient medications and                            allergies were reviewed. The patient's tolerance of                            previous anesthesia was also reviewed. The risks                            and benefits of the procedure and the sedation                            options and risks were discussed with the patient.                            All questions were answered, and informed consent                            was obtained. Prior Anticoagulants: The patient has                            taken no previous anticoagulant or antiplatelet                            agents. ASA Grade Assessment: II - A patient with                            mild systemic disease. After reviewing the risks                            and benefits, the patient was deemed in                            satisfactory condition to undergo the procedure.                           -  Prior to the procedure, a History and Physical                            was performed, and patient medications and                            allergies were reviewed. The patient's tolerance of                            previous anesthesia was also reviewed. The risks                            and benefits of the procedure and the sedation                            options and risks were discussed with the patient.                All questions were answered, and informed consent                            was obtained. Prior Anticoagulants: The patient has                            taken no previous anticoagulant or antiplatelet                            agents. ASA Grade Assessment: II - A patient with                            mild systemic disease. After reviewing the risks                            and benefits, the patient was deemed in                            satisfactory condition to undergo the procedure.                           After obtaining informed consent, the colonoscope                            was passed under direct vision. Throughout the                            procedure, the patient's blood pressure, pulse, and                            oxygen saturations were monitored continuously. The                            Model PCF-[id number] [id number]) scope was introduced                            through the  anus and advanced to the the cecum,                            identified by appendiceal orifice and ileocecal                            valve. The colonoscopy was performed without                            difficulty. The patient tolerated the procedure                            well. The quality of the bowel preparation was                            inadequate. The ileocecal valve, appendiceal                            orifice, and rectum were photographed. Scope In: 3:15:58 PM Scope Out: 3:39:52 PM Scope Withdrawal Time: 0 hours 12 minutes 52 seconds  Total Procedure Duration: 0 hours 23 minutes 54 seconds  Findings:                 Weak anal sphincter                           Normal mucosa was found in the entire colon.                            Biopsies for histology were taken with a cold                            forceps from the right colon and left colon for                            evaluation of microscopic colitis. Limited views of                             mucosa in the left colon, due to residual vegetable                            matter.                           Non-bleeding internal hemorrhoids were found during                            retroflexion. The hemorrhoids were small. Complications:            No immediate complications. Estimated Blood Loss:     Estimated blood loss was minimal. Impression:               - Preparation of the colon was inadequate for                            visualization of  polyps.                           - Normal mucosa in the entire visualized colon.                            Biopsied.                           - Non-bleeding internal hemorrhoids. Recommendation:           - Patient has a contact number available for                            emergencies. The signs and symptoms of potential                            delayed complications were discussed with the                            patient. Return to normal activities tomorrow.                            Written discharge instructions were provided to the                            patient.                           - Resume previous diet.                           - Continue present medications.                           - Await pathology results.                           - Repeat colonoscopy date to be determined after                            pending pathology results are reviewed because the                            bowel preparation was suboptimal. Mauri Pole, MD 08/15/2016 3:50:32 PM This report has been signed electronically.

## 2016-08-16 ENCOUNTER — Other Ambulatory Visit: Payer: Self-pay

## 2016-08-16 ENCOUNTER — Telehealth: Payer: Self-pay

## 2016-08-16 ENCOUNTER — Telehealth: Payer: Self-pay | Admitting: Gastroenterology

## 2016-08-16 DIAGNOSIS — K219 Gastro-esophageal reflux disease without esophagitis: Secondary | ICD-10-CM

## 2016-08-16 NOTE — Telephone Encounter (Signed)
  Follow up Call-  Call back number 08/15/2016 10/03/2014  Post procedure Call Back phone  # [phone]  Permission to leave phone message Yes Yes  Some recent data might be hidden     Patient questions:  Do you have a fever, pain , or abdominal swelling? No. Pain Score  0 *  Have you tolerated food without any problems? Yes.    Have you been able to return to your normal activities? Yes.    Do you have any questions about your discharge instructions: Diet   No. Medications  No. Follow up visit  No.  Do you have questions or concerns about your Care? No.  Actions: * If pain score is 4 or above: No action needed, pain <4.

## 2016-08-17 NOTE — Telephone Encounter (Signed)
Referral to [facility] for revision of Nissen Fundoplication Dr Hassell Done pt preference pt has been advised that referral to Dr Hassell Done has been made

## 2016-08-22 LAB — FECAL FAT, QUALITATIVE: FECAL FAT QUALITATIVE: NORMAL

## 2016-08-22 LAB — PANCREATIC ELASTASE, FECAL: PANCREATIC ELASTASE-1, STL: 21 ug/g — AB

## 2016-08-22 NOTE — Telephone Encounter (Signed)
Appointment made with Dr Dalbert Batman on 09/09/2016 at 2pm  [facility] contacted patient

## 2016-08-25 ENCOUNTER — Other Ambulatory Visit: Payer: Self-pay

## 2016-08-25 ENCOUNTER — Telehealth: Payer: Self-pay

## 2016-08-25 MED ORDER — PANCRELIPASE (LIP-PROT-AMYL) 36000-114000 UNITS PO CPEP: ORAL_CAPSULE | ORAL | 3 refills | Status: DC

## 2016-08-25 MED FILL — CREON DR 36,000 UNITS CAP: 36000 | 30 days supply | Qty: 210 | Fill #0

## 2016-08-25 NOTE — Telephone Encounter (Signed)
Notes, demographics and insurance information faxed to [facility].

## 2016-09-08 MED FILL — ALPRAZolam 0.5 MG TABS: 0.5 | 30 days supply | Qty: 30 | Fill #4

## 2016-09-08 MED FILL — DULoxetine HCL 60 MG CPEP: 60 | 90 days supply | Qty: 90 | Fill #1

## 2016-09-29 ENCOUNTER — Other Ambulatory Visit: Payer: Self-pay

## 2016-09-29 ENCOUNTER — Ambulatory Visit: Payer: 59 | Admitting: Gastroenterology

## 2016-10-24 MED FILL — CREON DR 36,000 UNITS CAP: 36000 | 30 days supply | Qty: 210 | Fill #1

## 2016-10-24 MED FILL — METOPROLOL SUCC ER 100 MG T: 100 | 30 days supply | Qty: 60 | Fill #3

## 2016-10-25 MED FILL — OXYCODONE-ACETAMINOPHEN 5-3: 5-325 | 15 days supply | Qty: 30 | Fill #0

## 2016-12-20 MED FILL — CREON DR 36,000 UNITS CAP: 36000 | 30 days supply | Qty: 210 | Fill #2

## 2016-12-27 ENCOUNTER — Ambulatory Visit: Payer: 59 | Admitting: Gastroenterology

## 2017-01-10 ENCOUNTER — Telehealth: Payer: Self-pay | Admitting: Gastroenterology

## 2017-01-10 NOTE — Telephone Encounter (Signed)
Began having vomiting and diarrhea yesterday. Sweats and chills yesterday. No vomiting today. Upper abdomen feels tender and has had 3 stools today. Slight headache. She has only had water today. Advised her it is not likely to be due to the flu vaccine. Please advise.

## 2017-01-10 NOTE — Telephone Encounter (Signed)
Please advise patient to drink adequate fluids to maintain hydration. Unlikely to be related to flu vaccine. Call back if she continues to have persistent symptoms and schedule office visit

## 2017-01-10 NOTE — Telephone Encounter (Signed)
Patient advised. She will call us tomorrow if she is not improving. Discussed clear liquids and advancing her diet only as tolerated.

## 2017-01-11 ENCOUNTER — Other Ambulatory Visit (INDEPENDENT_AMBULATORY_CARE_PROVIDER_SITE_OTHER): Payer: 59

## 2017-01-11 ENCOUNTER — Ambulatory Visit (INDEPENDENT_AMBULATORY_CARE_PROVIDER_SITE_OTHER): Payer: 59 | Admitting: Nurse Practitioner

## 2017-01-11 ENCOUNTER — Encounter (INDEPENDENT_AMBULATORY_CARE_PROVIDER_SITE_OTHER): Payer: Self-pay

## 2017-01-11 ENCOUNTER — Encounter: Payer: Self-pay | Admitting: Nurse Practitioner

## 2017-01-11 VITALS — BP 162/106 | HR 72 | Ht 68.25 in | Wt 258.5 lb

## 2017-01-11 DIAGNOSIS — R1013 Epigastric pain: Secondary | ICD-10-CM

## 2017-01-11 DIAGNOSIS — R112 Nausea with vomiting, unspecified: Secondary | ICD-10-CM

## 2017-01-11 DIAGNOSIS — R197 Diarrhea, unspecified: Secondary | ICD-10-CM

## 2017-01-11 LAB — LIPASE: LIPASE: 14 U/L (ref 11.0–59.0)

## 2017-01-11 LAB — CBC WITH DIFFERENTIAL/PLATELET
Basophils Absolute: 0 10*3/uL (ref 0.0–0.1)
Basophils Relative: 0.7 % (ref 0.0–3.0)
Eosinophils Absolute: 0.2 10*3/uL (ref 0.0–0.7)
Eosinophils Relative: 4.3 % (ref 0.0–5.0)
HCT: 38.5 % (ref 36.0–46.0)
Hemoglobin: 12.3 g/dL (ref 12.0–15.0)
LYMPHS ABS: 2 10*3/uL (ref 0.7–4.0)
Lymphocytes Relative: 52.4 % — ABNORMAL HIGH (ref 12.0–46.0)
MCHC: 32 g/dL (ref 30.0–36.0)
MCV: 84.7 fl (ref 78.0–100.0)
MONO ABS: 0.4 10*3/uL (ref 0.1–1.0)
MONOS PCT: 9 % (ref 3.0–12.0)
NEUTROS ABS: 1.3 10*3/uL — AB (ref 1.4–7.7)
NEUTROS PCT: 33.6 % — AB (ref 43.0–77.0)
PLATELETS: 181 10*3/uL (ref 150.0–400.0)
RBC: 4.55 Mil/uL (ref 3.87–5.11)
RDW: 13.8 % (ref 11.5–15.5)
WBC: 3.9 10*3/uL — ABNORMAL LOW (ref 4.0–10.5)

## 2017-01-11 NOTE — Progress Notes (Signed)
     HPI:  Patient is a  51 yo female followed by Dr. Silverio Decamp. She has a hx of GERD / recurrent esophageal strictures / and Nissen fundoplication. She has chronic diarrhea. Colonoscopy with random biopsies in May was negative for microscopic colitis. Celiac studies negative. Fecal elastase was low. Creon started and stool consistency and frequency have both improved.   [place] is worked in for acute nausea, vomiting, epigastric pain and non-bloody diarrhea which started on Monday. No hematemesis. Subjective fever at onset. No recent antibiotics, no sick contacts.No recent medication changes. The epigastric discomfort radiates through to her back. Pain is constant, worse when having the diarrhea. Pain actually a little better today. Not taking NSAIDs.  She had several ETOH beverages over the weekend. Today she has had two episodes of diarrhea and vomited once. She is able to hold down fluids (gingerale, water). Patient thinks she may of had too much ETOH over the weekend.      Past Medical History:  Diagnosis Date  . Allergy   . Angina pectoris ([place])   . Arthritis    knees- OA  . Asthma    seasonal allergies, dry cough today   . 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 Depression   . Esophageal stricture   . Esophagitis   . Gastritis   . GERD (gastroesophageal reflux disease)   . Hiatal hernia    history of, treated with surgery  . Hypertension   . Nephrolithiasis    passed stones spontaneously, also cystoscopy- done     Patient's surgical history, family medical history, social history, medications and allergies were all reviewed in Epic    Physical Exam: BP (!) 162/106 (BP Location: Left Arm, Patient Position: Sitting, Cuff Size: Large)   Pulse 72   Ht 5' 8.25" (1.734 m) Comment: height measured without shoes  Wt 258 lb 8 oz (117.3 kg)   BMI 39.02 kg/m   GENERAL: obese black female in NAD PSYCH: :Pleasant, cooperative, normal affect EENT:  conjunctiva pink, mucous membranes moist, neck  supple without masses CARDIAC:  RRR, no murmur heard, no peripheral edema PULM: Normal respiratory effort, lungs CTA bilaterally, no wheezing ABDOMEN:  Nondistended, soft, nontender. No obvious masses, no hepatomegaly,  normal bowel sounds SKIN:  turgor, no lesions seen Musculoskeletal:  Normal muscle tone, normal strength NEURO: Alert and oriented x 3, no focal neurologic deficits    ASSESSMENT and PLAN:   Pleasant 51 yo female with acute nausea, vomiting, diarrhea and epigastric pain since Monday. She had a lot of [place] over the weekend, thinks sx may be related and she might be correct. Abdominal exam is negative.  -CBC, lipase -symptomatic treatment for now.  -will call her with labs results and to get a condition update in a day or two.  -resume creon when eating solids   Tye Savoy , NP 01/11/2017, 1:40 PM

## 2017-01-11 NOTE — Telephone Encounter (Signed)
Pt is calling back with an update:  She said she is the same as yesterday and wants to know what the next step will be

## 2017-01-11 NOTE — Telephone Encounter (Signed)
Patient has starting "dry heaving". Her abdomen is hurting now. She would like to be seen.  Appointment scheduled to be evaluated. Patient says she can drive.

## 2017-01-11 NOTE — Patient Instructions (Addendum)
If you are age 52 or older, your body mass index should be between 23-30. Your Body mass index is 39.02 kg/m. If this is out of the aforementioned range listed, please consider follow up with your Primary Care Provider.  If you are age 53 or younger, your body mass index should be between 19-25. Your Body mass index is 39.02 kg/m. If this is out of the aformentioned range listed, please consider follow up with your Primary Care Provider.   Your physician has requested that you go to the basement for the following lab work before leaving today: CBC w/DIff Lipase  Take Zofran every 6 hours as needed.  Take Imodium as needed for two days.  No alcohol.  Follow up as needed.  Thank you for choosing me and [facility].   Tye Savoy, NP

## 2017-01-12 MED FILL — SERTRALINE HCL 100 MG TAB: 100 | 90 days supply | Qty: 90 | Fill #0

## 2017-01-18 NOTE — Progress Notes (Signed)
Reviewed and agree with documentation and assessment and plan. K. Veena Nandigam , MD   

## 2017-02-07 MED FILL — OXYCOD/ACETAMINOPHEN 5-325M: 5-325 | 15 days supply | Qty: 30 | Fill #0

## 2017-02-20 ENCOUNTER — Institutional Professional Consult (permissible substitution): Payer: BC Managed Care – PPO | Admitting: Neurology

## 2017-02-24 MED FILL — traMADol HCL 50 MG TABS: 50 | 30 days supply | Qty: 60 | Fill #0

## 2017-03-23 MED FILL — OXYCOD/ACETAMINOPHEN 5-325M: 5-325 | 15 days supply | Qty: 30 | Fill #0

## 2017-04-14 ENCOUNTER — Other Ambulatory Visit: Payer: Self-pay | Admitting: Cardiology

## 2017-04-17 ENCOUNTER — Other Ambulatory Visit (HOSPITAL_COMMUNITY): Payer: Self-pay | Admitting: Orthopedic Surgery

## 2017-04-17 DIAGNOSIS — M25562 Pain in left knee: Secondary | ICD-10-CM

## 2017-04-20 ENCOUNTER — Encounter (HOSPITAL_COMMUNITY)
Admission: RE | Admit: 2017-04-20 | Discharge: 2017-04-20 | Disposition: A | Discharge status: Home / Self Care | Payer: No Typology Code available for payment source | Source: Ambulatory Visit | Attending: Orthopedic Surgery | Admitting: Orthopedic Surgery

## 2017-04-20 DIAGNOSIS — M25562 Pain in left knee: Secondary | ICD-10-CM | POA: Diagnosis not present

## 2017-04-20 MED ORDER — TECHNETIUM TC 99M MEDRONATE IV KIT
25.0000 | PACK | Freq: Once | INTRAVENOUS | Status: AC | PRN
  Administered 2017-04-20: 25 via INTRAVENOUS

## 2017-04-20 MED FILL — CREON DR 36,000 UNITS CAP: 36000 | 30 days supply | Qty: 210 | Fill #3

## 2017-04-20 MED FILL — traMADol HCL 50 MG TABS: 50 | 30 days supply | Qty: 60 | Fill #0

## 2017-04-25 ENCOUNTER — Other Ambulatory Visit: Payer: Self-pay | Admitting: Orthopedic Surgery

## 2017-04-28 ENCOUNTER — Other Ambulatory Visit: Payer: Self-pay | Admitting: Orthopedic Surgery

## 2017-05-05 ENCOUNTER — Other Ambulatory Visit: Payer: Self-pay | Admitting: *Deleted

## 2017-05-05 NOTE — Patient Outreach (Addendum)
Received referral for pre op call for this [facility] plan member. 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 She had the first total knee arthroscopy surgery on 07/13/15.  Pre-op assessment completed including ensuring she has: completed her medical leave forms, will check her benefits to see if she elected to purchase the hospital indemnity benefit, has assistance at home when she is discharged, (she states she sill go to acute rehab before she is discharged home), and has the durable medical equipment she will need. Also discussed the [employer] with her as this RNCM is her Radio producer clinical team member as she is currently enrolled in the platform for diabetes self management assistance. There is no diabetes diagnosis listed in her electric medical records problem list but she states her primary care provider Dr. Ernie Hew, told her she has type II diabetes with an A1C of 6.7% on 01/12/17 . She verified that she is on no diabetes medications. According to her [employer] profile she onboarded on 03/31/17 but there is no data recorded in the [employer]. She says her iPhone 10 will not pair with the [facility] devices and that she has even tried entering her blood sugars manually in to the platform without success even with help from [employer] support. Advised her that she has another disease management program option through [employer] which would require her to engage with a RN health coach 2-3 times per year via phone. Encouraged her to think about these options and this RNCM will discuss again during the post op transition of care call that will be scheduled within 72 hours of her discharge from the hospital after her knee surgery on 05/12/17.   Barrington Ellison RN,CCM,CDE [facility] Coordinator Office Phone [phone] Office Fax  [phone]

## 2017-05-09 ENCOUNTER — Ambulatory Visit (HOSPITAL_COMMUNITY)
Admission: RE | Admit: 2017-05-09 | Discharge: 2017-05-09 | Disposition: A | Discharge status: Home / Self Care | Payer: No Typology Code available for payment source | Source: Ambulatory Visit | Attending: Orthopedic Surgery | Admitting: Orthopedic Surgery

## 2017-05-09 ENCOUNTER — Encounter (HOSPITAL_COMMUNITY)
Admission: RE | Admit: 2017-05-09 | Discharge: 2017-05-09 | Disposition: A | Discharge status: Home / Self Care | Payer: No Typology Code available for payment source | Source: Ambulatory Visit | Attending: Orthopedic Surgery | Admitting: Orthopedic Surgery

## 2017-05-09 ENCOUNTER — Other Ambulatory Visit: Payer: Self-pay

## 2017-05-09 ENCOUNTER — Encounter (HOSPITAL_COMMUNITY): Payer: Self-pay

## 2017-05-09 ENCOUNTER — Encounter (HOSPITAL_COMMUNITY): Payer: Self-pay | Admitting: *Deleted

## 2017-05-09 ENCOUNTER — Emergency Department (HOSPITAL_COMMUNITY)
Admission: EM | Admit: 2017-05-09 | Discharge: 2017-05-09 | Disposition: A | Discharge status: Home / Self Care | Payer: No Typology Code available for payment source | Attending: Emergency Medicine | Admitting: Emergency Medicine

## 2017-05-09 DIAGNOSIS — S161XXA Strain of muscle, fascia and tendon at neck level, initial encounter: Secondary | ICD-10-CM | POA: Insufficient documentation

## 2017-05-09 DIAGNOSIS — Y9389 Activity, other specified: Secondary | ICD-10-CM | POA: Insufficient documentation

## 2017-05-09 DIAGNOSIS — Y92410 Unspecified street and highway as the place of occurrence of the external cause: Secondary | ICD-10-CM | POA: Diagnosis not present

## 2017-05-09 DIAGNOSIS — Y999 Unspecified external cause status: Secondary | ICD-10-CM | POA: Insufficient documentation

## 2017-05-09 DIAGNOSIS — Z79899 Other long term (current) drug therapy: Secondary | ICD-10-CM | POA: Diagnosis not present

## 2017-05-09 DIAGNOSIS — S1989XA Other specified injuries of other specified part of neck, initial encounter: Secondary | ICD-10-CM | POA: Diagnosis present

## 2017-05-09 DIAGNOSIS — Z01818 Encounter for other preprocedural examination: Secondary | ICD-10-CM

## 2017-05-09 DIAGNOSIS — R109 Unspecified abdominal pain: Secondary | ICD-10-CM | POA: Diagnosis not present

## 2017-05-09 DIAGNOSIS — J45909 Unspecified asthma, uncomplicated: Secondary | ICD-10-CM | POA: Insufficient documentation

## 2017-05-09 DIAGNOSIS — Z96652 Presence of left artificial knee joint: Secondary | ICD-10-CM | POA: Insufficient documentation

## 2017-05-09 DIAGNOSIS — Z91040 Latex allergy status: Secondary | ICD-10-CM | POA: Diagnosis not present

## 2017-05-09 DIAGNOSIS — Z7982 Long term (current) use of aspirin: Secondary | ICD-10-CM | POA: Insufficient documentation

## 2017-05-09 DIAGNOSIS — I10 Essential (primary) hypertension: Secondary | ICD-10-CM | POA: Diagnosis not present

## 2017-05-09 HISTORY — DX: Prediabetes: R73.03

## 2017-05-09 HISTORY — DX: Angina pectoris with documented spasm: I20.1

## 2017-05-09 HISTORY — DX: Sleep apnea, unspecified: G47.30

## 2017-05-09 LAB — URINALYSIS, ROUTINE W REFLEX MICROSCOPIC
BACTERIA UA: NONE SEEN
BILIRUBIN URINE: NEGATIVE
Glucose, UA: NEGATIVE mg/dL
Ketones, ur: NEGATIVE mg/dL
LEUKOCYTES UA: NEGATIVE
NITRITE: NEGATIVE
Protein, ur: NEGATIVE mg/dL
SPECIFIC GRAVITY, URINE: 1.011 (ref 1.005–1.030)
Squamous Epithelial / LPF: NONE SEEN
pH: 7 (ref 5.0–8.0)

## 2017-05-09 LAB — CBC WITH DIFFERENTIAL/PLATELET
Basophils Absolute: 0 10*3/uL (ref 0.0–0.1)
Basophils Relative: 0 %
EOS ABS: 0.1 10*3/uL (ref 0.0–0.7)
Eosinophils Relative: 3 %
HCT: 37.7 % (ref 36.0–46.0)
HEMOGLOBIN: 12.2 g/dL (ref 12.0–15.0)
LYMPHS ABS: 2.1 10*3/uL (ref 0.7–4.0)
Lymphocytes Relative: 61 %
MCH: 27.1 pg (ref 26.0–34.0)
MCHC: 32.4 g/dL (ref 30.0–36.0)
MCV: 83.6 fL (ref 78.0–100.0)
MONO ABS: 0.2 10*3/uL (ref 0.1–1.0)
MONOS PCT: 5 %
NEUTROS PCT: 31 %
Neutro Abs: 1.1 10*3/uL — ABNORMAL LOW (ref 1.7–7.7)
Platelets: 167 10*3/uL (ref 150–400)
RBC: 4.51 MIL/uL (ref 3.87–5.11)
RDW: 13.2 % (ref 11.5–15.5)
WBC: 3.5 10*3/uL — ABNORMAL LOW (ref 4.0–10.5)

## 2017-05-09 LAB — TYPE AND SCREEN
ABO/RH(D): O POS
Antibody Screen: NEGATIVE

## 2017-05-09 LAB — BASIC METABOLIC PANEL
Anion gap: 9 (ref 5–15)
BUN: 7 mg/dL (ref 6–20)
CHLORIDE: 107 mmol/L (ref 101–111)
CO2: 24 mmol/L (ref 22–32)
CREATININE: 0.77 mg/dL (ref 0.44–1.00)
Calcium: 9.2 mg/dL (ref 8.9–10.3)
GFR calc Af Amer: >60 mL/min (ref >60)
GFR calc non Af Amer: >60 mL/min (ref >60)
GLUCOSE: 119 mg/dL — AB (ref 65–99)
Potassium: 3.6 mmol/L (ref 3.5–5.1)
Sodium: 140 mmol/L (ref 135–145)

## 2017-05-09 LAB — SURGICAL PCR SCREEN
MRSA, PCR: NEGATIVE
Staphylococcus aureus: NEGATIVE

## 2017-05-09 LAB — GLUCOSE, CAPILLARY: GLUCOSE-CAPILLARY: 113 mg/dL — AB (ref 65–99)

## 2017-05-09 LAB — PROTIME-INR
INR: 1.04
PROTHROMBIN TIME: 13.5 s (ref 11.4–15.2)

## 2017-05-09 LAB — APTT: aPTT: 28 seconds (ref 24–36)

## 2017-05-09 MED ORDER — ACETAMINOPHEN 500 MG PO TABS
1000.0000 mg | ORAL_TABLET | Freq: Once | ORAL | Status: AC
Start: 2017-05-09 — End: 2017-05-09
  Administered 2017-05-09: 1000 mg via ORAL
  Filled 2017-05-09: qty 2

## 2017-05-09 MED ORDER — CYCLOBENZAPRINE HCL 10 MG PO TABS: 10.0000 mg | ORAL_TABLET | Freq: Two times a day (BID) | ORAL | 0 refills | Status: DC | PRN
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
## 2017-05-09 NOTE — Discharge Instructions (Signed)
Take Tylenol over-the-counter medicines and use ice as needed for pain. For muscle spasm he can try Flexeril.  If you were given medicines take as directed.  If you are on coumadin or contraceptives realize their levels and effectiveness is altered by many different medicines.  If you have any reaction (rash, tongues swelling, other) to the medicines stop taking and see a physician.    If your blood pressure was elevated in the ER make sure you follow up for management with a primary doctor or return for chest pain, shortness of breath or stroke symptoms.  Please follow up as directed and return to the ER or see a physician for new or worsening symptoms.  Thank you. Vitals:   05/09/17 1528  BP: (!) 165/88  Pulse: (!) 113  Resp: 18  Temp: 98.6 F (37 C)  TempSrc: Oral  SpO2: 99%  Weight: 115.7 kg (255 lb)  Height: 5\' 9"  (1.753 m)
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
## 2017-05-09 NOTE — Progress Notes (Signed)
PCP - Dr. Rachell Cipro  Cardiologist - Denies  Chest x-ray - 05/09/17  EKG - 04/22/17 (E)  Stress Test - 09/30/10 (E)  ECHO - Denies  Cardiac Cath - 1990's- Negative, per pt  Sleep Study - Yes- Positive CPAP - No- Corrected with surgery, per pt  LABS- 05/09/17:CBC w/D, BMP, PT, PTT, UA, T/S   Anesthesia- Yes- Cardiac history  Pt denies having chest pain, sob, or fever at this time. All instructions explained to the pt, with a verbal understanding of the material. Pt agrees to go over the instructions while at home for a better understanding. The opportunity to ask questions was provided.

## 2017-05-09 NOTE — Pre-Procedure Instructions (Signed)
Leslie Martinez  05/09/2017      [place] [place] [address] [place] [address] Phone: [phone] Fax: [phone]    Your procedure is scheduled on Friday, May 12, 2017  Report to [facility] Admitting Entrance "A" at 5:30AM   Call this number if you have problems the morning of surgery:  [phone]   Remember:  Do not eat food or drink liquids after midnight.  Take these medicines the morning of surgery with A SIP OF WATER: Omeprazole (PRILOSEC) and Sertraline (ZOLOFT). If needed TraMADol Veatrice Bourbon) for pain, Cetirizine (ZYRTEC) for allergies, and Albuterol Inhaler for cough or wheezing (Bring with you the day of surgery).  Follow your doctor's instruction regarding Aspirin  As of today, stop taking all Aspirins, Vitamins, Fish oils, and Herbal medications. Also stop all NSAIDS i.e. Advil, Ibuprofen, Motrin, Aleve, Anaprox, Naproxen, BC and Goody Powders. Including: Melatonin   Do not wear jewelry, make-up or nail polish.  Do not wear lotions, powders, perfumes, or deodorant.  Do not shave 48 hours prior to surgery.    Do not bring valuables to the hospital.  [facility] is not responsible for any belongings or valuables.  Contacts, dentures or bridgework may not be worn into surgery.  Leave your suitcase in the car.  After surgery it may be brought to your room.  For patients admitted to the hospital, discharge time will be determined by your treatment team.  Patients discharged the day of surgery will not be allowed to drive home.   Special instructions:   [facility]- Preparing For Surgery  Before surgery, you can play an important role. Because skin is not sterile, your skin needs to be as free of germs as possible. You can reduce the number of germs on your skin by washing with CHG (chlorahexidine gluconate) Soap before surgery.  CHG is an antiseptic cleaner which kills germs and bonds  with the skin to continue killing germs even after washing.  Please do not use if you have an allergy to CHG or antibacterial soaps. If your skin becomes reddened/irritated stop using the CHG.  Do not shave (including legs and underarms) for at least 48 hours prior to first CHG shower. It is OK to shave your face.  Please follow these instructions carefully.   1. Shower the NIGHT BEFORE SURGERY and the MORNING OF SURGERY with CHG.   2. If you chose to wash your hair, wash your hair first as usual with your normal shampoo.  3. After you shampoo, rinse your hair and body thoroughly to remove the shampoo.  4. Use CHG as you would any other liquid soap. You can apply CHG directly to the skin and wash gently with a scrungie or a clean washcloth.   5. Apply the CHG Soap to your body ONLY FROM THE NECK DOWN.  Do not use on open wounds or open sores. Avoid contact with your eyes, ears, mouth and genitals (private parts). Wash Face and genitals (private parts)  with your normal soap.  6. Wash thoroughly, paying special attention to the area where your surgery will be performed.  7. Thoroughly rinse your body with warm water from the neck down.  8. DO NOT shower/wash with your normal soap after using and rinsing off the CHG Soap.  9. Pat yourself dry with a CLEAN TOWEL.  10. Wear CLEAN PAJAMAS to bed the night before surgery, wear comfortable  clothes the morning of surgery  11. Place CLEAN SHEETS on your bed the night of your first shower and DO NOT SLEEP WITH PETS.  Day of Surgery: Do not apply any deodorants/lotions. Please wear clean clothes to the hospital/surgery center.    Please read over the following fact sheets that you were given. Pain Booklet, Coughing and Deep Breathing, MRSA Information and Surgical Site Infection Prevention

## 2017-05-10 ENCOUNTER — Encounter (HOSPITAL_COMMUNITY): Payer: Self-pay

## 2017-05-10 NOTE — Progress Notes (Signed)
Anesthesia Chart Review:  Pt is a 52 year old female scheduled for L total knee revision on 05/12/2017 with Frederik Pear, MD  - PCP Rachell Cipro, MD  PMH includes:  Prinzmetal angina in [phone] (:"normal cath" in 1990's per notes by [facility] found in correspondence 07/17/15 in media tab), HTN, pre-diabetes, asthma, OSA, GERD. Never smoker. BMI 38. S/p L TKA 07/13/15. S/p nissen fundoplication 96/7/89.  Medications include: albuterol, ASA 81mg , creon, metoprolol, olmesartan-hctz, prilosec  BP (!) 166/89 Comment: pt states that she hasnt taking bp meds yet and she did not bring them with her  Pulse 63   Temp 36.7 C   Resp 20   Ht 5\' 9"  (1.753 m)   Wt 255 lb 8 oz (115.9 kg)   SpO2 95%   BMI 37.73 kg/m   Preoperative labs reviewed.    CXR 05/09/17: No active cardiopulmonary disease.  EKG will be obtained day of surgery.   Echo 06/09/14 ([facility]; found in correspondence 07/17/15 in media tab): 1.  Normal LV cavity size and wall thickness with a EF 65-70%. 2.  Trace pulmonic valve regurgitation. 3.  Trace mitral valve regurgitation. 4.  Analysis of mitral valve inflow, pulmonary vein Doppler and tissue Doppler suggests grade high diastolic dysfunction without elevated LA pressure  Stress test 09/30/10:  1.      Negative Lexiscan infusion.  2.      Mildly reduced LV function of 44%.  3.      No evidence of infarction or ischemia  If EKG acceptable day of surgery, I anticipate pt can proceed with surgery as scheduled.   Willeen Cass, FNP-BC [facility] Short Stay Surgical Center/Anesthesiology Phone: [phone] 05/10/2017 11:45 AM

## 2017-05-11 DIAGNOSIS — Z96659 Presence of unspecified artificial knee joint: Secondary | ICD-10-CM

## 2017-05-11 DIAGNOSIS — T84038A Mechanical loosening of other internal prosthetic joint, initial encounter: Secondary | ICD-10-CM

## 2017-05-11 MED ORDER — VANCOMYCIN HCL 10 G IV SOLR
1500.0000 mg | INTRAVENOUS | Status: AC
  Administered 2017-05-12: 1500 mg via INTRAVENOUS
  Filled 2017-05-11: qty 1500

## 2017-05-11 MED ORDER — TRANEXAMIC ACID 1000 MG/10ML IV SOLN
2000.0000 mg | INTRAVENOUS | Status: AC
  Administered 2017-05-12: 2000 mg via TOPICAL
  Filled 2017-05-11: qty 20

## 2017-05-11 NOTE — H&P (Signed)
TOTAL KNEE REVISION ADMISSION H&P  Patient is being admitted for left revision total knee arthroplasty.  Subjective:  Chief Complaint:left knee pain.  HPI: Leslie Martinez, 52 y.o. female, has a history of pain and functional disability in the left knee(s) due to trauma and failed previous arthroplasty and patient has failed non-surgical conservative treatments for greater than 12 weeks to include NSAID's and/or analgesics, use of assistive devices and activity modification. The indications for the revision of the total knee arthroplasty are loosening of one or more components. Onset of symptoms was abrupt starting 1 years ago with rapidlly worsening course since that time.  Prior procedures on the left knee(s) include arthroplasty.  Patient currently rates pain in the left knee(s) at 10 out of 10 with activity. There is night pain, worsening of pain with activity and weight bearing, pain that interferes with activities of daily living and pain with passive range of motion.  Patient has evidence of prosthetic loosening by imaging studies. This condition presents safety issues increasing the risk of falls.  There is no current active infection.  Patient Active Problem List   Diagnosis Date Noted  . Depression 07/23/2015  . Generalized anxiety disorder 07/23/2015  . Primary osteoarthritis of knee 07/13/2015  . Primary osteoarthritis of left knee 07/11/2015  . 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 Lap Nissen Nov 2013 02/06/2012  . Essential hypertension, benign 01/09/2008  . 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 Asthma, chronic 01/09/2008  . BARRETT'S ESOPHAGUS, HX OF 01/09/2008  . NEPHROLITHIASIS, HX OF 01/09/2008  . DYSPHAGIA UNSPECIFIED 11/26/2007   Past Medical History:  Diagnosis Date  . Allergy   . Arthritis    knees- OA  . Asthma    seasonal allergies, dry cough today   . 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 Depression   . Esophageal stricture   . Esophagitis   .  Gastritis   . GERD (gastroesophageal reflux disease)   . Hiatal hernia    history of, treated with surgery  . Hypertension   . Nephrolithiasis    passed stones spontaneously, also cystoscopy- done   . Pre-diabetes   . 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 Sleep apnea    Had nasal sx, and uses no cpap    Past Surgical History:  Procedure Laterality Date  . APPENDECTOMY    . CARDIAC CATHETERIZATION  1990's  . CARDIAC CATHETERIZATION    . COLONOSCOPY    . DILATION AND CURETTAGE OF UTERUS    . ENDOMETRIAL ABLATION    . JOINT REPLACEMENT    . KIDNEY POLYPS REMOVED    . KNEE ARTHROSCOPY Bilateral   . LAPAROSCOPIC NISSEN FUNDOPLICATION  51/11/8414   Procedure: LAPAROSCOPIC NISSEN FUNDOPLICATION;  Surgeon: Pedro Earls, MD;  Location: [facility];  Service: General;  Laterality: N/A;  . TONSILLECTOMY  2004   T&A- due to sleep apnea   . TOTAL KNEE ARTHROPLASTY Left 07/13/2015   Procedure: TOTAL KNEE ARTHROPLASTY;  Surgeon: Frederik Pear, MD;  Location: [facility];  Service: Orthopedics;  Laterality: Left;  . TUBAL LIGATION    . UPPER GASTROINTESTINAL ENDOSCOPY    . UPPER GI ENDOSCOPY  02/03/2012   Procedure: UPPER GI ENDOSCOPY;  Surgeon: Pedro Earls, MD;  Location: [facility];  Service: General;;  . VAGINAL HYSTERECTOMY    . WISDOM TOOTH EXTRACTION      Current Facility-Administered Medications  Medication Dose Route Frequency Provider Last Rate Last Dose  . 0.9 %  sodium chloride infusion  500 mL Intravenous Continuous Nandigam, Kavitha V, MD      . Derrill Memo ON 05/12/2017] tranexamic acid (CYKLOKAPRON) 2,000 mg in sodium chloride 0.9 % 50 mL Topical Application  9,379 mg Topical To Alferd Patee, MD      . Derrill Memo ON 05/12/2017] vancomycin (VANCOCIN) 1,500 mg in sodium chloride 0.9 % 500 mL IVPB  1,500 mg Intravenous To Alferd Patee, MD       Current Outpatient Medications  Medication Sig Dispense Refill Last Dose  . albuterol (PROVENTIL HFA;VENTOLIN HFA) 108 (90 BASE) MCG/ACT  inhaler Inhale 2 puffs into the lungs every 6 (six) hours as needed for wheezing or shortness of breath.   Taking  . aspirin EC 81 MG tablet Take 81 mg by mouth 3 (three) times a week.     . cetirizine (ZYRTEC) 10 MG tablet Take 10 mg by mouth daily as needed for allergies.   Taking  . lipase/protease/amylase (CREON) 36000 UNITS CPEP capsule Take 2 capsules with each meal and 1 capsule with a snack daily (Patient taking differently: Take 36,000-72,000 Units by mouth See admin instructions. Take 2 capsules with each meal and 1 capsule with a snack daily) 210 capsule 3 Taking  . Melatonin 5 MG TABS Take 5 mg by mouth at bedtime.     . metoprolol (TOPROL-XL) 100 MG 24 hr tablet Take 200 mg by mouth at bedtime.    Taking  . olmesartan-hydrochlorothiazide (BENICAR HCT) 40-25 MG tablet Take 1 tablet by mouth daily.   Taking  . omeprazole (PRILOSEC) 40 MG capsule Take 1 capsule (40 mg total) by mouth daily. 90 capsule 3 Taking  . oxyCODONE-acetaminophen (PERCOCET/ROXICET) 5-325 MG tablet Take 1 tablet by mouth 2 (two) times daily as needed (for pain.).     Marland Kitchen sertraline (ZOLOFT) 100 MG tablet Take 100 mg by mouth daily.     . traMADol (ULTRAM) 50 MG tablet Take 1 tablet (50 mg total) by mouth every 6 (six) hours as needed for moderate pain. Take 2 tablets (100 mg) by mouth every 6 hours as needed for severe pain. (Patient taking differently: Take 50 mg by mouth 2 (two) times daily as needed (for pain.). Take 2 tablets (100 mg) by mouth every 6 hours as needed for severe pain.) 90 tablet 0 Taking  . cyclobenzaprine (FLEXERIL) 10 MG tablet Take 1 tablet (10 mg total) by mouth 2 (two) times daily as needed for muscle spasms. 10 tablet 0    Allergies  Allergen Reactions  . Adhesive [Tape] Hives  . Iodine Hives and Rash  . Lisinopril Cough  . Cephalexin Hives and Rash  . Compazine [Prochlorperazine Edisylate]     Hyperactivity  . Latex Itching  . Oxycodone-Acetaminophen Itching and Rash    Social History    Tobacco Use  . Smoking status: Never Smoker  . Smokeless tobacco: Never Used  Substance Use Topics  . Alcohol use: No    Frequency: Never    Family History  Problem Relation Age of Onset  . Heart disease Mother   . Heart disease Father   . Diabetes Father   . Colon cancer Maternal Grandmother   . Colon polyps Neg Hx   . Esophageal cancer Neg Hx   . Rectal cancer Neg Hx   . Stomach cancer Neg Hx       Review of Systems  Constitutional: Negative.   HENT: Positive for sinus pain.   Eyes: Negative.  Poor vision  Cardiovascular: Positive for leg swelling.  Gastrointestinal: Positive for diarrhea, heartburn and nausea.  Genitourinary: Positive for hematuria.  Musculoskeletal: Positive for joint pain.  Skin: Negative.   Neurological: Positive for headaches.  Psychiatric/Behavioral: Positive for depression. The patient has insomnia.      Objective:  Physical Exam  Constitutional: She is oriented to person, place, and time. She appears well-developed and well-nourished.  HENT:  Head: Normocephalic and atraumatic.  Eyes: Pupils are equal, round, and reactive to light.  Neck: Normal range of motion. Neck supple.  Cardiovascular: Intact distal pulses.  Respiratory: Effort normal.  Musculoskeletal: She exhibits tenderness.  Tender along the proximal medial tibial tibia and joint line of left total knee surgical wound is well-healed, trace effusion, minimally tender laterally.    Neurological: She is alert and oriented to person, place, and time.  Skin: Skin is warm and dry.  Psychiatric: She has a normal mood and affect. Her behavior is normal. Judgment and thought content normal.    Vital signs in last 24 hours:    Labs:  Estimated body mass index is 37.66 kg/m as calculated from the following:   Height as of 05/09/17: 5\' 9"  (1.753 m).   Weight as of 05/09/17: 115.7 kg (255 lb).  Imaging Review Plain radiographs demonstrate  AP, lateral and sunrise x-rays show  subsidence of the tibia in relation to the tibial tubercle of about 5 mm when compared x-rays were done shortly after her surgery.  Assessment/Plan:  End stage arthritis, left knee(s) with failed previous arthroplasty.   The patient history, physical examination, clinical judgment of the provider and imaging studies are consistent with end stage degenerative joint disease of the left knee(s), previous total knee arthroplasty. Revision total knee arthroplasty is deemed medically necessary. The treatment options including medical management, injection therapy, arthroscopy and revision arthroplasty were discussed at length. The risks and benefits of revision total knee arthroplasty were presented and reviewed. The risks due to aseptic loosening, infection, stiffness, patella tracking problems, thromboembolic complications and other imponderables were discussed. The patient acknowledged the explanation, agreed to proceed with the plan and consent was signed. Patient is being admitted for inpatient treatment for surgery, pain control, PT, OT, prophylactic antibiotics, VTE prophylaxis, progressive ambulation and ADL's and discharge planning.The patient is planning to be discharged home with home health services.

## 2017-05-12 ENCOUNTER — Other Ambulatory Visit: Payer: Self-pay

## 2017-05-12 ENCOUNTER — Inpatient Hospital Stay (HOSPITAL_COMMUNITY): Payer: No Typology Code available for payment source | Admitting: Emergency Medicine

## 2017-05-12 ENCOUNTER — Inpatient Hospital Stay (HOSPITAL_COMMUNITY)
Admission: RE | Admit: 2017-05-12 | Discharge: 2017-05-16 | DRG: 467 | Disposition: A | Discharge status: Skilled Nursing Facility | Payer: No Typology Code available for payment source | Source: Ambulatory Visit | Attending: Orthopedic Surgery | Admitting: Orthopedic Surgery

## 2017-05-12 ENCOUNTER — Inpatient Hospital Stay (HOSPITAL_COMMUNITY): Payer: No Typology Code available for payment source | Admitting: Anesthesiology

## 2017-05-12 ENCOUNTER — Encounter (HOSPITAL_COMMUNITY): Payer: Self-pay | Admitting: Anesthesiology

## 2017-05-12 ENCOUNTER — Encounter (HOSPITAL_COMMUNITY)
Admission: RE | Disposition: A | Discharge status: Skilled Nursing Facility | Payer: Self-pay | Source: Ambulatory Visit | Attending: Orthopedic Surgery

## 2017-05-12 ENCOUNTER — Inpatient Hospital Stay (HOSPITAL_COMMUNITY): Payer: No Typology Code available for payment source

## 2017-05-12 DIAGNOSIS — F411 Generalized anxiety disorder: Secondary | ICD-10-CM | POA: Diagnosis present

## 2017-05-12 DIAGNOSIS — Z96659 Presence of unspecified artificial knee joint: Secondary | ICD-10-CM

## 2017-05-12 DIAGNOSIS — Z885 Allergy status to narcotic agent status: Secondary | ICD-10-CM | POA: Diagnosis not present

## 2017-05-12 DIAGNOSIS — Z91040 Latex allergy status: Secondary | ICD-10-CM | POA: Diagnosis not present

## 2017-05-12 DIAGNOSIS — Z881 Allergy status to other antibiotic agents status: Secondary | ICD-10-CM | POA: Diagnosis not present

## 2017-05-12 DIAGNOSIS — T84038A Mechanical loosening of other internal prosthetic joint, initial encounter: Secondary | ICD-10-CM | POA: Diagnosis not present

## 2017-05-12 DIAGNOSIS — Z8249 Family history of ischemic heart disease and other diseases of the circulatory system: Secondary | ICD-10-CM | POA: Diagnosis not present

## 2017-05-12 DIAGNOSIS — R339 Retention of urine, unspecified: Secondary | ICD-10-CM | POA: Diagnosis not present

## 2017-05-12 DIAGNOSIS — F329 Major depressive disorder, single episode, unspecified: Secondary | ICD-10-CM | POA: Diagnosis present

## 2017-05-12 DIAGNOSIS — I10 Essential (primary) hypertension: Secondary | ICD-10-CM

## 2017-05-12 DIAGNOSIS — Z888 Allergy status to other drugs, medicaments and biological substances status: Secondary | ICD-10-CM

## 2017-05-12 DIAGNOSIS — J302 Other seasonal allergic rhinitis: Secondary | ICD-10-CM | POA: Diagnosis present

## 2017-05-12 DIAGNOSIS — Z90710 Acquired absence of both cervix and uterus: Secondary | ICD-10-CM | POA: Diagnosis not present

## 2017-05-12 DIAGNOSIS — Z833 Family history of diabetes mellitus: Secondary | ICD-10-CM

## 2017-05-12 DIAGNOSIS — T84033A Mechanical loosening of internal left knee prosthetic joint, initial encounter: Principal | ICD-10-CM | POA: Diagnosis present

## 2017-05-12 DIAGNOSIS — E119 Type 2 diabetes mellitus without complications: Secondary | ICD-10-CM | POA: Diagnosis present

## 2017-05-12 DIAGNOSIS — K219 Gastro-esophageal reflux disease without esophagitis: Secondary | ICD-10-CM | POA: Diagnosis present

## 2017-05-12 DIAGNOSIS — M170 Bilateral primary osteoarthritis of knee: Secondary | ICD-10-CM | POA: Diagnosis present

## 2017-05-12 DIAGNOSIS — E669 Obesity, unspecified: Secondary | ICD-10-CM | POA: Diagnosis present

## 2017-05-12 DIAGNOSIS — Z91048 Other nonmedicinal substance allergy status: Secondary | ICD-10-CM

## 2017-05-12 DIAGNOSIS — Z79899 Other long term (current) drug therapy: Secondary | ICD-10-CM | POA: Diagnosis not present

## 2017-05-12 DIAGNOSIS — G4700 Insomnia, unspecified: Secondary | ICD-10-CM | POA: Diagnosis present

## 2017-05-12 DIAGNOSIS — Y792 Prosthetic and other implants, materials and accessory orthopedic devices associated with adverse incidents: Secondary | ICD-10-CM | POA: Diagnosis present

## 2017-05-12 DIAGNOSIS — Z79891 Long term (current) use of opiate analgesic: Secondary | ICD-10-CM | POA: Diagnosis not present

## 2017-05-12 DIAGNOSIS — Z87442 Personal history of urinary calculi: Secondary | ICD-10-CM

## 2017-05-12 DIAGNOSIS — Z7982 Long term (current) use of aspirin: Secondary | ICD-10-CM | POA: Diagnosis not present

## 2017-05-12 DIAGNOSIS — N179 Acute kidney failure, unspecified: Secondary | ICD-10-CM | POA: Diagnosis not present

## 2017-05-12 DIAGNOSIS — M25562 Pain in left knee: Secondary | ICD-10-CM | POA: Diagnosis present

## 2017-05-12 DIAGNOSIS — G4730 Sleep apnea, unspecified: Secondary | ICD-10-CM | POA: Diagnosis present

## 2017-05-12 DIAGNOSIS — Z96652 Presence of left artificial knee joint: Secondary | ICD-10-CM

## 2017-05-12 DIAGNOSIS — M1712 Unilateral primary osteoarthritis, left knee: Secondary | ICD-10-CM | POA: Diagnosis present

## 2017-05-12 DIAGNOSIS — Z6837 Body mass index (BMI) 37.0-37.9, adult: Secondary | ICD-10-CM

## 2017-05-12 HISTORY — DX: Personal history of urinary calculi: Z87.442

## 2017-05-12 HISTORY — PX: TOTAL KNEE REVISION: SHX996

## 2017-05-12 HISTORY — DX: Pneumonia, unspecified organism: J18.9

## 2017-05-12 HISTORY — PX: REVISION TOTAL KNEE ARTHROPLASTY: SHX767

## 2017-05-12 HISTORY — DX: Anxiety disorder, unspecified: F41.9

## 2017-05-12 HISTORY — DX: Malignant neoplasm of cervix uteri, unspecified: C53.9

## 2017-05-12 HISTORY — DX: Bilateral primary osteoarthritis of knee: M17.0

## 2017-05-12 HISTORY — DX: Unspecified chronic bronchitis: J42

## 2017-05-12 HISTORY — DX: Personal history of other medical treatment: Z92.89

## 2017-05-12 HISTORY — DX: Acute pancreatitis without necrosis or infection, unspecified: K85.90

## 2017-05-12 HISTORY — DX: Migraine, unspecified, not intractable, without status migrainosus: G43.909

## 2017-05-12 HISTORY — DX: Other seasonal allergic rhinitis: J30.2

## 2017-05-12 SURGERY — TOTAL KNEE REVISION
Anesthesia: Monitor Anesthesia Care | Site: Knee | Laterality: Left

## 2017-05-12 MED ORDER — TRANEXAMIC ACID 1000 MG/10ML IV SOLN
1000.0000 mg | INTRAVENOUS | Status: AC
  Administered 2017-05-12: 1000 mg via INTRAVENOUS
  Filled 2017-05-12: qty 1100

## 2017-05-12 MED ORDER — METHOCARBAMOL 1000 MG/10ML IJ SOLN: 500.0000 mg | Freq: Four times a day (QID) | INTRAMUSCULAR | Status: DC | PRN

## 2017-05-12 MED ORDER — DOCUSATE SODIUM 100 MG PO CAPS
100.0000 mg | ORAL_CAPSULE | Freq: Two times a day (BID) | ORAL | Status: DC
  Administered 2017-05-12 – 2017-05-16 (×8): 100 mg via ORAL
  Filled 2017-05-12 (×9): qty 1

## 2017-05-12 MED ORDER — SENNOSIDES-DOCUSATE SODIUM 8.6-50 MG PO TABS: 1.0000 | ORAL_TABLET | Freq: Every evening | ORAL | Status: DC | PRN

## 2017-05-12 MED ORDER — FENTANYL CITRATE (PF) 250 MCG/5ML IJ SOLN
INTRAMUSCULAR | Status: AC
  Filled 2017-05-12: qty 5

## 2017-05-12 MED ORDER — OXYCODONE-ACETAMINOPHEN 5-325 MG PO TABS: 1.0000 | ORAL_TABLET | ORAL | 0 refills | Status: DC | PRN

## 2017-05-12 MED ORDER — MIDAZOLAM HCL 5 MG/5ML IJ SOLN
INTRAMUSCULAR | Status: DC | PRN
  Administered 2017-05-12: 2 mg via INTRAVENOUS

## 2017-05-12 MED ORDER — TIZANIDINE HCL 2 MG PO TABS: 2.0000 mg | ORAL_TABLET | Freq: Four times a day (QID) | ORAL | 0 refills | Status: DC | PRN

## 2017-05-12 MED ORDER — ALUM & MAG HYDROXIDE-SIMETH 200-200-20 MG/5ML PO SUSP: 30.0000 mL | ORAL | Status: DC | PRN

## 2017-05-12 MED ORDER — MENTHOL 3 MG MT LOZG: 1.0000 | LOZENGE | OROMUCOSAL | Status: DC | PRN

## 2017-05-12 MED ORDER — METOPROLOL SUCCINATE ER 100 MG PO TB24
200.0000 mg | ORAL_TABLET | Freq: Every day | ORAL | Status: DC
  Administered 2017-05-12: 200 mg via ORAL
  Filled 2017-05-12: qty 2

## 2017-05-12 MED ORDER — METOCLOPRAMIDE HCL 5 MG PO TABS
5.0000 mg | ORAL_TABLET | Freq: Three times a day (TID) | ORAL | Status: DC | PRN
  Filled 2017-05-12: qty 2

## 2017-05-12 MED ORDER — PANCRELIPASE (LIP-PROT-AMYL) 36000-114000 UNITS PO CPEP
72000.0000 [IU] | ORAL_CAPSULE | Freq: Three times a day (TID) | ORAL | Status: DC
  Administered 2017-05-12 – 2017-05-16 (×12): 72000 [IU] via ORAL
  Filled 2017-05-12 (×14): qty 2

## 2017-05-12 MED ORDER — SODIUM CHLORIDE 0.9 % IJ SOLN
INTRAMUSCULAR | Status: DC | PRN
  Administered 2017-05-12: 50 mL

## 2017-05-12 MED ORDER — LACTATED RINGERS IV SOLN
INTRAVENOUS | Status: DC
  Administered 2017-05-12: 07:00:00 via INTRAVENOUS

## 2017-05-12 MED ORDER — LORATADINE 10 MG PO TABS
10.0000 mg | ORAL_TABLET | Freq: Every day | ORAL | Status: DC
Start: 2017-05-12 — End: 2017-05-16
  Administered 2017-05-12 – 2017-05-16 (×5): 10 mg via ORAL
  Filled 2017-05-12 (×5): qty 1

## 2017-05-12 MED ORDER — BUPIVACAINE LIPOSOME 1.3 % IJ SUSP
20.0000 mL | Freq: Once | INTRAMUSCULAR | Status: DC
  Filled 2017-05-12: qty 20

## 2017-05-12 MED ORDER — FENTANYL CITRATE (PF) 250 MCG/5ML IJ SOLN
INTRAMUSCULAR | Status: DC | PRN
  Administered 2017-05-12: 50 ug via INTRAVENOUS

## 2017-05-12 MED ORDER — SODIUM CHLORIDE 0.9 % IR SOLN
Status: DC | PRN
  Administered 2017-05-12: 1000 mL
  Administered 2017-05-12: 3000 mL

## 2017-05-12 MED ORDER — ONDANSETRON HCL 4 MG PO TABS: 4.0000 mg | ORAL_TABLET | Freq: Four times a day (QID) | ORAL | Status: DC | PRN

## 2017-05-12 MED ORDER — MIDAZOLAM HCL 2 MG/2ML IJ SOLN
INTRAMUSCULAR | Status: AC
  Filled 2017-05-12: qty 2

## 2017-05-12 MED ORDER — CELECOXIB 200 MG PO CAPS
200.0000 mg | ORAL_CAPSULE | Freq: Two times a day (BID) | ORAL | Status: DC
  Administered 2017-05-12 – 2017-05-13 (×3): 200 mg via ORAL
  Filled 2017-05-12 (×3): qty 1

## 2017-05-12 MED ORDER — METHOCARBAMOL 500 MG PO TABS
500.0000 mg | ORAL_TABLET | Freq: Four times a day (QID) | ORAL | Status: DC | PRN
  Administered 2017-05-12 – 2017-05-16 (×12): 500 mg via ORAL
  Filled 2017-05-12 (×12): qty 1

## 2017-05-12 MED ORDER — METOCLOPRAMIDE HCL 5 MG/ML IJ SOLN: 5.0000 mg | Freq: Three times a day (TID) | INTRAMUSCULAR | Status: DC | PRN

## 2017-05-12 MED ORDER — SERTRALINE HCL 100 MG PO TABS
100.0000 mg | ORAL_TABLET | Freq: Every day | ORAL | Status: DC
  Administered 2017-05-12 – 2017-05-16 (×5): 100 mg via ORAL
  Filled 2017-05-12 (×6): qty 1

## 2017-05-12 MED ORDER — KCL IN DEXTROSE-NACL 20-5-0.45 MEQ/L-%-% IV SOLN
INTRAVENOUS | Status: DC
  Administered 2017-05-12 – 2017-05-13 (×4): via INTRAVENOUS
  Filled 2017-05-12 (×3): qty 1000

## 2017-05-12 MED ORDER — PHENOL 1.4 % MT LIQD: 1.0000 | OROMUCOSAL | Status: DC | PRN

## 2017-05-12 MED ORDER — FLEET ENEMA 7-19 GM/118ML RE ENEM: 1.0000 | ENEMA | Freq: Once | RECTAL | Status: DC | PRN

## 2017-05-12 MED ORDER — CEFUROXIME SODIUM 1.5 G IV SOLR
INTRAVENOUS | Status: AC
  Filled 2017-05-12: qty 1.5

## 2017-05-12 MED ORDER — BISACODYL 5 MG PO TBEC
5.0000 mg | DELAYED_RELEASE_TABLET | Freq: Every day | ORAL | Status: DC | PRN
  Administered 2017-05-14: 5 mg via ORAL
  Filled 2017-05-12: qty 1

## 2017-05-12 MED ORDER — HYDROMORPHONE HCL 1 MG/ML IJ SOLN
0.5000 mg | INTRAMUSCULAR | Status: DC | PRN
  Administered 2017-05-12 – 2017-05-15 (×8): 0.5 mg via INTRAVENOUS
  Filled 2017-05-12 (×8): qty 1

## 2017-05-12 MED ORDER — HYDROCHLOROTHIAZIDE 25 MG PO TABS
25.0000 mg | ORAL_TABLET | Freq: Every day | ORAL | Status: DC
  Administered 2017-05-12 – 2017-05-13 (×2): 25 mg via ORAL
  Filled 2017-05-12 (×2): qty 1

## 2017-05-12 MED ORDER — ROPIVACAINE HCL 5 MG/ML IJ SOLN
INTRAMUSCULAR | Status: DC | PRN
  Administered 2017-05-12: 20 mL via PERINEURAL

## 2017-05-12 MED ORDER — HYDROMORPHONE HCL 1 MG/ML IJ SOLN
INTRAMUSCULAR | Status: AC
  Administered 2017-05-12: 0.5 mg via INTRAVENOUS
  Filled 2017-05-12: qty 1

## 2017-05-12 MED ORDER — ASPIRIN EC 325 MG PO TBEC: 325.0000 mg | DELAYED_RELEASE_TABLET | Freq: Two times a day (BID) | ORAL | 0 refills | Status: DC

## 2017-05-12 MED ORDER — DEXAMETHASONE SODIUM PHOSPHATE 10 MG/ML IJ SOLN
10.0000 mg | Freq: Once | INTRAMUSCULAR | Status: AC
  Administered 2017-05-13: 10 mg via INTRAVENOUS
  Filled 2017-05-12 (×2): qty 1

## 2017-05-12 MED ORDER — OXYCODONE HCL 5 MG PO TABS
ORAL_TABLET | ORAL | Status: AC
  Administered 2017-05-12: 10 mg via ORAL
  Filled 2017-05-12: qty 1

## 2017-05-12 MED ORDER — BUPIVACAINE LIPOSOME 1.3 % IJ SUSP
INTRAMUSCULAR | Status: DC | PRN
  Administered 2017-05-12: 20 mL

## 2017-05-12 MED ORDER — IRBESARTAN 300 MG PO TABS
300.0000 mg | ORAL_TABLET | Freq: Every day | ORAL | Status: DC
  Administered 2017-05-12 – 2017-05-13 (×2): 300 mg via ORAL
  Filled 2017-05-12 (×2): qty 1

## 2017-05-12 MED ORDER — ASPIRIN EC 325 MG PO TBEC
325.0000 mg | DELAYED_RELEASE_TABLET | Freq: Every day | ORAL | Status: DC
  Administered 2017-05-13 – 2017-05-16 (×4): 325 mg via ORAL
  Filled 2017-05-12 (×5): qty 1

## 2017-05-12 MED ORDER — HYDROMORPHONE HCL 1 MG/ML IJ SOLN
0.2500 mg | INTRAMUSCULAR | Status: DC | PRN
  Administered 2017-05-12 (×2): 0.5 mg via INTRAVENOUS

## 2017-05-12 MED ORDER — METHOCARBAMOL 500 MG PO TABS
ORAL_TABLET | ORAL | Status: AC
  Administered 2017-05-12: 16:00:00
  Filled 2017-05-12: qty 1

## 2017-05-12 MED ORDER — CHLORHEXIDINE GLUCONATE 4 % EX LIQD: 60.0000 mL | Freq: Once | CUTANEOUS | Status: DC

## 2017-05-12 MED ORDER — ONDANSETRON HCL 4 MG/2ML IJ SOLN
INTRAMUSCULAR | Status: DC | PRN
  Administered 2017-05-12: 4 mg via INTRAVENOUS

## 2017-05-12 MED ORDER — BUPIVACAINE IN DEXTROSE 0.75-8.25 % IT SOLN
INTRATHECAL | Status: DC | PRN
  Administered 2017-05-12: 2 mL via INTRATHECAL

## 2017-05-12 MED ORDER — MELATONIN 3 MG PO TABS
4.5000 mg | ORAL_TABLET | Freq: Every day | ORAL | Status: DC
  Administered 2017-05-13 – 2017-05-15 (×3): 4.5 mg via ORAL
  Filled 2017-05-12 (×5): qty 1.5

## 2017-05-12 MED ORDER — PROPOFOL 10 MG/ML IV BOLUS
INTRAVENOUS | Status: DC | PRN
  Administered 2017-05-12: 20 ug via INTRAVENOUS
  Administered 2017-05-12: 50 ug via INTRAVENOUS

## 2017-05-12 MED ORDER — PANCRELIPASE (LIP-PROT-AMYL) 36000-114000 UNITS PO CPEP: 36000.0000 [IU] | ORAL_CAPSULE | Freq: Every day | ORAL | Status: DC | PRN

## 2017-05-12 MED ORDER — BUPIVACAINE-EPINEPHRINE 0.25% -1:200000 IJ SOLN
INTRAMUSCULAR | Status: DC | PRN
  Administered 2017-05-12: 50 mL

## 2017-05-12 MED ORDER — PANTOPRAZOLE SODIUM 40 MG PO TBEC
80.0000 mg | DELAYED_RELEASE_TABLET | Freq: Every day | ORAL | Status: DC
  Administered 2017-05-12 – 2017-05-16 (×5): 80 mg via ORAL
  Filled 2017-05-12 (×5): qty 2

## 2017-05-12 MED ORDER — PROPOFOL 500 MG/50ML IV EMUL
INTRAVENOUS | Status: DC | PRN
  Administered 2017-05-12: 50 ug/kg/min via INTRAVENOUS

## 2017-05-12 MED ORDER — ALBUTEROL SULFATE (2.5 MG/3ML) 0.083% IN NEBU: 3.0000 mL | INHALATION_SOLUTION | Freq: Four times a day (QID) | RESPIRATORY_TRACT | Status: DC | PRN

## 2017-05-12 MED ORDER — ACETAMINOPHEN 650 MG RE SUPP: 650.0000 mg | RECTAL | Status: DC | PRN

## 2017-05-12 MED ORDER — DIPHENHYDRAMINE HCL 12.5 MG/5ML PO ELIX
12.5000 mg | ORAL_SOLUTION | ORAL | Status: DC | PRN
  Administered 2017-05-12: 12.5 mg via ORAL
  Filled 2017-05-12: qty 10

## 2017-05-12 MED ORDER — ACETAMINOPHEN 325 MG PO TABS: 650.0000 mg | ORAL_TABLET | ORAL | Status: DC | PRN

## 2017-05-12 MED ORDER — BUPIVACAINE-EPINEPHRINE 0.25% -1:200000 IJ SOLN
INTRAMUSCULAR | Status: AC
  Filled 2017-05-12: qty 1

## 2017-05-12 MED ORDER — OLMESARTAN MEDOXOMIL-HCTZ 40-25 MG PO TABS: 1.0000 | ORAL_TABLET | Freq: Every day | ORAL | Status: DC

## 2017-05-12 MED ORDER — ONDANSETRON HCL 4 MG/2ML IJ SOLN
4.0000 mg | Freq: Four times a day (QID) | INTRAMUSCULAR | Status: DC | PRN
  Administered 2017-05-12 – 2017-05-13 (×2): 4 mg via INTRAVENOUS
  Filled 2017-05-12 (×2): qty 2

## 2017-05-12 MED ORDER — OXYCODONE HCL 5 MG PO TABS
10.0000 mg | ORAL_TABLET | ORAL | Status: DC | PRN
  Administered 2017-05-12 – 2017-05-16 (×16): 10 mg via ORAL
  Filled 2017-05-12 (×16): qty 2

## 2017-05-12 MED ORDER — GABAPENTIN 300 MG PO CAPS
300.0000 mg | ORAL_CAPSULE | Freq: Three times a day (TID) | ORAL | Status: DC
  Administered 2017-05-12 – 2017-05-16 (×13): 300 mg via ORAL
  Filled 2017-05-12 (×13): qty 1

## 2017-05-12 MED ORDER — OXYCODONE HCL 5 MG PO TABS
5.0000 mg | ORAL_TABLET | ORAL | Status: DC | PRN
  Administered 2017-05-12 – 2017-05-15 (×5): 5 mg via ORAL
  Filled 2017-05-12 (×4): qty 1

## 2017-05-12 MED ORDER — PHENYLEPHRINE HCL 10 MG/ML IJ SOLN
INTRAVENOUS | Status: DC | PRN
  Administered 2017-05-12: 25 ug/min via INTRAVENOUS

## 2017-05-12 MED ORDER — TRANEXAMIC ACID 1000 MG/10ML IV SOLN
1000.0000 mg | Freq: Once | INTRAVENOUS | Status: AC
  Administered 2017-05-12: 1000 mg via INTRAVENOUS
  Filled 2017-05-12 (×2): qty 10

## 2017-05-12 SURGICAL SUPPLY — 78 items
ATTUNE PSRP INSR SZ6 12 KNEE (Insert) ×2 IMPLANT
BAG DECANTER FOR FLEXI CONT (MISCELLANEOUS) ×2 IMPLANT
BANDAGE ACE 4X5 VEL STRL LF (GAUZE/BANDAGES/DRESSINGS) ×2 IMPLANT
BANDAGE ACE 6X5 VEL STRL LF (GAUZE/BANDAGES/DRESSINGS) ×2 IMPLANT
BANDAGE ESMARK 6X9 LF (GAUZE/BANDAGES/DRESSINGS) ×1 IMPLANT
BASE TIBIAL ATTUNE SZ7 RP REV (Joint) ×1 IMPLANT
BLADE SAG 18X100X1.27 (BLADE) ×2 IMPLANT
BLADE SAW SAG 90X13X1.27 (BLADE) ×2 IMPLANT
BNDG ELASTIC 6X10 VLCR STRL LF (GAUZE/BANDAGES/DRESSINGS) ×2 IMPLANT
BNDG ESMARK 6X9 LF (GAUZE/BANDAGES/DRESSINGS) ×2
BOWL SMART MIX CTS (DISPOSABLE) ×2 IMPLANT
CEMENT HV SMART SET (Cement) ×8 IMPLANT
CHLORAPREP W/TINT 26ML (MISCELLANEOUS) ×2 IMPLANT
CONT SPEC 4OZ CLIKSEAL STRL BL (MISCELLANEOUS) ×2 IMPLANT
COVER BACK TABLE 24X17X13 BIG (DRAPES) IMPLANT
COVER SURGICAL LIGHT HANDLE (MISCELLANEOUS) ×2 IMPLANT
CUFF TOURNIQUET SINGLE 34IN LL (TOURNIQUET CUFF) ×2 IMPLANT
CUFF TOURNIQUET SINGLE 44IN (TOURNIQUET CUFF) IMPLANT
DECANTER SPIKE VIAL GLASS SM (MISCELLANEOUS) ×2 IMPLANT
DISC DIAMOND MED (BURR) IMPLANT
DRAPE EXTREMITY T 121X128X90 (DRAPE) ×2 IMPLANT
DRAPE HALF SHEET 40X57 (DRAPES) ×2 IMPLANT
DRAPE IMP U-DRAPE 54X76 (DRAPES) ×2 IMPLANT
DRAPE INCISE IOBAN 66X45 STRL (DRAPES) ×2 IMPLANT
DRAPE U-SHAPE 47X51 STRL (DRAPES) ×2 IMPLANT
DRSG AQUACEL AG ADV 3.5X10 (GAUZE/BANDAGES/DRESSINGS) ×2 IMPLANT
ELECT REM PT RETURN 9FT ADLT (ELECTROSURGICAL) ×2
ELECTRODE REM PT RTRN 9FT ADLT (ELECTROSURGICAL) ×1 IMPLANT
EVACUATOR 1/8 PVC DRAIN (DRAIN) IMPLANT
GAUZE SPONGE 4X4 12PLY STRL (GAUZE/BANDAGES/DRESSINGS) ×4 IMPLANT
GAUZE XEROFORM 1X8 LF (GAUZE/BANDAGES/DRESSINGS) ×2 IMPLANT
GLOVE BIO SURGEON STRL SZ7.5 (GLOVE) ×2 IMPLANT
GLOVE BIO SURGEON STRL SZ8.5 (GLOVE) ×4 IMPLANT
GLOVE BIOGEL PI IND STRL 8 (GLOVE) ×2 IMPLANT
GLOVE BIOGEL PI IND STRL 9 (GLOVE) ×1 IMPLANT
GLOVE BIOGEL PI INDICATOR 8 (GLOVE) ×2
GLOVE BIOGEL PI INDICATOR 9 (GLOVE) ×1
GLOVE SURG SS PI 7.0 STRL IVOR (GLOVE) ×2 IMPLANT
GOWN STRL REUS W/ TWL LRG LVL3 (GOWN DISPOSABLE) ×2 IMPLANT
GOWN STRL REUS W/ TWL XL LVL3 (GOWN DISPOSABLE) ×3 IMPLANT
GOWN STRL REUS W/TWL LRG LVL3 (GOWN DISPOSABLE) ×2
GOWN STRL REUS W/TWL XL LVL3 (GOWN DISPOSABLE) ×3
HANDPIECE INTERPULSE COAX TIP (DISPOSABLE) ×1
HOOD PEEL AWAY FACE SHEILD DIS (HOOD) ×8 IMPLANT
IMMOBILIZER KNEE 22 UNIV (SOFTGOODS) ×2 IMPLANT
KIT BASIN OR (CUSTOM PROCEDURE TRAY) ×2 IMPLANT
KIT ROOM TURNOVER OR (KITS) ×2 IMPLANT
MANIFOLD NEPTUNE II (INSTRUMENTS) ×2 IMPLANT
MARKER SKIN DUAL TIP RULER LAB (MISCELLANEOUS) ×2 IMPLANT
NEEDLE 22X1 1/2 (OR ONLY) (NEEDLE) ×4 IMPLANT
NEEDLE SPNL 18GX3.5 QUINCKE PK (NEEDLE) ×2 IMPLANT
NS IRRIG 1000ML POUR BTL (IV SOLUTION) ×2 IMPLANT
PACK TOTAL JOINT (CUSTOM PROCEDURE TRAY) ×2 IMPLANT
PACK UNIVERSAL I (CUSTOM PROCEDURE TRAY) ×2 IMPLANT
PAD ARMBOARD 7.5X6 YLW CONV (MISCELLANEOUS) ×4 IMPLANT
PAD CAST 4YDX4 CTTN HI CHSV (CAST SUPPLIES) ×1 IMPLANT
PADDING CAST COTTON 4X4 STRL (CAST SUPPLIES) ×1
PADDING CAST COTTON 6X4 STRL (CAST SUPPLIES) ×2 IMPLANT
RASP HELIOCORDIAL MED (MISCELLANEOUS) IMPLANT
SET HNDPC FAN SPRY TIP SCT (DISPOSABLE) ×1 IMPLANT
SPONGE LAP 18X18 X RAY DECT (DISPOSABLE) ×2 IMPLANT
STAPLER VISISTAT 35W (STAPLE) ×2 IMPLANT
STEM KNEE ATTUNE 12X160 REV (Joint) ×2 IMPLANT
SUCTION FRAZIER HANDLE 10FR (MISCELLANEOUS) ×1
SUCTION TUBE FRAZIER 10FR DISP (MISCELLANEOUS) ×1 IMPLANT
SUT VIC AB 0 CT1 27 (SUTURE) ×1
SUT VIC AB 0 CT1 27XBRD ANBCTR (SUTURE) ×1 IMPLANT
SUT VIC AB 1 CTX 36 (SUTURE) ×1
SUT VIC AB 1 CTX36XBRD ANBCTR (SUTURE) ×1 IMPLANT
SUT VIC AB 2-0 CT1 27 (SUTURE) ×1
SUT VIC AB 2-0 CT1 TAPERPNT 27 (SUTURE) ×1 IMPLANT
SWAB COLLECTION DEVICE MRSA (MISCELLANEOUS) ×2 IMPLANT
SWAB CULTURE ESWAB REG 1ML (MISCELLANEOUS) ×2 IMPLANT
SYR 50ML LL SCALE MARK (SYRINGE) ×2 IMPLANT
SYR CONTROL 10ML LL (SYRINGE) ×4 IMPLANT
TIBIAL BASE ATTUNE SZ7 RP REV (Joint) ×2 IMPLANT
TOWEL OR 17X26 10 PK STRL BLUE (TOWEL DISPOSABLE) ×2 IMPLANT
TRAY FOLEY CATH SILVER 14FR (SET/KITS/TRAYS/PACK) IMPLANT

## 2017-05-12 NOTE — Anesthesia Procedure Notes (Signed)
Procedure Name: MAC Date/Time: 05/12/2017 7:55 AM Performed by: Lieutenant Diego, CRNA Pre-anesthesia Checklist: Patient identified, Emergency Drugs available, Suction available, Patient being monitored and Timeout performed Patient Re-evaluated:Patient Re-evaluated prior to induction Oxygen Delivery Method: Simple face mask Preoxygenation: Pre-oxygenation with 100% oxygen Induction Type: IV induction

## 2017-05-12 NOTE — Progress Notes (Signed)
Orthopedic Tech Progress Note Patient Details:  Leslie Martinez 11/08/1965 [id number]  Ortho Devices Type of Ortho Device: Bone foam zero knee Ortho Device/Splint Location: Bone foam provided to pt.   (pt stated she was familiar with the device)  pt currently eating lunch at this time. Bone foam provided at bedside.    Post Interventions Patient Tolerated: Other (comment)(Bone foam provided to pt.   (pt stated she was familiar with the device)  pt currently eating lunch at this time. Bone foam provided at bedside. ) Instructions Provided: Care of device   Kristopher Oppenheim 05/12/2017, 2:41 PM

## 2017-05-12 NOTE — Care Management Note (Addendum)
Case Management Note  Patient Details  Name: Leslie Martinez MRN: [id number] Date of Birth: 1965/04/11  Subjective/Objective:      Right Total Knee Revision              Action/Plan: Spoke to pt and states she would like to go to IP rehab. She lives at home alone. Has RW and cane at home. States last surgery she went [facility] rehab. Will have pt speak to her surgeon for recommendation to IP rehab. CIR will not be able to get auth until Monday, 05/15/2017. CSW referral for SNF rehab [facility] or [facility]).   Expected Discharge Date:                  Expected Discharge Plan:  [place]  In-House Referral:  Clinical Social Work  Discharge planning Services  CM Consult  Post Acute Care Choice:  NA Choice offered to:  NA  DME Arranged:  N/A DME Agency:  NA  HH Arranged:  NA HH Agency:  NA  Status of Service:  In process, will continue to follow  If discussed at Long Length of Stay Meetings, dates discussed:    Additional Comments:  Erenest Rasher, RN 05/12/2017, 6:05 PM

## 2017-05-12 NOTE — Interval H&P Note (Signed)
History and Physical Interval Note:  05/12/2017 7:10 AM  Leslie Martinez  has presented today for surgery, with the diagnosis of LOOSE LEFT TOTAL KNEE ARTHROPLASTY  The various methods of treatment have been discussed with the patient and family. After consideration of risks, benefits and other options for treatment, the patient has consented to  Procedure(s): TOTAL KNEE REVISION (Left) as a surgical intervention .  The patient's history has been reviewed, patient examined, no change in status, stable for surgery.  I have reviewed the patient's chart and labs.  Questions were answered to the patient's satisfaction.     Kerin Salen

## 2017-05-12 NOTE — Op Note (Signed)
PATIENT ID:      Leslie Martinez  MRN:     [id number] DOB/AGE:    [birth year] / 52 y.o.       OPERATIVE REPORT    DATE OF PROCEDURE:  05/12/2017       PREOPERATIVE DIAGNOSIS:   LOOSE LEFT TOTAL KNEE ARTHROPLASTY      Estimated body mass index is 37.66 kg/m as calculated from the following:   Height as of 05/09/17: 5\' 9"  (1.753 m).   Weight as of 05/09/17: 255 lb (115.7 kg).                                                        POSTOPERATIVE DIAGNOSIS:   LOOSE LEFT TOTAL KNEE ARTHROPLASTY                                                                      PROCEDURE:  Procedure(s): TOTAL KNEE REVISION, with removal of loose #6 attune tibial baseplate and revision to 6 attune MBT tray with a 12 mm x 160 mm stem and proximal cement.  We inserted a 10 mm RP bearing to made with the retained #7 left femur.  SURGEON: Kerin Salen    ASSISTANT:   Kerry Hough. [employer]   (Present and scrubbed throughout the case, critical for assistance with exposure, retraction, instrumentation, and closure.)         ANESTHESIA: Spinal, 20cc Exparel, 50cc 0.25% Marcaine  EBL: 300 cc  FLUID REPLACEMENT: 1300 cc crystalloid crystalloid  TOURNIQUET TIME: None  Drains: None  Tranexamic Acid: 1gm IV, 2gm topical  COMPLICATIONS:  None         INDICATIONS FOR PROCEDURE: The patient has  LOOSE LEFT TOTAL KNEE ARTHROPLASTY, primary procedure was done 2 years ago by me she did very well until October when she fell injuring her left total knee.  Initial x-rays showed no evidence of loosening but she developed persistent proximal medial pain over the tibia and radiographs repeated 4 months later did show medial subsidence.  Bone scan was also increased uptake in this area.  Because of increasing pain and decreased function the patient desires revision of the tibial implant of her left total knee other implants will also be assessed at the time of surgery.  She denies any fevers or chills has no wound problems and inflammatory  markers have been negative..  Risks and benefits of surgery have been discussed, questions answered.   DESCRIPTION OF PROCEDURE: The patient identified by armband, received  IV antibiotics, in the holding area at [facility]. Patient taken to the operating room, appropriate anesthetic  monitors were attached, and spinal anesthesia was  induced. Tourniquet  applied high to the operative thigh. Lateral post and foot positioner  applied to the table, the lower extremity was then prepped and draped  in usual sterile fashion from the toes to the tourniquet. Time-out procedure was performed. We began the operation, with the knee flexed 120 degrees, by making the anterior midline incision starting at handbreadth above the patella going over the patella 1  cm medial to and 4 cm distal to the tibial tubercle. Small bleeders in the skin and the  subcutaneous tissue identified and cauterized. Transverse retinaculum was incised and reflected medially and a medial parapatellar arthrotomy was accomplished. the patella was everted and theprepatellar fat pad resected. The superficial medial collateral  ligament was then elevated from anterior to posterior along the proximal  flare of the tibia this exposed to the tibial bearing as well as the tibial baseplate.  We continued to  work our way around posteriorly along the proximal tibia, and externally  rotated the tibia subluxing it out from underneath the femur. A McHale  retractor was placed through the notch and a lateral Hohmann retractor  placed, we continue to remove scar tissue from around the edge of the tibial baseplate.  Once the entire surface had been exposed 1/2 inch osteotome was placed underneath the medial side of the tibial baseplate which was easily elevated using a small mallet.  This was consistent with the aseptic loosening we had surmised.  At this point we did send some tissue for Gram stain and culture that came back with an occasional  polys, and no organisms before the new implants were placed.  Once the tibial plate is been removed we removed the fibrous tissue that was at the interface between the cement and the bone again consistent with loosening as well as fibrous membrane along the stem.  We then sequentially reamed up to a 13 mm reamer to accommodate a 160 mm stem.  With a reamer in place a cutting guide was applied and a 2 degree posterior sloped minimal cut was applied to the proximal tibia depression of the bone surface.  We then applied the 6 trial from the attune revision set and pinned it in place with a 10-hour and then assembled a long 12 mm Pilate on the conical reamer and applied it through the trial baseplate.  Trial baseplate was then removed and a real trial was assembled hammered into place followed by the delta fin keel cut.  At this point we did a trial reduction with 8 and 10 mm RP bearings the 10 mm bearing did come to full extension collateral ligaments were stable.  The trial tibial implant and stem were then removed using a guidepin we placed some small drill holes in the proximal tibia which had sclerotic bone Exparel was injected in the periarticular tissues and the surfaces water picked clean.  We then dried with suction and sponges.  At the back table a 6 attune MBT tray was assembled with a 12 mm x 160 mm stem proximal cement was applied to the implant which was then hammered into place with good firm fixation.  The 10 mm RP bearing was placed on the baseplate and the knee reduced and held in 20 degrees of flexion with compression as the cement hardened.  The rest of the Exparel was then injected into the periarticular tissues followed by irrigation with the pulse lavage. Ligament stability and patellar tracking were checked and found to be excellent. The parapatellar arthrotomy was closed with  running #1 Vicryl suture. The subcutaneous tissue with 0 and 2-0 undyed  Vicryl suture, and the skin with running 3-0  SQ vicryl. A dressing of Xeroform,  4 x 4, dressing sponges, Webril, and Ace wrap applied. The patient  awakened, and taken to recovery room without difficulty.   Kerin Salen 05/12/2017, 9:28 AM

## 2017-05-12 NOTE — Transfer of Care (Signed)
Immediate Anesthesia Transfer of Care Note  Patient: Leslie Martinez  Procedure(s) Performed: TOTAL KNEE REVISION (Left Knee)  Patient Location: PACU  Anesthesia Type:MAC and Spinal  Level of Consciousness: awake and alert   Airway & Oxygen Therapy: Patient Spontanous Breathing and Patient connected to nasal cannula oxygen  Post-op Assessment: Report given to RN and Post -op Vital signs reviewed and stable  Post vital signs: Reviewed and stable  Last Vitals:  Vitals:   05/12/17 0626 05/12/17 1000  BP: (!) 149/87 134/78  Pulse: 68 71  Resp: 18 (!) 6  Temp: 36.8 C 36.6 C  SpO2: 99% 100%    Last Pain:  Vitals:   05/12/17 0645  PainSc: 4       Patients Stated Pain Goal: 3 (57/84/69 [id number])  Complications: No apparent anesthesia complications

## 2017-05-12 NOTE — Anesthesia Procedure Notes (Signed)
Spinal  Patient location during procedure: OR Start time: 05/12/2017 7:30 AM End time: 05/12/2017 7:35 AM Staffing Anesthesiologist: Lynda Rainwater, MD Performed: anesthesiologist  Preanesthetic Checklist Completed: patient identified, site marked, surgical consent, pre-op evaluation, timeout performed, IV checked, risks and benefits discussed and monitors and equipment checked Spinal Block Patient position: sitting Prep: Betadine Patient monitoring: heart rate, cardiac monitor, continuous pulse ox and blood pressure Approach: midline Location: L3-4 Injection technique: single-shot Needle Needle type: Pencan  Needle gauge: 24 G Needle length: 9 cm

## 2017-05-12 NOTE — Evaluation (Signed)
Physical Therapy Evaluation Patient Details Name: Leslie Martinez MRN: [id number] DOB: 1966-03-15 Today's Date: 05/12/2017   History of Present Illness  Pt is a 52 y/o female s/p L total knee revision. PMH inlucdes HTN, sleep apnea, pre-diabetes, and L TKA.   Clinical Impression  Pt is s/p surgery above with deficits below. Pt limited by pain this session, so gait distance limited to chair. REquried min to min guard A for mobility with RW. Supine HEP initiated. Will continue to follow acutely to maximize functional mobility independence and safety.     Follow Up Recommendations DC plan and follow up therapy as arranged by surgeon;Supervision for mobility/OOB(CIR vs SNF )    Equipment Recommendations  None recommended by PT    Recommendations for Other Services       Precautions / Restrictions Precautions Precautions: Knee Precaution Booklet Issued: Yes (comment) Precaution Comments: Reviewed supine ther ex with pt.  Restrictions Weight Bearing Restrictions: Yes LLE Weight Bearing: Weight bearing as tolerated      Mobility  Bed Mobility Overal bed mobility: Needs Assistance Bed Mobility: Supine to Sit     Supine to sit: Supervision     General bed mobility comments: Supervision for safety. Required use of bed rails and elevated HOB.   Transfers Overall transfer level: Needs assistance Equipment used: Rolling walker (2 wheeled) Transfers: Sit to/from Stand Sit to Stand: Min assist         General transfer comment: Min A for lift assist and steadying assist. Verbal cues for safe hand placement.   Ambulation/Gait Ambulation/Gait assistance: Min guard Ambulation Distance (Feet): 5 Feet Assistive device: Rolling walker (2 wheeled) Gait Pattern/deviations: Step-to pattern;Decreased step length - right;Decreased step length - left;Decreased weight shift to left;Antalgic Gait velocity: Decreased  Gait velocity interpretation: Below normal speed for age/gender General Gait  Details: Slow, antalgic gait. Verbal cues for sequencing using RW. Distance limited secondary to pain.   Stairs            Wheelchair Mobility    Modified Rankin (Stroke Patients Only)       Balance Overall balance assessment: Needs assistance Sitting-balance support: No upper extremity supported;Feet supported Sitting balance-[name]: Good     Standing balance support: Bilateral upper extremity supported;During functional activity Standing balance-[name]: Poor Standing balance comment: Reliant on BUE support                              Pertinent Vitals/Pain Pain Assessment: 0-10 Pain Score: 3  Pain Location: L knee  Pain Descriptors / Indicators: Aching;Operative site guarding Pain Intervention(s): Limited activity within patient's tolerance;Monitored during session;Repositioned    Home Living Family/patient expects to be discharged to:: Other (Comment)(Reports CIR vs SNF ) Living Arrangements: Alone                    Prior Function Level of Independence: Independent               Hand Dominance   Dominant Hand: Right    Extremity/Trunk Assessment   Upper Extremity Assessment Upper Extremity Assessment: Defer to OT evaluation    Lower Extremity Assessment Lower Extremity Assessment: LLE deficits/detail LLE Deficits / Details: Sensory in tact. Deficits consistent with post op pain and weakness.     Cervical / Trunk Assessment Cervical / Trunk Assessment: Normal  Communication   Communication: No difficulties  Cognition Arousal/Alertness: Awake/alert Behavior During Therapy: WFL for tasks assessed/performed Overall Cognitive  Status: Within Functional Limits for tasks assessed                                        General Comments General comments (skin integrity, edema, etc.): Pt's mother present during session.     Exercises Total Joint Exercises Ankle Circles/Pumps: AROM;Both;20 reps Quad  Sets: AROM;Left;10 reps Towel Squeeze: AROM;Both;10 reps Heel Slides: AROM;Left;10 reps   Assessment/Plan    PT Assessment Patient needs continued PT services  PT Problem List Decreased strength;Decreased activity tolerance;Decreased balance;Decreased range of motion;Decreased mobility;Decreased knowledge of use of DME;Decreased knowledge of precautions;Pain       PT Treatment Interventions DME instruction;Gait training;Therapeutic activities;Functional mobility training;Therapeutic exercise;Balance training;Neuromuscular re-education;Patient/family education    PT Goals (Current goals can be found in the Care Plan section)  Acute Rehab PT Goals Patient Stated Goal: to decrease pain  PT Goal Formulation: With patient Time For Goal Achievement: 05/26/17 Potential to Achieve Goals: Good    Frequency 7X/week   Barriers to discharge        Co-evaluation               AM-PAC PT "6 Clicks" Daily Activity  Outcome Measure Difficulty turning over in bed (including adjusting bedclothes, sheets and blankets)?: A Little Difficulty moving from lying on back to sitting on the side of the bed? : A Little Difficulty sitting down on and standing up from a chair with arms (e.g., wheelchair, bedside commode, etc,.)?: Unable Help needed moving to and from a bed to chair (including a wheelchair)?: A Little Help needed walking in hospital room?: A Little Help needed climbing 3-5 steps with a railing? : A Lot 6 Click Score: 15    End of Session Equipment Utilized During Treatment: Gait belt Activity Tolerance: Patient limited by pain Patient left: in chair;with call bell/phone within reach;with family/visitor present Nurse Communication: Mobility status PT Visit Diagnosis: Other abnormalities of gait and mobility (R26.89);Pain Pain - Right/Left: Left Pain - part of body: Knee    Time: 9357-0177 PT Time Calculation (min) (ACUTE ONLY): 27 min   Charges:   PT Evaluation $PT Eval Low  Complexity: 1 Low PT Treatments $Therapeutic Activity: 8-22 mins   PT G Codes:        Leighton Ruff, PT, DPT  Acute Rehabilitation Services  Pager: [phone]   Rudean Hitt 05/12/2017, 4:46 PM

## 2017-05-12 NOTE — Anesthesia Preprocedure Evaluation (Signed)
Anesthesia Evaluation  Patient identified by MRN, date of birth, ID band Patient awake    Reviewed: Allergy & Precautions, NPO status , Patient's Chart, lab work & pertinent test results, reviewed documented beta blocker date and time   Airway Mallampati: II  TM Distance: >3 FB Neck ROM: Full    Dental no notable dental hx. (+) Teeth Intact   Pulmonary sleep apnea ,    Pulmonary exam normal breath sounds clear to auscultation       Cardiovascular hypertension, Pt. on medications and Pt. on home beta blockers Normal cardiovascular exam Rhythm:Regular Rate:Normal     Neuro/Psych Anxiety Depression    GI/Hepatic GERD  ,  Endo/Other    Renal/GU      Musculoskeletal  (+) Arthritis , Osteoarthritis,    Abdominal   Peds  Hematology   Anesthesia Other Findings   Reproductive/Obstetrics                             Anesthesia Physical  Anesthesia Plan  ASA: II  Anesthesia Plan: MAC and Spinal   Post-op Pain Management:  Regional for Post-op pain   Induction: Intravenous  PONV Risk Score and Plan: 2 and Ondansetron and Midazolam  Airway Management Planned: Simple Face Mask  Additional Equipment:   Intra-op Plan:   Post-operative Plan:   Informed Consent: I have reviewed the patients History and Physical, chart, labs and discussed the procedure including the risks, benefits and alternatives for the proposed anesthesia with the patient or authorized representative who has indicated his/her understanding and acceptance.   Dental advisory given  Plan Discussed with: CRNA and Anesthesiologist  Anesthesia Plan Comments: (Plan SAB)        Anesthesia Quick Evaluation

## 2017-05-12 NOTE — Anesthesia Procedure Notes (Signed)
Anesthesia Regional Block: Adductor canal block   Pre-Anesthetic Checklist: ,, timeout performed, Correct Patient, Correct Site, Correct Laterality, Correct Procedure, Correct Position, site marked, Risks and benefits discussed,  Surgical consent,  Pre-op evaluation,  At surgeon's request and post-op pain management  Laterality: Left  Prep: chloraprep       Needles:  Injection technique: Single-shot  Needle Type: Stimiplex     Needle Length: 9cm  Needle Gauge: 21     Additional Needles:   Procedures:,,,, ultrasound used (permanent image in chart),,,,  Narrative:  Start time: 05/12/2017 7:21 AM End time: 05/12/2017 7:26 AM Injection made incrementally with aspirations every 5 mL.  Performed by: Personally  Anesthesiologist: Lynda Rainwater, MD

## 2017-05-12 NOTE — Discharge Instructions (Signed)

## 2017-05-12 NOTE — Progress Notes (Signed)
NOTIFIED DR. MILLER THAT PATIENT DID NOT TAKE TOPROL LAST NIGHT (LAST TOOK 05/10/17), BP 149/87+  HR 68.  DR. MILLER STATED WAS OKAY, DON'T NEED TO GIVE THIS AM.

## 2017-05-12 NOTE — Progress Notes (Signed)
Patient admitted to room 5N25 from PACU after a knee revision. Patient alert and oriented, stable vital signs. Denies pain or problems at this time. Knee immobilizer in place.

## 2017-05-12 NOTE — Anesthesia Postprocedure Evaluation (Signed)
Anesthesia Post Note  Patient: Leslie Martinez  Procedure(s) Performed: TOTAL KNEE REVISION (Left Knee)     Patient location during evaluation: PACU Anesthesia Type: MAC and Spinal Level of consciousness: oriented and awake and alert Pain management: pain level controlled Vital Signs Assessment: post-procedure vital signs reviewed and stable Respiratory status: spontaneous breathing and respiratory function stable Cardiovascular status: blood pressure returned to baseline and stable Postop Assessment: no headache, no backache and no apparent nausea or vomiting Anesthetic complications: no    Last Vitals:  Vitals:   05/12/17 1116 05/12/17 1130  BP: (!) 147/95 (!) 175/98  Pulse: 61 74  Resp: 13 17  Temp:    SpO2: 100% 100%    Last Pain:  Vitals:   05/12/17 1138  PainSc: [place] Oryn Casanova

## 2017-05-13 DIAGNOSIS — Z96659 Presence of unspecified artificial knee joint: Secondary | ICD-10-CM

## 2017-05-13 DIAGNOSIS — T84038A Mechanical loosening of other internal prosthetic joint, initial encounter: Secondary | ICD-10-CM

## 2017-05-13 LAB — BASIC METABOLIC PANEL
ANION GAP: 9 (ref 5–15)
ANION GAP: 9 (ref 5–15)
BUN: 15 mg/dL (ref 6–20)
BUN: 23 mg/dL — ABNORMAL HIGH (ref 6–20)
CALCIUM: 8.8 mg/dL — AB (ref 8.9–10.3)
CALCIUM: 8.8 mg/dL — AB (ref 8.9–10.3)
CHLORIDE: 100 mmol/L — AB (ref 101–111)
CHLORIDE: 102 mmol/L (ref 101–111)
CO2: 26 mmol/L (ref 22–32)
CO2: 22 mmol/L (ref 22–32)
CREATININE: 1.87 mg/dL — AB (ref 0.44–1.00)
Creatinine, Ser: 1.97 mg/dL — ABNORMAL HIGH (ref 0.44–1.00)
GFR calc Af Amer: 35 mL/min — ABNORMAL LOW (ref >60)
GFR calc Af Amer: 32 mL/min — ABNORMAL LOW (ref >60)
GFR calc non Af Amer: 30 mL/min — ABNORMAL LOW (ref >60)
GFR calc non Af Amer: 28 mL/min — ABNORMAL LOW (ref >60)
GLUCOSE: 144 mg/dL — AB (ref 65–99)
GLUCOSE: 240 mg/dL — AB (ref 65–99)
Potassium: 4.7 mmol/L (ref 3.5–5.1)
Potassium: 4.6 mmol/L (ref 3.5–5.1)
Sodium: 135 mmol/L (ref 135–145)
Sodium: 133 mmol/L — ABNORMAL LOW (ref 135–145)

## 2017-05-13 LAB — CBC
HCT: 35.7 % — ABNORMAL LOW (ref 36.0–46.0)
Hemoglobin: 11.1 g/dL — ABNORMAL LOW (ref 12.0–15.0)
MCH: 26.9 pg (ref 26.0–34.0)
MCHC: 31.1 g/dL (ref 30.0–36.0)
MCV: 86.4 fL (ref 78.0–100.0)
Platelets: 170 10*3/uL (ref 150–400)
RBC: 4.13 MIL/uL (ref 3.87–5.11)
RDW: 13.7 % (ref 11.5–15.5)
WBC: 7.9 10*3/uL (ref 4.0–10.5)

## 2017-05-13 LAB — GLUCOSE, CAPILLARY: GLUCOSE-CAPILLARY: 290 mg/dL — AB (ref 65–99)

## 2017-05-13 MED ORDER — INSULIN ASPART 100 UNIT/ML ~~LOC~~ SOLN: 0.0000 [IU] | Freq: Three times a day (TID) | SUBCUTANEOUS | Status: DC

## 2017-05-13 MED ORDER — INSULIN ASPART 100 UNIT/ML ~~LOC~~ SOLN
0.0000 [IU] | Freq: Three times a day (TID) | SUBCUTANEOUS | Status: DC
  Administered 2017-05-14: 2 [IU] via SUBCUTANEOUS
  Administered 2017-05-14: 3 [IU] via SUBCUTANEOUS
  Administered 2017-05-16: 2 [IU] via SUBCUTANEOUS

## 2017-05-13 MED ORDER — LACTATED RINGERS IV SOLN
INTRAVENOUS | Status: AC
  Administered 2017-05-13 – 2017-05-14 (×2): via INTRAVENOUS

## 2017-05-13 NOTE — Evaluation (Signed)
Occupational Therapy Evaluation Patient Details Name: Leslie Martinez MRN: [id number] DOB: 1965/09/08 Today's Date: 05/13/2017    History of Present Illness Pt is a 52 y/o female s/p L total knee revision. PMH inlucdes HTN, sleep apnea, pre-diabetes, and L TKA.    Clinical Impression   Pt was independent prior to admission. Presents with mild L knee pain and impaired dynamic standing balance. Pt is able to reach her L foot for bathing and dressing and is functioning at a supervision level in basic ADL. Pt plans to discharge to SNF as she did with her original L TKA and receive a brief period of rehab as she lives alone. Will follow acutely.   Follow Up Recommendations  DC plan and follow up therapy as arranged by surgeon    Equipment Recommendations  3 in 1 bedside commode    Recommendations for Other Services       Precautions / Restrictions Precautions Precautions: Knee Precaution Comments: Reviewed no pillow under knee. Restrictions Weight Bearing Restrictions: Yes LLE Weight Bearing: Weight bearing as tolerated      Mobility Bed Mobility         Supine to sit: Supervision;HOB elevated     General bed mobility comments: pt in chair  Transfers Overall transfer level: Needs assistance Equipment used: Rolling walker (2 wheeled) Transfers: Sit to/from Stand Sit to Stand: Supervision         General transfer comment: from chair and 3 in 1    Balance Overall balance assessment: Needs assistance Sitting-balance support: No upper extremity supported;Feet supported Sitting balance-[name]: Good Sitting balance - Comments: no LOB with getting down to L foot   Standing balance support: Bilateral upper extremity supported;During functional activity Standing balance-[name]: Fair Standing balance comment: can release walker in static standing to pull up pants, stand at sink                           ADL either performed or assessed with clinical  judgement   ADL                                         General ADL Comments: performing ADL with set up to supervision, able to reach down to L foot in sitting     Vision Baseline Vision/History: No visual deficits       Perception     Praxis      Pertinent Vitals/Pain Pain Assessment: Faces Pain Score: 0-No pain Faces Pain Scale: Hurts a little bit Pain Location: L knee  Pain Descriptors / Indicators: Sore Pain Intervention(s): Monitored during session;Premedicated before session     Hand Dominance Right   Extremity/Trunk Assessment Upper Extremity Assessment Upper Extremity Assessment: Overall WFL for tasks assessed   Lower Extremity Assessment Lower Extremity Assessment: Defer to PT evaluation   Cervical / Trunk Assessment Cervical / Trunk Assessment: Normal   Communication Communication Communication: No difficulties   Cognition Arousal/Alertness: Awake/alert Behavior During Therapy: WFL for tasks assessed/performed Overall Cognitive Status: Within Functional Limits for tasks assessed                                     General Comments       Exercises    Shoulder Instructions  Home Living Family/patient expects to be discharged to:: Skilled nursing facility Living Arrangements: Alone                                      Prior Functioning/Environment Level of Independence: Independent        Comments: has a tub/shower and standard toilet        OT Problem List: Impaired balance (sitting and/or standing);Decreased knowledge of use of DME or AE;Pain      OT Treatment/Interventions: Self-care/ADL training;DME and/or AE instruction;Patient/family education;Balance training    OT Goals(Current goals can be found in the care plan section) Acute Rehab OT Goals Patient Stated Goal: go to rehab and return home independently OT Goal Formulation: With patient/family Time For Goal Achievement:  05/27/17 Potential to Achieve Goals: Good ADL Goals Pt Will Perform Grooming: with modified independence;standing Pt Will Perform Lower Body Bathing: with modified independence;sit to/from stand Pt Will Perform Lower Body Dressing: with modified independence;sit to/from stand Pt Will Transfer to Toilet: with modified independence;ambulating;bedside commode Pt Will Perform Toileting - Clothing Manipulation and hygiene: with modified independence;sit to/from stand Pt Will Perform Tub/Shower Transfer: Tub transfer;with supervision;ambulating;tub bench;rolling walker;3 in 1  OT Frequency: Min 2X/week   Barriers to D/C: Decreased caregiver support          Co-evaluation              AM-PAC PT "6 Clicks" Daily Activity     Outcome Measure Help from another person eating meals?: None Help from another person taking care of personal grooming?: A Little Help from another person toileting, which includes using toliet, bedpan, or urinal?: A Little Help from another person bathing (including washing, rinsing, drying)?: A Little Help from another person to put on and taking off regular upper body clothing?: None Help from another person to put on and taking off regular lower body clothing?: A Little 6 Click Score: 20   End of Session Equipment Utilized During Treatment: Gait belt;Rolling walker  Activity Tolerance: Patient tolerated treatment well Patient left: in chair;with call bell/phone within reach;with family/visitor present;with nursing/sitter in room  OT Visit Diagnosis: Unsteadiness on feet (R26.81);Other abnormalities of gait and mobility (R26.89);Pain Pain - Right/Left: Left Pain - part of body: Knee                Time: 7371-0626 OT Time Calculation (min): 18 min Charges:  OT General Charges $OT Visit: 1 Visit OT Evaluation $OT Eval Low Complexity: 1 Low G-Codes:     05/24/17 Nestor Lewandowsky, OTR/L Pager: [phone] Werner Lean, Haze Boyden May 24, 2017, 12:03 PM

## 2017-05-13 NOTE — Progress Notes (Signed)
   PATIENT ID: Leslie Martinez   1 Day Post-Op Procedure(s) (LRB): TOTAL KNEE REVISION (Left)  Subjective: Reports doing well, manageable knee pain. Feels okay to get up with PT. Okay for dispo to SNF or  IR if qualified. Creatinine up to 1.87 w no known hx of kidney problems.  Objective:  Vitals:   05/13/17 0120 05/13/17 0631  BP: 101/65 103/62  Pulse: 71 70  Resp: 16 16  Temp: 99 F (37.2 C) 98 F (36.7 C)  SpO2: 94% 96%     L knee dressings c/d/o Wiggles toes, distally NVI  Labs:  Recent Labs    05/13/17 0610  HGB 11.1*   Recent Labs    05/13/17 0610  WBC 7.9  RBC 4.13  HCT 35.7*  PLT 170   Recent Labs    05/13/17 0610  NA 135  K 4.7  CL 100*  CO2 26  BUN 15  CREATININE 1.87*  GLUCOSE 144*  CALCIUM 8.8*    Assessment and Plan: 1 day s/p left TKA Doing well, get up with PT, WBAT Creatinine high this am, continue IV at 125/hr, will repeat BMP this afternoon and likely delay any d/c until tomorrow or Monday (if needed for SNF) to make sure this improves ABLA-expected PO, continue to follow  VTE proph: asa, scds

## 2017-05-13 NOTE — Plan of Care (Signed)
  Nutrition: Adequate nutrition will be maintained 05/13/2017 1047 - Progressing by Williams Che, RN   Elimination: Will not experience complications related to bowel motility 05/13/2017 1047 - Progressing by Williams Che, RN   Pain Managment: General experience of comfort will improve 05/13/2017 1047 - Progressing by Williams Che, RN   Safety: Ability to remain free from injury will improve 05/13/2017 1047 - Progressing by Williams Che, RN

## 2017-05-13 NOTE — Progress Notes (Signed)
Physical Therapy Treatment Patient Details Name: Leslie Martinez MRN: [id number] DOB: 1965/08/31 Today's Date: 05/13/2017    History of Present Illness Pt is a 52 y/o female s/p L total knee revision. PMH inlucdes HTN, sleep apnea, pre-diabetes, and L TKA.     PT Comments    Pt supine in bed on arrival. Agreeable to participation in therapy session. Pt performed LLE exercises  prior to mobilizing OOB. Pt able to ambulate 165 feet with RW min guard assist.   Follow Up Recommendations  DC plan and follow up therapy as arranged by surgeon;Supervision for mobility/OOB(CIR vs SNF)     Equipment Recommendations  None recommended by PT    Recommendations for Other Services       Precautions / Restrictions Precautions Precautions: Knee Precaution Comments: Reviewed no pillow under knee. Restrictions LLE Weight Bearing: Weight bearing as tolerated    Mobility  Bed Mobility         Supine to sit: Supervision;HOB elevated     General bed mobility comments: +rail, supervision for safety  Transfers   Equipment used: Rolling walker (2 wheeled)   Sit to Stand: Min guard         General transfer comment: verbal cues for hand placement. Increased time to stabilize initial standing balance.  Ambulation/Gait Ambulation/Gait assistance: Min guard Ambulation Distance (Feet): 165 Feet Assistive device: Rolling walker (2 wheeled) Gait Pattern/deviations: Step-through pattern;Decreased stride length;Decreased weight shift to left Gait velocity: Decreased  Gait velocity interpretation: Below normal speed for age/gender General Gait Details: verbal cues for step-through pattern and toe off LLE.   Stairs            Wheelchair Mobility    Modified Rankin (Stroke Patients Only)       Balance   Sitting-balance support: No upper extremity supported;Feet supported Sitting balance-[name]: Good     Standing balance support: Bilateral upper extremity supported;During  functional activity Standing balance-[name]: Fair                              Cognition Arousal/Alertness: Awake/alert Behavior During Therapy: WFL for tasks assessed/performed Overall Cognitive Status: Within Functional Limits for tasks assessed                                        Exercises Total Joint Exercises Ankle Circles/Pumps: AROM;Both;20 reps Quad Sets: AROM;Left;10 reps Heel Slides: AROM;Left;10 reps Hip ABduction/ADduction: AROM;Left;10 reps Straight Leg Raises: AAROM;Left;10 reps    General Comments        Pertinent Vitals/Pain Pain Assessment: 0-10 Pain Score: 0-No pain Pain Intervention(s): Premedicated before session    Home Living                      Prior Function            PT Goals (current goals can now be found in the care plan section) Acute Rehab PT Goals Patient Stated Goal: independence PT Goal Formulation: With patient Time For Goal Achievement: 05/26/17 Potential to Achieve Goals: Good Progress towards PT goals: Progressing toward goals    Frequency    7X/week      PT Plan Current plan remains appropriate    Co-evaluation              AM-PAC PT "6 Clicks" Daily Activity  Outcome Measure  Difficulty turning over in bed (including adjusting bedclothes, sheets and blankets)?: A Little Difficulty moving from lying on back to sitting on the side of the bed? : A Little Difficulty sitting down on and standing up from a chair with arms (e.g., wheelchair, bedside commode, etc,.)?: A Little Help needed moving to and from a bed to chair (including a wheelchair)?: A Little Help needed walking in hospital room?: A Little Help needed climbing 3-5 steps with a railing? : A Little 6 Click Score: 18    End of Session Equipment Utilized During Treatment: Gait belt Activity Tolerance: Patient tolerated treatment well Patient left: in chair;with call bell/phone within reach Nurse  Communication: Mobility status PT Visit Diagnosis: Other abnormalities of gait and mobility (R26.89);Pain Pain - Right/Left: Left Pain - part of body: Knee     Time: 5993-5701 PT Time Calculation (min) (ACUTE ONLY): 25 min  Charges:  $Gait Training: 8-22 mins $Therapeutic Exercise: 8-22 mins                    G Codes:       Lorrin Goodell, PT  Office # [phone] Pager [phone]    Lorriane Shire 05/13/2017, 10:32 AM

## 2017-05-13 NOTE — Progress Notes (Signed)
Physical Therapy Treatment Patient Details Name: Leslie Martinez MRN: [id number] DOB: Jan 08, 1966 Today's Date: 05/13/2017    History of Present Illness Pt is a 52 y/o female s/p L total knee revision. PMH inlucdes HTN, sleep apnea, pre-diabetes, and L TKA.     PT Comments    Pt with c/o increased pain this PM. Pain meds received just prior to Rx. Pt ambulated 150 feet with RW min guard assist. Returned to bed with ice L knee at end of session.   Follow Up Recommendations  DC plan and follow up therapy as arranged by surgeon;Supervision for mobility/OOB     Equipment Recommendations  None recommended by PT    Recommendations for Other Services       Precautions / Restrictions Precautions Precautions: Knee Restrictions LLE Weight Bearing: Weight bearing as tolerated    Mobility  Bed Mobility         Supine to sit: Min assist;HOB elevated     General bed mobility comments: +rail, assist with LLE  Transfers   Equipment used: Rolling walker (2 wheeled)   Sit to Stand: Supervision         General transfer comment: supervision for safety  Ambulation/Gait Ambulation/Gait assistance: Min guard Ambulation Distance (Feet): 150 Feet Assistive device: Rolling walker (2 wheeled) Gait Pattern/deviations: Step-through pattern;Decreased stride length;Decreased weight shift to left Gait velocity: Decreased    General Gait Details: verbal cues for step-through pattern and toe off LLE.   Stairs            Wheelchair Mobility    Modified Rankin (Stroke Patients Only)       Balance                                            Cognition Arousal/Alertness: Awake/alert Behavior During Therapy: WFL for tasks assessed/performed Overall Cognitive Status: Within Functional Limits for tasks assessed                                        Exercises Total Joint Exercises Ankle Circles/Pumps: AROM;Both;20 reps    General Comments         Pertinent Vitals/Pain Pain Assessment: 0-10 Pain Score: 5  Pain Location: L knee  Pain Descriptors / Indicators: Sore Pain Intervention(s): Limited activity within patient's tolerance;Monitored during session;Ice applied;Premedicated before session    Home Living                      Prior Function            PT Goals (current goals can now be found in the care plan section) Acute Rehab PT Goals Patient Stated Goal: go to rehab and return home independently PT Goal Formulation: With patient Time For Goal Achievement: 05/26/17 Potential to Achieve Goals: Good Progress towards PT goals: Progressing toward goals    Frequency    7X/week      PT Plan Current plan remains appropriate    Co-evaluation              AM-PAC PT "6 Clicks" Daily Activity  Outcome Measure  Difficulty turning over in bed (including adjusting bedclothes, sheets and blankets)?: A Little Difficulty moving from lying on back to sitting on the side of the bed? : A Little Difficulty sitting  down on and standing up from a chair with arms (e.g., wheelchair, bedside commode, etc,.)?: A Little Help needed moving to and from a bed to chair (including a wheelchair)?: A Little Help needed walking in hospital room?: A Little Help needed climbing 3-5 steps with a railing? : A Little 6 Click Score: 18    End of Session Equipment Utilized During Treatment: Gait belt Activity Tolerance: Patient tolerated treatment well Patient left: in bed;with call bell/phone within reach;with family/visitor present Nurse Communication: Mobility status PT Visit Diagnosis: Other abnormalities of gait and mobility (R26.89);Pain Pain - Right/Left: Left Pain - part of body: Knee     Time: 1441-1501 PT Time Calculation (min) (ACUTE ONLY): 20 min  Charges:  $Gait Training: 8-22 mins                    G Codes:       Lorrin Goodell, PT  Office # [phone] Pager [phone]    Lorriane Shire 05/13/2017, 3:26 PM
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
## 2017-05-14 DIAGNOSIS — I10 Essential (primary) hypertension: Secondary | ICD-10-CM

## 2017-05-14 DIAGNOSIS — N179 Acute kidney failure, unspecified: Secondary | ICD-10-CM

## 2017-05-14 DIAGNOSIS — E119 Type 2 diabetes mellitus without complications: Secondary | ICD-10-CM

## 2017-05-14 LAB — CBC
HCT: 31.9 % — ABNORMAL LOW (ref 36.0–46.0)
Hemoglobin: 9.7 g/dL — ABNORMAL LOW (ref 12.0–15.0)
MCH: 26.3 pg (ref 26.0–34.0)
MCHC: 30.4 g/dL (ref 30.0–36.0)
MCV: 86.4 fL (ref 78.0–100.0)
PLATELETS: 145 10*3/uL — AB (ref 150–400)
RBC: 3.69 MIL/uL — AB (ref 3.87–5.11)
RDW: 13.6 % (ref 11.5–15.5)
WBC: 10.6 10*3/uL — ABNORMAL HIGH (ref 4.0–10.5)

## 2017-05-14 LAB — URINALYSIS, ROUTINE W REFLEX MICROSCOPIC
BACTERIA UA: NONE SEEN
Bilirubin Urine: NEGATIVE
HGB URINE DIPSTICK: NEGATIVE
Ketones, ur: NEGATIVE mg/dL
Leukocytes, UA: NEGATIVE
NITRITE: NEGATIVE
Protein, ur: NEGATIVE mg/dL
SPECIFIC GRAVITY, URINE: 1.007 (ref 1.005–1.030)
pH: 5 (ref 5.0–8.0)

## 2017-05-14 LAB — BASIC METABOLIC PANEL
Anion gap: 8 (ref 5–15)
BUN: 18 mg/dL (ref 6–20)
CALCIUM: 9.1 mg/dL (ref 8.9–10.3)
CHLORIDE: 106 mmol/L (ref 101–111)
CO2: 24 mmol/L (ref 22–32)
CREATININE: 1.22 mg/dL — AB (ref 0.44–1.00)
GFR calc non Af Amer: 50 mL/min — ABNORMAL LOW (ref >60)
GFR, EST AFRICAN AMERICAN: 58 mL/min — AB (ref >60)
Glucose, Bld: 186 mg/dL — ABNORMAL HIGH (ref 65–99)
Potassium: 4.4 mmol/L (ref 3.5–5.1)
SODIUM: 138 mmol/L (ref 135–145)

## 2017-05-14 LAB — SODIUM, URINE, RANDOM: SODIUM UR: 33 mmol/L

## 2017-05-14 LAB — GLUCOSE, CAPILLARY
GLUCOSE-CAPILLARY: 142 mg/dL — AB (ref 65–99)
GLUCOSE-CAPILLARY: 106 mg/dL — AB (ref 65–99)
GLUCOSE-CAPILLARY: 170 mg/dL — AB (ref 65–99)
Glucose-Capillary: 127 mg/dL — ABNORMAL HIGH (ref 65–99)

## 2017-05-14 LAB — HEMOGLOBIN A1C
HEMOGLOBIN A1C: 6.8 % — AB (ref 4.8–5.6)
Mean Plasma Glucose: 148.46 mg/dL

## 2017-05-14 LAB — CREATININE, URINE, RANDOM: Creatinine, Urine: 52.28 mg/dL

## 2017-05-14 MED ORDER — LACTATED RINGERS IV SOLN
INTRAVENOUS | Status: AC
  Administered 2017-05-14 (×2): via INTRAVENOUS

## 2017-05-14 NOTE — Plan of Care (Signed)
Patient experienced urinary retention: unable to urinate, random bladder scan of 923ml.   Foley ordered per MD prior to the finding of retention with labs related to kidney function.
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
## 2017-05-14 NOTE — Plan of Care (Signed)
  Activity: Risk for activity intolerance will decrease 05/14/2017 1307 - Progressing by Williams Che, RN   Pain Managment: General experience of comfort will improve 05/14/2017 1307 - Progressing by Williams Che, RN   Safety: Ability to remain free from injury will improve 05/14/2017 1307 - Progressing by Williams Che, RN
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
## 2017-05-14 NOTE — Progress Notes (Signed)
Physical Therapy Treatment Patient Details Name: Leslie Martinez MRN: [id number] DOB: 1965-08-22 Today's Date: 05/14/2017    History of Present Illness Pt is a 52 y/o female s/p L total knee revision. PMH inlucdes HTN, sleep apnea, pre-diabetes, and L TKA.     PT Comments    Patient continues to make progress toward mobility goals. Continue to progress as tolerated.    Follow Up Recommendations  DC plan and follow up therapy as arranged by surgeon;Supervision for mobility/OOB     Equipment Recommendations  None recommended by PT    Recommendations for Other Services       Precautions / Restrictions Precautions Precautions: Knee Precaution Booklet Issued: Yes (comment) Precaution Comments: Reviewed no pillow under knee. Restrictions Weight Bearing Restrictions: Yes LLE Weight Bearing: Weight bearing as tolerated    Mobility  Bed Mobility Overal bed mobility: Needs Assistance Bed Mobility: Supine to Sit     Supine to sit: HOB elevated;Min guard     General bed mobility comments: min guard for safety; HOB elevated and use of rail  Transfers Overall transfer level: Needs assistance Equipment used: Rolling walker (2 wheeled) Transfers: Sit to/from Stand Sit to Stand: Supervision         General transfer comment: supervision for safety  Ambulation/Gait Ambulation/Gait assistance: Min guard Ambulation Distance (Feet): 150 Feet Assistive device: Rolling walker (2 wheeled) Gait Pattern/deviations: Step-through pattern;Decreased stride length;Decreased weight shift to left Gait velocity: Decreased    General Gait Details: cues for L heel strike, proximity to RW, and posture   Stairs            Wheelchair Mobility    Modified Rankin (Stroke Patients Only)       Balance Overall balance assessment: Needs assistance Sitting-balance support: No upper extremity supported;Feet supported Sitting balance-[name]: Good     Standing balance support:  Bilateral upper extremity supported;During functional activity Standing balance-[name]: Fair                              Cognition Arousal/Alertness: Awake/alert Behavior During Therapy: WFL for tasks assessed/performed Overall Cognitive Status: Within Functional Limits for tasks assessed                                        Exercises Total Joint Exercises Ankle Circles/Pumps: AROM;Both;10 reps Quad Sets: AROM;Left;10 reps Short Arc QuadSinclair Ship;Left;10 reps Heel Slides: AROM;Left;10 reps Hip ABduction/ADduction: AROM;Left;10 reps Straight Leg Raises: AAROM;Left;10 reps Goniometric ROM: 80 degrees flexion    General Comments General comments (skin integrity, edema, etc.): family present       Pertinent Vitals/Pain Pain Assessment: Faces Faces Pain Scale: Hurts little more Pain Location: L knee  Pain Descriptors / Indicators: Sore;Grimacing;Guarding Pain Intervention(s): Limited activity within patient's tolerance;Monitored during session;Repositioned;Patient requesting pain meds-RN notified    Home Living                      Prior Function            PT Goals (current goals can now be found in the care plan section) Acute Rehab PT Goals Patient Stated Goal: go to rehab and return home independently PT Goal Formulation: With patient Time For Goal Achievement: 05/26/17 Potential to Achieve Goals: Good Progress towards PT goals: Progressing toward goals    Frequency    7X/week  PT Plan Current plan remains appropriate    Co-evaluation              AM-PAC PT "6 Clicks" Daily Activity  Outcome Measure  Difficulty turning over in bed (including adjusting bedclothes, sheets and blankets)?: A Little Difficulty moving from lying on back to sitting on the side of the bed? : Unable Difficulty sitting down on and standing up from a chair with arms (e.g., wheelchair, bedside commode, etc,.)?: A Lot Help needed  moving to and from a bed to chair (including a wheelchair)?: A Little Help needed walking in hospital room?: A Little Help needed climbing 3-5 steps with a railing? : A Little 6 Click Score: 15    End of Session Equipment Utilized During Treatment: Gait belt Activity Tolerance: Patient tolerated treatment well Patient left: with call bell/phone within reach;with family/visitor present;in chair Nurse Communication: Mobility status PT Visit Diagnosis: Other abnormalities of gait and mobility (R26.89);Pain Pain - Right/Left: Left Pain - part of body: Knee     Time: 0712-1975 PT Time Calculation (min) (ACUTE ONLY): 31 min  Charges:  $Gait Training: 8-22 mins $Therapeutic Exercise: 8-22 mins                    G Codes:       Earney Navy, PTA Pager: [phone]     Darliss Cheney 05/14/2017, 3:23 PM

## 2017-05-15 DIAGNOSIS — E119 Type 2 diabetes mellitus without complications: Secondary | ICD-10-CM

## 2017-05-15 LAB — UREA NITROGEN, URINE: UREA NITROGEN UR: 386 mg/dL

## 2017-05-15 LAB — BASIC METABOLIC PANEL
ANION GAP: 9 (ref 5–15)
BUN: 14 mg/dL (ref 6–20)
CALCIUM: 9 mg/dL (ref 8.9–10.3)
CO2: 28 mmol/L (ref 22–32)
Chloride: 103 mmol/L (ref 101–111)
Creatinine, Ser: 0.98 mg/dL (ref 0.44–1.00)
GFR calc Af Amer: >60 mL/min (ref >60)
Glucose, Bld: 103 mg/dL — ABNORMAL HIGH (ref 65–99)
POTASSIUM: 4.3 mmol/L (ref 3.5–5.1)
SODIUM: 140 mmol/L (ref 135–145)

## 2017-05-15 LAB — GLUCOSE, CAPILLARY
GLUCOSE-CAPILLARY: 87 mg/dL (ref 65–99)
GLUCOSE-CAPILLARY: 157 mg/dL — AB (ref 65–99)
Glucose-Capillary: 79 mg/dL (ref 65–99)
Glucose-Capillary: 96 mg/dL (ref 65–99)

## 2017-05-15 LAB — CBC
HCT: 31.9 % — ABNORMAL LOW (ref 36.0–46.0)
Hemoglobin: 9.7 g/dL — ABNORMAL LOW (ref 12.0–15.0)
MCH: 26.4 pg (ref 26.0–34.0)
MCHC: 30.4 g/dL (ref 30.0–36.0)
MCV: 86.7 fL (ref 78.0–100.0)
PLATELETS: 145 10*3/uL — AB (ref 150–400)
RBC: 3.68 MIL/uL — AB (ref 3.87–5.11)
RDW: 14 % (ref 11.5–15.5)
WBC: 6.2 10*3/uL (ref 4.0–10.5)

## 2017-05-15 NOTE — Discharge Summary (Addendum)
Patient ID: Leslie Martinez MRN: [id number] DOB/AGE: 09/20/1965 52 y.o.  Admit date: 05/12/2017 Discharge date: 05/16/2017  Admission Diagnoses:  Principal Problem:   Loosening of knee joint prosthesis ([name]) Active Problems:   Essential hypertension   Primary osteoarthritis of left knee   AKI (acute kidney injury) ([name])   Type 2 diabetes mellitus without complication [name])   Discharge Diagnoses:  Same  Past Medical History:  Diagnosis Date  . Anxiety   . Asthma    seasonal allergies, dry cough today   . 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 Cervical cancer ([place])    "stage III; discovered S/P hysterectomy" (05/12/2017)  . Chronic bronchitis ([place])   . Depression   . Esophageal stricture   . Esophagitis   . Gastritis   . GERD (gastroesophageal reflux disease)   . Hiatal hernia    history of, treated with surgery  . History of blood transfusion 1990s   "related to childbirth"  . History of kidney stones    passed stones spontaneously, also cystoscopy- done   . Hypertension   . Migraine    "monthly" (05/12/2017)  . Osteoarthritis of both knees   . Pancreatitis   . Pneumonia 2000s X1  . Pre-diabetes   . 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 Seasonal allergies   . Sleep apnea    Had nasal sx, and uses no cpap    Surgeries: Procedure(s): TOTAL KNEE REVISION on 05/12/2017   Consultants: Treatment Team:  [name], MD [name], MD  Discharged Condition: Improved  Hospital Course: Leslie Martinez is an 52 y.o. female who was admitted 05/12/2017 for operative treatment ofLoosening of knee joint prosthesis ([name]). Patient has severe unremitting pain that affects sleep, daily activities, and work/hobbies. After pre-op clearance the patient was taken to the operating room on 05/12/2017 and underwent  Procedure(s): TOTAL KNEE REVISION.    Patient was given perioperative antibiotics:  Anti-infectives (From admission, onward)   Start     Dose/Rate Route Frequency Ordered  Stop   05/12/17 0600  vancomycin (VANCOCIN) 1,500 mg in sodium chloride 0.9 % 500 mL IVPB     1,500 mg 250 mL/hr over 120 Minutes Intravenous To Short Stay 05/11/17 0819 05/12/17 0830       Patient was given sequential compression devices, early ambulation, and chemoprophylaxis to prevent DVT.  Patient benefited maximally from hospital stay and there were no complications.    Recent vital signs:  Patient Vitals for the past 24 hrs:  BP Temp Temp src Pulse Resp SpO2  05/15/17 0648 132/77 98.2 F (36.8 C) Oral 62 16 97 %  05/14/17 2107 (!) 132/56 98.2 F (36.8 C) Oral (!) 56 16 100 %  05/14/17 1444 (!) 100/54 97.9 F (36.6 C) Oral (!) 51 16 99 %     Recent laboratory studies:  Recent Labs    05/14/17 0935 05/15/17 0619  WBC 10.6* 6.2  HGB 9.7* 9.7*  HCT 31.9* 31.9*  PLT 145* 145*  NA 138 140  K 4.4 4.3  CL 106 103  CO2 24 28  BUN 18 14  CREATININE 1.22* 0.98  GLUCOSE 186* 103*  CALCIUM 9.1 9.0     Discharge Medications:   Allergies as of 05/15/2017      Reactions   Adhesive [tape] Hives   Iodine Hives, Rash   Lisinopril Cough   Cephalexin Hives, Rash   Compazine [prochlorperazine Edisylate]    Hyperactivity   Latex Itching   Oxycodone-acetaminophen Itching,  Rash      Medication List    STOP taking these medications   cyclobenzaprine 10 MG tablet Commonly known as:  FLEXERIL   olmesartan-hydrochlorothiazide 40-25 MG tablet Commonly known as:  BENICAR HCT   traMADol 50 MG tablet Commonly known as:  ULTRAM     TAKE these medications   albuterol 108 (90 Base) MCG/ACT inhaler Commonly known as:  PROVENTIL HFA;VENTOLIN HFA Inhale 2 puffs into the lungs every 6 (six) hours as needed for wheezing or shortness of breath.   aspirin EC 325 MG tablet Take 1 tablet (325 mg total) by mouth 2 (two) times daily. What changed:    medication strength  how much to take  when to take this   cetirizine 10 MG tablet Commonly known as:  ZYRTEC Take 10 mg by  mouth daily as needed for allergies.   lipase/protease/amylase 36000 UNITS Cpep capsule Commonly known as:  CREON Take 2 capsules with each meal and 1 capsule with a snack daily What changed:    how much to take  how to take this  when to take this  additional instructions   Melatonin 5 MG Tabs Take 5 mg by mouth at bedtime.   metoprolol succinate 100 MG 24 hr tablet Commonly known as:  TOPROL-XL Take 200 mg by mouth at bedtime.   omeprazole 40 MG capsule Commonly known as:  PRILOSEC Take 1 capsule (40 mg total) by mouth daily.   oxyCODONE-acetaminophen 5-325 MG tablet Commonly known as:  PERCOCET/ROXICET Take 1 tablet by mouth every 4 (four) hours as needed (for pain.). What changed:  when to take this   sertraline 100 MG tablet Commonly known as:  ZOLOFT Take 100 mg by mouth daily.   tiZANidine 2 MG tablet Commonly known as:  ZANAFLEX Take 1 tablet (2 mg total) by mouth every 6 (six) hours as needed for muscle spasms.            Durable Medical Equipment  (From admission, onward)        Start     Ordered   05/12/17 1241  DME Walker rolling  Once    Question:  Patient needs a walker to treat with the following condition  Answer:  Status post total left knee replacement   05/12/17 1240   05/12/17 1241  DME 3 n 1  Once     05/12/17 1240   05/12/17 1241  DME Bedside commode  Once    Question:  Patient needs a bedside commode to treat with the following condition  Answer:  Status post total left knee replacement   05/12/17 1240      Diagnostic Studies: Dg Chest 2 View  Result Date: 05/09/2017 CLINICAL DATA:  Preop for left knee revision.  Hypertension. EXAM: CHEST  2 VIEW COMPARISON:  Radiographs of April 22, 2016. FINDINGS: The heart size and mediastinal contours are within normal limits. Both lungs are clear. The visualized skeletal structures are unremarkable. IMPRESSION: No active cardiopulmonary disease. Electronically Signed   By: Marijo Conception,  M.D.   On: 05/09/2017 17:51   Dg Knee 1-2 Views Left  Result Date: 05/12/2017 CLINICAL DATA:  Status post revision of left total knee arthroplasty. EXAM: LEFT KNEE - 1-2 VIEW COMPARISON:  None. FINDINGS: Left knee prosthetic components appear well seated and aligned. The tibial component has a long intramedullary stem. There is no acute fracture or evidence of an operative complication. IMPRESSION: Well-positioned left knee arthroplasty. Electronically Signed   By: Shanon Brow  Ormond M.D.   On: 05/12/2017 11:12   Nm Bone Scan 3 Phase  Result Date: 04/20/2017 CLINICAL DATA:  LEFT knee pain.  Knee arthroplasty 2,017 EXAM: NUCLEAR MEDICINE 3-PHASE BONE SCAN TECHNIQUE: Radionuclide angiographic images, immediate static blood pool images, and 3-hour delayed static images were obtained of the knees after intravenous injection of radiopharmaceutical. RADIOPHARMACEUTICALS:  21.3 mCi Tc-43m MDP COMPARISON:  None. FINDINGS: Vascular phase: Mild hyperemia to the LEFT knee compared to the RIGHT. Blood pool phase: Blood pool imaging demonstrates mild activity above and below the LEFT knee joint. Delayed phase: Delayed phase imaging demonstrates the greatest activity in the medial aspect of the LEFT tibial plateau. Mild uptake in the lateral tibial plateau and femoral condyles. IMPRESSION: Mild positivity on all 3 phases of bone scan with most intense activity on the delayed imaging in the medial aspect of the LEFT tibial plateau. Findings suggest early loosening. Electronically Signed   By: Suzy Bouchard M.D.   On: 04/20/2017 16:43    Disposition:  SNF  Discharge Instructions    Call MD / Call 911   Complete by:  As directed    If you experience chest pain or shortness of breath, CALL 911 and be transported to the hospital emergency room.  If you develope a fever above 101 F, pus (white drainage) or increased drainage or redness at the wound, or calf pain, call your surgeon's office.   Constipation Prevention    Complete by:  As directed    Drink plenty of fluids.  Prune juice may be helpful.  You may use a stool softener, such as Colace (over the counter) 100 mg twice a day.  Use MiraLax (over the counter) for constipation as needed.   Diet - low sodium heart healthy   Complete by:  As directed    Driving restrictions   Complete by:  As directed    No driving for 2 weeks   Increase activity slowly as tolerated   Complete by:  As directed    Patient may shower   Complete by:  As directed    You may shower without a dressing once there is no drainage.  Do not wash over the wound.  If drainage remains, cover wound with plastic wrap and then shower.      Follow-up Information    Frederik Pear, MD Follow up in 2 week(s).   Specialty:  Orthopedic Surgery Contact information: [place] [address] [phone]            Signed:  Joanell Rising  05/16/2017

## 2017-05-15 NOTE — NC FL2 (Signed)
[place] LEVEL OF CARE SCREENING TOOL     IDENTIFICATION  Patient Name: Leslie Martinez Birthdate: 22-Nov-1965 Sex: female Admission Date (Current Location): 05/12/2017  [facility] and [place] Number:  Herbalist and Address:  [facility] [place], [place] [address] [place], [place] [address]      Provider Number: [id number]  Attending Physician Name and Address:  Frederik Pear, MD  Relative Name and Phone Number:  Ollen Bowl, daughter, [phone]    Current Level of Care: Hospital Recommended Level of Care: [place] Prior Approval Number:    Date Approved/Denied:   PASRR Number: [id number]  Discharge Plan: SNF    Current Diagnoses: Patient Active Problem List   Diagnosis Date Noted  . Type 2 diabetes mellitus without complication ([place]) 00/93/8182  . AKI (acute kidney injury) ([place]) 05/14/2017  . Loosening of knee joint prosthesis ([place]) 05/11/2017  . Depression 07/23/2015  . Generalized anxiety disorder 07/23/2015  . Primary osteoarthritis of knee 07/13/2015  . Primary osteoarthritis of left knee 07/11/2015  . 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 Lap Nissen Nov 2013 02/06/2012  . Essential hypertension 01/09/2008  . 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 Asthma, chronic 01/09/2008  . BARRETT'S ESOPHAGUS, HX OF 01/09/2008  . NEPHROLITHIASIS, HX OF 01/09/2008  . DYSPHAGIA UNSPECIFIED 11/26/2007    Orientation RESPIRATION BLADDER Height & Weight     Self, Time, Situation, Place  Normal Continent Weight:   Height:     BEHAVIORAL SYMPTOMS/MOOD NEUROLOGICAL BOWEL NUTRITION STATUS      Continent Diet(See DC Summary)  AMBULATORY STATUS COMMUNICATION OF NEEDS Skin   Limited Assist Verbally Surgical wounds                       Personal Care Assistance Level of Assistance  Bathing, Feeding, Dressing Bathing Assistance: Limited assistance Feeding assistance: Independent Dressing  Assistance: Limited assistance     Functional Limitations Info  Sight, Hearing, Speech Sight Info: Adequate Hearing Info: Adequate Speech Info: Adequate    SPECIAL CARE FACTORS FREQUENCY  PT (By licensed PT), OT (By licensed OT)     PT Frequency: 5x week OT Frequency: 5x week            Contractures      Additional Factors Info  Code Status, Allergies Code Status Info: Full Allergies Info: ADHESIVE TAPE, IODINE, LISINOPRIL, CEPHALEXIN, COMPAZINE PROCHLORPERAZINE EDISYLATE, LATEX, OXYCODONE-ACETAMINOPHEN            Current Medications (05/15/2017):  This is the current hospital active medication list Current Facility-Administered Medications  Medication Dose Route Frequency Provider Last Rate Last Dose  . acetaminophen (TYLENOL) tablet 650 mg  650 mg Oral Q4H PRN Leighton Parody, PA-C       Or  . acetaminophen (TYLENOL) suppository 650 mg  650 mg Rectal Q4H PRN Leighton Parody, PA-C      . albuterol (PROVENTIL) (2.5 MG/3ML) 0.083% nebulizer solution 3 mL  3 mL Inhalation Q6H PRN Leighton Parody, PA-C      . alum & mag hydroxide-simeth (MAALOX/MYLANTA) 200-200-20 MG/5ML suspension 30 mL  30 mL Oral Q4H PRN Leighton Parody, PA-C      . aspirin EC tablet 325 mg  325 mg Oral Q breakfast Leighton Parody, PA-C   325 mg at 05/15/17 9937  . bisacodyl (DULCOLAX) EC tablet 5 mg  5 mg Oral Daily PRN Joanell Rising K, PA-C   5 mg at  05/14/17 2101  . diphenhydrAMINE (BENADRYL) 12.5 MG/5ML elixir 12.5-25 mg  12.5-25 mg Oral Q4H PRN Leighton Parody, PA-C   12.5 mg at 05/12/17 2104  . docusate sodium (COLACE) capsule 100 mg  100 mg Oral BID Leighton Parody, PA-C   100 mg at 05/15/17 1104  . gabapentin (NEURONTIN) capsule 300 mg  300 mg Oral TID Joanell Rising K, PA-C   300 mg at 05/15/17 1104  . HYDROmorphone (DILAUDID) injection 0.5 mg  0.5 mg Intravenous Q2H PRN Leighton Parody, PA-C   0.5 mg at 05/15/17 0809  . insulin aspart (novoLOG) injection 0-15 Units  0-15 Units  Subcutaneous TID WC Grier Mitts, PA-C   3 Units at 05/14/17 1652  . lipase/protease/amylase (CREON) capsule 36,000 Units  36,000 Units Oral Daily PRN Frederik Pear, MD      . lipase/protease/amylase (CREON) capsule 72,000 Units  72,000 Units Oral TID WC Leighton Parody, PA-C   72,000 Units at 05/15/17 0809  . loratadine (CLARITIN) tablet 10 mg  10 mg Oral Daily Leighton Parody, PA-C   10 mg at 05/15/17 1103  . Melatonin TABS 4.5 mg  4.5 mg Oral QHS Leighton Parody, PA-C   4.5 mg at 05/14/17 2101  . menthol-cetylpyridinium (CEPACOL) lozenge 3 mg  1 lozenge Oral PRN Leighton Parody, PA-C       Or  . phenol (CHLORASEPTIC) mouth spray 1 spray  1 spray Mouth/Throat PRN Leighton Parody, PA-C      . methocarbamol (ROBAXIN) tablet 500 mg  500 mg Oral Q6H PRN Leighton Parody, PA-C   500 mg at 05/15/17 0553   Or  . methocarbamol (ROBAXIN) 500 mg in dextrose 5 % 50 mL IVPB  500 mg Intravenous Q6H PRN Leighton Parody, PA-C      . metoCLOPramide (REGLAN) tablet 5-10 mg  5-10 mg Oral Q8H PRN Leighton Parody, PA-C       Or  . metoCLOPramide (REGLAN) injection 5-10 mg  5-10 mg Intravenous Q8H PRN Leighton Parody, PA-C      . ondansetron [facility]) tablet 4 mg  4 mg Oral Q6H PRN Leighton Parody, PA-C       Or  . ondansetron [facility]) injection 4 mg  4 mg Intravenous Q6H PRN Joanell Rising K, PA-C   4 mg at 05/13/17 0636  . oxyCODONE (Oxy IR/ROXICODONE) immediate release tablet 10 mg  10 mg Oral Q3H PRN Joanell Rising K, PA-C   10 mg at 05/15/17 1104  . oxyCODONE (Oxy IR/ROXICODONE) immediate release tablet 5 mg  5 mg Oral Q3H PRN Leighton Parody, PA-C   5 mg at 05/13/17 2023  . pantoprazole (PROTONIX) EC tablet 80 mg  80 mg Oral Daily Leighton Parody, PA-C   80 mg at 05/15/17 1104  . senna-docusate (Senokot-S) tablet 1 tablet  1 tablet Oral QHS PRN Leighton Parody, PA-C      . sertraline (ZOLOFT) tablet 100 mg  100 mg Oral Daily Leighton Parody, PA-C   100 mg at 05/15/17 1104  . sodium  phosphate (FLEET) 7-19 GM/118ML enema 1 enema  1 enema Rectal Once PRN Leighton Parody, PA-C         Discharge Medications: Please see discharge summary for a list of discharge medications.  Relevant Imaging Results:  Relevant Lab Results:   Additional Information SS#:[id number] [place], LCSW

## 2017-05-15 NOTE — Progress Notes (Signed)
PATIENT ID: Leslie Martinez  MRN: [id number]  DOB/AGE:  Aug 24, 1965 / 52 y.o.  3 Days Post-Op Procedure(s) (LRB): TOTAL KNEE REVISION (Left)    PROGRESS NOTE Subjective: Patient is alert, oriented, no Nausea, no Vomiting, yes passing gas. Taking PO well. Denies SOB, Chest or Calf Pain. Using Incentive Spirometer, PAS in place. Ambulate WBAT with pt walking 150 ft with therapy, Patient reports pain as moderate .    Objective: Vital signs in last 24 hours: Vitals:   05/14/17 0641 05/14/17 1444 05/14/17 2107 05/15/17 0648  BP: (!) 106/58 (!) 100/54 (!) 132/56 132/77  Pulse: (!) 57 (!) 51 (!) 56 62  Resp: 16 16 16 16   Temp: 98 F (36.7 C) 97.9 F (36.6 C) 98.2 F (36.8 C) 98.2 F (36.8 C)  TempSrc: Oral Oral Oral Oral  SpO2: 92% 99% 100% 97%      Intake/Output from previous day: I/O last 3 completed shifts: In: 4970 [P.O.:720; I.V.:1110] Out: 4450 [Urine:4450]   Intake/Output this shift: No intake/output data recorded.   LABORATORY DATA: Recent Labs    05/14/17 0935  05/14/17 1624 05/14/17 2109 05/15/17 0619 05/15/17 0652  WBC 10.6*  --   --   --  6.2  --   HGB 9.7*  --   --   --  9.7*  --   HCT 31.9*  --   --   --  31.9*  --   PLT 145*  --   --   --  145*  --   NA 138  --   --   --  140  --   K 4.4  --   --   --  4.3  --   CL 106  --   --   --  103  --   CO2 24  --   --   --  28  --   BUN 18  --   --   --  14  --   CREATININE 1.22*  --   --   --  0.98  --   GLUCOSE 186*  --   --   --  103*  --   GLUCAP  --    < > 170* 127*  --  87  CALCIUM 9.1  --   --   --  9.0  --    < > = values in this interval not displayed.    Examination: Neurologically intact Neurovascular intact Sensation intact distally Intact pulses distally Dorsiflexion/Plantar flexion intact Incision: dressing C/D/I and no drainage No cellulitis present Compartment soft}  Assessment:   3 Days Post-Op Procedure(s) (LRB): TOTAL KNEE REVISION (Left) ADDITIONAL DIAGNOSIS: Expected Acute Blood  Loss Anemia, Diabetes, Hypertension and Renal Insufficiency Acute  Plan: PT/OT WBAT, AROM and PROM  DVT Prophylaxis:  SCDx72hrs, ASA 325 mg BID x 2 weeks DISCHARGE PLAN: Skilled Nursing Facility/Rehab when pt able to void on own DISCHARGE NEEDS: HHPT, Walker and 3-in-1 comode seat     Joanell Rising 05/15/2017, 12:10 PM

## 2017-05-15 NOTE — Social Work (Signed)
CSW met with patient at bedside and discussed SNF offer from [facility]. Pt accepted SNF bed offers. CSW advised patient that [facility] indicated that patient will have a 20% co-pay and patient voiced agreement and knowledge of same. CSW will provide transport to SNF at appropriate time.  CSW contacted SNF to confirm bed offer and for them to [employer] for placement.  CSW will continue to follow.  Elissa Hefty, LCSW Clinical Social Worker [phone]

## 2017-05-15 NOTE — Progress Notes (Signed)
Occupational Therapy Treatment Patient Details Name: Leslie Martinez MRN: [id number] DOB: 09-07-1965 Today's Date: 05/15/2017    History of present illness Pt is a 52 y/o female s/p L total knee revision. PMH inlucdes HTN, sleep apnea, pre-diabetes, and L TKA.    OT comments  Pt demonstrating progress toward OT goals. She was up in bathroom finishing a shower on OT arrival. She was able to complete LB dressing tasks with min assist from nurse tech and functional mobility in bathroom with RW with supervision this session. She reports 5/10 pain and was able to tolerate completion of multiple consecutive standing grooming tasks this session. OT will continue to follow while admitted. Pt reports plan to D/C to SNF.    Follow Up Recommendations  DC plan and follow up therapy as arranged by surgeon    Equipment Recommendations  3 in 1 bedside commode    Recommendations for Other Services      Precautions / Restrictions Precautions Precautions: Knee Restrictions Weight Bearing Restrictions: Yes LLE Weight Bearing: Weight bearing as tolerated       Mobility Bed Mobility               General bed mobility comments: OOB in bathroom on my arrival  Transfers Overall transfer level: Needs assistance Equipment used: Rolling walker (2 wheeled) Transfers: Sit to/from Stand Sit to Stand: Supervision         General transfer comment: supervision for safety    Balance Overall balance assessment: Needs assistance Sitting-balance support: No upper extremity supported;Feet supported Sitting balance-[name]: Good     Standing balance support: Bilateral upper extremity supported;During functional activity Standing balance-[name]: Fair Standing balance comment: Able to statically stand at sink while completing grooming tasks with supervision.                            ADL either performed or assessed with clinical judgement   ADL Overall ADL's : Needs  assistance/impaired Eating/Feeding: Set up;Sitting   Grooming: Supervision/safety;Standing               Lower Body Dressing: Minimal assistance(standing)   Toilet Transfer: Supervision/safety;RW           Functional mobility during ADLs: Supervision/safety;Rolling walker General ADL Comments: Pt up in bathroom finishing shower on my arrival. Required min assist from nurse tech to don underwear in standing position.      Vision       Perception     Praxis      Cognition Arousal/Alertness: Awake/alert Behavior During Therapy: WFL for tasks assessed/performed Overall Cognitive Status: Within Functional Limits for tasks assessed                                          Exercises     Shoulder Instructions       General Comments mother present for session    Pertinent Vitals/ Pain       Pain Assessment: 0-10 Pain Score: 5  Pain Location: L knee  Pain Descriptors / Indicators: Sore;Grimacing;Guarding Pain Intervention(s): Monitored during session;Repositioned  Home Living                                          Prior Functioning/Environment  Frequency  Min 2X/week        Progress Toward Goals  OT Goals(current goals can now be found in the care plan section)  Progress towards OT goals: Progressing toward goals  Acute Rehab OT Goals Patient Stated Goal: go to rehab and return home independently OT Goal Formulation: With patient/family Time For Goal Achievement: 05/27/17 Potential to Achieve Goals: Good  Plan Discharge plan remains appropriate    Co-evaluation                 AM-PAC PT "6 Clicks" Daily Activity     Outcome Measure   Help from another person eating meals?: None Help from another person taking care of personal grooming?: A Little Help from another person toileting, which includes using toliet, bedpan, or urinal?: A Little Help from another person bathing (including  washing, rinsing, drying)?: A Little Help from another person to put on and taking off regular upper body clothing?: None Help from another person to put on and taking off regular lower body clothing?: A Little 6 Click Score: 20    End of Session    OT Visit Diagnosis: Unsteadiness on feet (R26.81);Other abnormalities of gait and mobility (R26.89);Pain Pain - Right/Left: Left Pain - part of body: Knee   Activity Tolerance     Patient Left     Nurse Communication          Time: 3151-7616 OT Time Calculation (min): 11 min  Charges: OT General Charges $OT Visit: 1 Visit OT Treatments $Self Care/Home Management : 8-22 mins  Norman Herrlich, MS OTR/L  Pager: [place] A Anajulia Leyendecker 05/15/2017, 1:28 PM

## 2017-05-15 NOTE — Progress Notes (Signed)
Lm w/office that pt would not be d/c today due to insurance approval still pending.

## 2017-05-15 NOTE — Social Work (Signed)
CSW contacted SNF to f/u on Insurance Auth and [facility] confirmed that they have not received Insurance Auth for SNF placement.  CSW will advise clinical staff.  Pt will discharge to SNF tomorrow morning.  Elissa Hefty, LCSW Clinical Social Worker [phone]

## 2017-05-15 NOTE — Care Management Note (Signed)
Case Management Note  Patient Details  Name: Leslie Martinez MRN: [id number] Date of Birth: January 08, 1966  Subjective/Objective:     52 yr old female s/p left total knee revision.                Action/Plan: Case manager spoke with patient and her mom concerning discharge plan. Patient expressed interest in going to inpatient rehab. Case manager explained to patient that she likely will not qualify, this was confirmed by Leslie Martinez, Rehab admission CoOrdinator. Social worker has seen patient for SNF placement. No further Case Management.                                                                                                                                                                                                                                                                                        Expected Discharge Date:  05/15/17               Expected Discharge Plan:  Skilled Nursing Facility  In-House Referral:  Clinical Social Work  Discharge planning Services  CM Consult  Post Acute Care Choice:  NA Choice offered to:  NA  DME Arranged:  N/A DME Agency:  NA  HH Arranged:  NA HH Agency:  NA  Status of Service:  Completed, signed off  If discussed at [employer] of Stay Meetings, dates discussed:    Additional Comments:  Ninfa Meeker, RN 05/15/2017, 2:26 PM

## 2017-05-15 NOTE — Plan of Care (Signed)
  Progressing Education: Knowledge of General Education information will improve 05/15/2017 0758 - Progressing by Milderd Meager, RN Health Behavior/Discharge Planning: Ability to manage health-related needs will improve 05/15/2017 0758 - Progressing by Milderd Meager, RN Clinical Measurements: Ability to maintain clinical measurements within normal limits will improve 05/15/2017 0758 - Progressing by Milderd Meager, RN Will remain free from infection 05/15/2017 0758 - Progressing by Milderd Meager, RN Diagnostic test results will improve 05/15/2017 0758 - Progressing by Milderd Meager, RN Respiratory complications will improve 05/15/2017 0758 - Progressing by Milderd Meager, RN Cardiovascular complication will be avoided 05/15/2017 0758 - Progressing by Milderd Meager, RN Activity: Risk for activity intolerance will decrease 05/15/2017 0758 - Progressing by Milderd Meager, RN Elimination: Will not experience complications related to bowel motility 05/15/2017 0758 - Progressing by Milderd Meager, RN Will not experience complications related to urinary retention 05/15/2017 0758 - Progressing by Milderd Meager, RN Pain Managment: General experience of comfort will improve 05/15/2017 0758 - Progressing by Milderd Meager, RN Safety: Ability to remain free from injury will improve 05/15/2017 0758 - Progressing by Milderd Meager, RN Skin Integrity: Risk for impaired skin integrity will decrease 05/15/2017 0758 - Progressing by Milderd Meager, RN Education: Knowledge of the prescribed therapeutic regimen will improve 05/15/2017 0758 - Progressing by Milderd Meager, RN Activity: Ability to avoid complications of mobility impairment will improve 05/15/2017 0758 - Progressing by Milderd Meager, RN Range of joint motion will improve 05/15/2017 0758 - Progressing by Milderd Meager, RN Clinical Measurements: Postoperative complications  will be avoided or minimized 05/15/2017 0758 - Progressing by Milderd Meager, RN Pain Management: Pain level will decrease with appropriate interventions 05/15/2017 0758 - Progressing by Milderd Meager, RN Skin Integrity: Signs of wound healing will improve 05/15/2017 0758 - Progressing by Milderd Meager, RN

## 2017-05-15 NOTE — Progress Notes (Signed)
Rehab Admissions Coordinator Note:  Patient was screened by Cleatrice Burke for appropriateness for an Inpatient Acute Rehab Consult.  At this time, we are recommending [facility]. Pt with single joint replacement and is min guard assist 150 feet. Recommend SNF. I previously spoke with pt by phone pta and did explain to her then my recommendations. Please let me know if you have any questions.  Cleatrice Burke 05/15/2017, 11:24 AM  I can be reached at [phone].

## 2017-05-15 NOTE — Clinical Social Work Note (Signed)
Clinical Social Work Assessment  Patient Details  Name: Leslie Martinez MRN: [id number] Date of Birth: [birth year]  Date of referral:  05/15/17               Reason for consult:  Facility Placement                Permission sought to share information with:  Facility Art therapist granted to share information::  Yes, Verbal Permission Granted  Name::     Daughter's  Agency::  [facility]  Relationship::     Contact Information:     Housing/Transportation Living arrangements for the past 2 months:  Single Family Home Source of Information:  Patient Patient Interpreter Needed:  None Criminal Activity/Legal Involvement Pertinent to Current Situation/Hospitalization:  No - Comment as needed Significant Relationships:  Adult Children, Parents, Other Family Members Lives with:  Self Do you feel safe going back to the place where you live?  No Need for family participation in patient care:  No (Coment)  Care giving concerns:  Pt has new impairment and resides alone. Pt indicated that when she had her left knee repair prior she went to [facility].  CSW discussed recommendations from clinical team and patient in agreement as she has no one to help at home. Pt indicated that she would prefer [facility]hab, and if not then she will take [facility] or [facility]. CSW agreed an explained SNF process/placement. CSW advised that SNF will need to obtain Insurance Auth for placement. CSW obtained permission to send to SNF.  Social Worker assessment / plan:  CSW will f/u for SNF offers and discuss with patient.  CSW will then complete disposition.  Employment status:  Unemployed Insurance information:  Other (Comment Required)([employer]-[place]) PT Recommendations:  [place] / Referral to community resources:  [place]  Patient/Family's Response to care:  Patient appreciative of CSW meeting to discuss SNF options. Pt agreeable to  plan for SNF.  Patient/Family's Understanding of and Emotional Response to Diagnosis, Current Treatment, and Prognosis:  Pt has been to SNF in the past for Right knee and agreeable as she understands that she has no help at home. Her mom at bedside and indicated that she lives 2 hours away and cannot be readily available. Pt agreeable to SNF and hopes to return home soon once treatment completed. Pt aware of Insurance Auth that is needed for SNF rehab as CSW advised of same, per SNF's. CSW will assist with disposition.  Emotional Assessment Appearance:  Appears stated age Attitude/Demeanor/Rapport:  (Cooperative) Affect (typically observed):  Accepting, Appropriate Orientation:  Oriented to Situation, Oriented to  Time, Oriented to Place, Oriented to Self Alcohol / Substance use:  Not Applicable Psych involvement (Current and /or in the community):  No (Comment)  Discharge Needs  Concerns to be addressed:  Discharge Planning Concerns Readmission within the last 30 days:  No Current discharge risk:  Dependent with Mobility, Lives alone Barriers to Discharge:  No Barriers Identified   Normajean Baxter, LCSW 05/15/2017, 11:38 AM

## 2017-05-15 NOTE — Progress Notes (Addendum)
PROGRESS NOTE  [name] XIP:[id number] DOB: 1966/01/16 DOA: 05/12/2017 PCP: Fanny Bien, MD  HPI/Recap of past 24 hours:  Leslie Martinez is a 52 y.o. year old female with medical history significant for left knee OA, GERD, history of esophageal stricture status post Nissen fundoplication, hypertension who presented on 05/12/2017 for planned orthopedic surgery of loose left  total knee arthroplasty and medicine was consulted for new acute kidney insufficiency postop day 1.      This morning has no complaints.  Some mild leg pain but otherwise no acute events overnight  Assessment/Plan: Principal Problem:   Loosening of knee joint prosthesis (HCC) Active Problems:   Essential hypertension   Primary osteoarthritis of left knee   AKI (acute kidney injury) ([place])   Type 2 diabetes mellitus without complication (HCC)   AKI, suspect prerenal etiology, resolved Creatinine back to baseline. Likely related to hypotension closely after procedure and initiation of home BP meds.  Less concern for obstruction. UA unremarkalbe. FeUrea 66% - discontinue foley, monitor for void -advise holding home ARB and HCTZ until seen by PCP after discharge given BP normotensive currently   Hypertension,at goal At goal while holding BP meds.  Did have softer BP shortly after procedure while initiating home medications - advise to hold BP on discharge (unless becomes hypertensive before) with plan to resume slowly by PCP   Type 2 diabetes, controlled A1c 6.8. Has been on metformin before and had some abdominal cramps. Victoza not an option with history of pancreatitis ( on creon for this).  - On discharge recommend starting low dose given history ( patient ok with this) 500 mg qd, can uptitrate with PCP as outpatient  - agree with sliding scale while in hospital   Code Status: Full code Family Communication: No family at bedside  Disposition Plan: ok from a medicine standpoint for discharge. Thank  you for this consult. We are signing off.   Objective: Vitals:   05/14/17 0641 05/14/17 1444 05/14/17 2107 05/15/17 0648  BP: (!) 106/58 (!) 100/54 (!) 132/56 132/77  Pulse: (!) 57 (!) 51 (!) 56 62  Resp: 16 16 16 16   Temp: 98 F (36.7 C) 97.9 F (36.6 C) 98.2 F (36.8 C) 98.2 F (36.8 C)  TempSrc: Oral Oral Oral Oral  SpO2: 92% 99% 100% 97%    Intake/Output Summary (Last 24 hours) at 05/15/2017 0948 Last data filed at 05/15/2017 0700 Gross per 24 hour  Intake 720 ml  Output 2425 ml  Net -1705 ml   There were no vitals filed for this visit.  Exam:  General-in bedside chair, no acute distress Cardiovascular-regular rate and rhythm, no appreciable murmurs Respiratory-on room air, clear breath sounds bilaterally Extremities: Left leg postoperative dressing in place C/D/I Neuro-alert and oriented x4.  Data Reviewed: CBC: Recent Labs  Lab 05/09/17 1240 05/13/17 0610 05/14/17 0935 05/15/17 0619  WBC 3.5* 7.9 10.6* 6.2  NEUTROABS 1.1*  --   --   --   HGB 12.2 11.1* 9.7* 9.7*  HCT 37.7 35.7* 31.9* 31.9*  MCV 83.6 86.4 86.4 86.7  PLT 167 170 145* 673*   Basic Metabolic Panel: Recent Labs  Lab 05/09/17 1240 05/13/17 0610 05/13/17 1618 05/14/17 0935 05/15/17 0619  NA 140 135 133* 138 140  K 3.6 4.7 4.6 4.4 4.3  CL 107 100* 102 106 103  CO2 24 26 22 24 28   GLUCOSE 119* 144* 240* 186* 103*  BUN 7 15 23* 18 14  CREATININE 0.77  1.87* 1.97* 1.22* 0.98  CALCIUM 9.2 8.8* 8.8* 9.1 9.0   GFR: Estimated Creatinine Clearance: 91.2 mL/min (by C-G formula based on SCr of 0.98 mg/dL). Liver Function Tests: No results for input(s): AST, ALT, ALKPHOS, BILITOT, PROT, ALBUMIN in the last 168 hours. No results for input(s): LIPASE, AMYLASE in the last 168 hours. No results for input(s): AMMONIA in the last 168 hours. Coagulation Profile: Recent Labs  Lab 05/09/17 1240  INR 1.04   Cardiac Enzymes: No results for input(s): CKTOTAL, CKMB, CKMBINDEX, TROPONINI in the last  168 hours. BNP (last 3 results) No results for input(s): PROBNP in the last 8760 hours. HbA1C: Recent Labs    05/14/17 1235  HGBA1C 6.8*   CBG: Recent Labs  Lab 05/14/17 0645 05/14/17 1205 05/14/17 1624 05/14/17 2109 05/15/17 0652  GLUCAP 142* 106* 170* 127* 87   Lipid Profile: No results for input(s): CHOL, HDL, LDLCALC, TRIG, CHOLHDL, LDLDIRECT in the last 72 hours. Thyroid Function Tests: No results for input(s): TSH, T4TOTAL, FREET4, T3FREE, THYROIDAB in the last 72 hours. Anemia Panel: No results for input(s): VITAMINB12, FOLATE, FERRITIN, TIBC, IRON, RETICCTPCT in the last 72 hours. Urine analysis:    Component Value Date/Time   COLORURINE STRAW (A) 05/13/2017 1957   APPEARANCEUR CLEAR 05/13/2017 1957   LABSPEC 1.007 05/13/2017 1957   PHURINE 5.0 05/13/2017 1957   GLUCOSEU >=500 (A) 05/13/2017 1957   HGBUR NEGATIVE 05/13/2017 1957   BILIRUBINUR NEGATIVE 05/13/2017 1957   KETONESUR NEGATIVE 05/13/2017 1957   PROTEINUR NEGATIVE 05/13/2017 1957   UROBILINOGEN 0.2 11/02/2011 2057   NITRITE NEGATIVE 05/13/2017 1957   LEUKOCYTESUR NEGATIVE 05/13/2017 1957   Sepsis Labs: @LABRCNTIP (procalcitonin:4,lacticidven:4)  ) Recent Results (from the past 240 hour(s))  Surgical pcr screen     Status: None   Collection Time: 05/09/17 12:40 PM  Result Value Ref Range Status   MRSA, PCR NEGATIVE NEGATIVE Final   Staphylococcus aureus NEGATIVE NEGATIVE Final    Comment: (NOTE) The Xpert SA Assay (FDA approved for NASAL specimens in patients 64 years of age and older), is one component of a comprehensive surveillance program. It is not intended to diagnose infection nor to guide or monitor treatment. Performed at [facility], [place] [address], [place], [place] [address]   Aerobic/Anaerobic Culture (surgical/deep wound)     Status: None (Preliminary result)   Collection Time: 05/12/17  8:11 AM  Result Value Ref Range Status   Specimen Description SYNOVIAL LEFT KNEE   Final   Special Requests NONE  Final   Gram Stain   Final    RARE WBC PRESENT, PREDOMINANTLY MONONUCLEAR NO ORGANISMS SEEN Gram Stain Report Called to,Read Back By and Verified With: Anise Salvo AT 2993 05/12/17 BY L BENFIELD    Culture   Final    NO GROWTH 2 DAYS NO ANAEROBES ISOLATED; CULTURE IN PROGRESS FOR 5 DAYS Performed at [facility], [place] [address] [place], [place] [address]    Report Status PENDING  Incomplete      Studies: No results found.  Scheduled Meds: . aspirin EC  325 mg Oral Q breakfast  . docusate sodium  100 mg Oral BID  . gabapentin  300 mg Oral TID  . insulin aspart  0-15 Units Subcutaneous TID WC  . lipase/protease/amylase  72,000 Units Oral TID WC  . loratadine  10 mg Oral Daily  . Melatonin  4.5 mg Oral QHS  . pantoprazole  80 mg Oral Daily  . sertraline  100 mg Oral  Daily    Continuous Infusions: . methocarbamol (ROBAXIN)  IV       LOS: 3 days     [name], MD [facility] Pager [phone]  If 7PM-7AM, please contact night-coverage [email or web address] Password [facility] 05/15/2017, 9:48 AM

## 2017-05-15 NOTE — Progress Notes (Signed)
Physical Therapy Treatment Patient Details Name: Leslie Martinez MRN: [id number] DOB: [birth year] Today's Date: 05/15/2017    History of Present Illness Pt is a 52 y/o female s/p L total knee revision. PMH inlucdes HTN, sleep apnea, pre-diabetes, and L TKA.     PT Comments    Patient is making progress toward mobility goals. Pt continues to rely heavily on RW when ambulating and reported increased pain today vs previous session. Continue to progress as tolerated with anticipated d/c to SNF for further skilled PT services.     Follow Up Recommendations  DC plan and follow up therapy as arranged by surgeon;Supervision for mobility/OOB     Equipment Recommendations  None recommended by PT    Recommendations for Other Services       Precautions / Restrictions Precautions Precautions: Knee Precaution Booklet Issued: Yes (comment) Precaution Comments: Reviewed no pillow under knee. Restrictions Weight Bearing Restrictions: Yes LLE Weight Bearing: Weight bearing as tolerated    Mobility  Bed Mobility Overal bed mobility: Needs Assistance Bed Mobility: Sit to Supine       Sit to supine: Min assist   General bed mobility comments: assist to bring L Le into bed  Transfers Overall transfer level: Needs assistance Equipment used: Rolling walker (2 wheeled) Transfers: Sit to/from Stand Sit to Stand: Supervision         General transfer comment: supervision for safety  Ambulation/Gait Ambulation/Gait assistance: Min guard Ambulation Distance (Feet): 100 Feet Assistive device: Rolling walker (2 wheeled) Gait Pattern/deviations: Step-through pattern;Decreased weight shift to left;Decreased stance time - left;Decreased step length - right;Decreased step length - left Gait velocity: Decreased    General Gait Details: cues for posture and L heel strike/toe off; pt with heavy reliance on RW   Stairs            Wheelchair Mobility    Modified Rankin (Stroke Patients  Only)       Balance Overall balance assessment: Needs assistance Sitting-balance support: No upper extremity supported;Feet supported Sitting balance-[name]: Good     Standing balance support: Bilateral upper extremity supported;During functional activity Standing balance-[name]: Fair Standing balance comment: Able to statically stand at sink while completing grooming tasks with supervision.                             Cognition Arousal/Alertness: Awake/alert Behavior During Therapy: WFL for tasks assessed/performed Overall Cognitive Status: Within Functional Limits for tasks assessed                                        Exercises Total Joint Exercises Quad Sets: AROM;Left;10 reps Heel Slides: AROM;Left;10 reps Hip ABduction/ADduction: AROM;Left;10 reps Straight Leg Raises: AAROM;Left;10 reps Long Arc Quad: AROM;Left;10 reps;Seated;AAROM Knee Flexion: AROM;Left;5 reps;Seated;Other (comment)(10 sec holds) Goniometric ROM: 90 degrees flexion    General Comments General comments (skin integrity, edema, etc.): mother present for session      Pertinent Vitals/Pain Pain Assessment: 0-10 Pain Score: 5  Pain Location: L knee  Pain Descriptors / Indicators: Sore;Grimacing;Guarding Pain Intervention(s): Limited activity within patient's tolerance;Monitored during session;Repositioned;RN gave pain meds during session;Ice applied    Home Living                      Prior Function            PT Goals (  current goals can now be found in the care plan section) Acute Rehab PT Goals Patient Stated Goal: go to rehab and return home independently PT Goal Formulation: With patient Time For Goal Achievement: 05/26/17 Potential to Achieve Goals: Good Progress towards PT goals: Progressing toward goals    Frequency    7X/week      PT Plan Current plan remains appropriate    Co-evaluation              AM-PAC PT "6  Clicks" Daily Activity  Outcome Measure  Difficulty turning over in bed (including adjusting bedclothes, sheets and blankets)?: A Little Difficulty moving from lying on back to sitting on the side of the bed? : Unable Difficulty sitting down on and standing up from a chair with arms (e.g., wheelchair, bedside commode, etc,.)?: A Lot Help needed moving to and from a bed to chair (including a wheelchair)?: A Little Help needed walking in hospital room?: A Little Help needed climbing 3-5 steps with a railing? : A Little 6 Click Score: 15    End of Session Equipment Utilized During Treatment: Gait belt Activity Tolerance: Patient tolerated treatment well Patient left: with call bell/phone within reach;with family/visitor present;in chair Nurse Communication: Mobility status PT Visit Diagnosis: Other abnormalities of gait and mobility (R26.89);Pain Pain - Right/Left: Left Pain - part of body: Knee     Time: 3888-2800 PT Time Calculation (min) (ACUTE ONLY): 30 min  Charges:  $Gait Training: 8-22 mins $Therapeutic Exercise: 8-22 mins                    G Codes:       Earney Navy, PTA Pager: [phone]     Darliss Cheney 05/15/2017, 4:25 PM

## 2017-05-16 ENCOUNTER — Encounter (HOSPITAL_COMMUNITY): Payer: Self-pay | Admitting: Orthopedic Surgery

## 2017-05-16 LAB — GLUCOSE, CAPILLARY
GLUCOSE-CAPILLARY: 122 mg/dL — AB (ref 65–99)
GLUCOSE-CAPILLARY: 87 mg/dL (ref 65–99)

## 2017-05-16 NOTE — Progress Notes (Signed)
Removed IV, provided discharge education/instructions, all questions and concerns addressed, Pt not in distress, discharged to SNF with belongings accompanied by daughter. [facility] and gave report to nurse Ngozi.

## 2017-05-16 NOTE — Plan of Care (Signed)
  Activity: Risk for activity intolerance will decrease 05/16/2017 1233 - Progressing by Williams Che, RN   Pain Managment: General experience of comfort will improve 05/16/2017 1233 - Progressing by Williams Che, RN   Safety: Ability to remain free from injury will improve 05/16/2017 1233 - Progressing by Williams Che, RN

## 2017-05-16 NOTE — Progress Notes (Signed)
Physical Therapy Treatment Patient Details Name: Leslie Martinez MRN: [id number] DOB: Sep 15, 1965 Today's Date: 05/16/2017    History of Present Illness Pt is a 52 y/o female s/p L total knee revision. PMH inlucdes HTN, sleep apnea, pre-diabetes, and L TKA.     PT Comments    Today's skilled session focused on HEP and gait training. Educated pt on importance of performing HEP 3x/day and using bone foam to encourage knee extension. SNF at d/c continues to be appropriate as pt has no assist or supervision at home. Will continue to follow acutely.     Follow Up Recommendations  DC plan and follow up therapy as arranged by surgeon;Supervision for mobility/OOB     Equipment Recommendations  None recommended by PT    Recommendations for Other Services       Precautions / Restrictions Precautions Precautions: Knee Precaution Booklet Issued: Yes (comment) Precaution Comments: Reviewed no pillow under knee. Restrictions Weight Bearing Restrictions: Yes LLE Weight Bearing: Weight bearing as tolerated    Mobility  Bed Mobility               General bed mobility comments: in chair on arrival  Transfers Overall transfer level: Needs assistance Equipment used: Rolling walker (2 wheeled) Transfers: Sit to/from Stand Sit to Stand: Supervision         General transfer comment: supervision for safety. Good technique when rising from recliner  Ambulation/Gait Ambulation/Gait assistance: Min guard Ambulation Distance (Feet): 100 Feet Assistive device: Rolling walker (2 wheeled) Gait Pattern/deviations: Step-through pattern;Decreased weight shift to left;Decreased stance time - left;Decreased step length - right;Decreased step length - left;Antalgic Gait velocity: Decreased  Gait velocity interpretation: Below normal speed for age/gender General Gait Details: Pt with slightly antalgic gait pattern and decreased heel strike on BIL LEs. Heavy reliance on RW. Cues for increased heel  strike and knee flexion.   Stairs            Wheelchair Mobility    Modified Rankin (Stroke Patients Only)       Balance Overall balance assessment: Needs assistance Sitting-balance support: No upper extremity supported;Feet supported Sitting balance-[name]: Good     Standing balance support: Bilateral upper extremity supported;During functional activity Standing balance-[name]: Fair                              Cognition Arousal/Alertness: Awake/alert Behavior During Therapy: WFL for tasks assessed/performed Overall Cognitive Status: Within Functional Limits for tasks assessed                                        Exercises Total Joint Exercises Heel Slides: AROM;Left;10 reps;Seated(towel under foot) Long Arc Quad: AAROM;Left;10 reps;Seated Knee Flexion: AROM;Left;5 reps;Seated;Other (comment)(10 sec holds)    General Comments        Pertinent Vitals/Pain Pain Assessment: 0-10 Faces Pain Scale: Hurts little more Pain Location: L knee  Pain Descriptors / Indicators: Sore;Grimacing;Guarding Pain Intervention(s): Monitored during session;Limited activity within patient's tolerance;Patient requesting pain meds-RN notified;RN gave pain meds during session;Repositioned    Home Living                      Prior Function            PT Goals (current goals can now be found in the care plan section) Acute Rehab PT Goals  Patient Stated Goal: go to rehab and return home independently PT Goal Formulation: With patient Time For Goal Achievement: 05/26/17 Potential to Achieve Goals: Good Progress towards PT goals: Progressing toward goals    Frequency    7X/week      PT Plan Current plan remains appropriate    Co-evaluation              AM-PAC PT "6 Clicks" Daily Activity  Outcome Measure  Difficulty turning over in bed (including adjusting bedclothes, sheets and blankets)?: A Little Difficulty  moving from lying on back to sitting on the side of the bed? : Unable Difficulty sitting down on and standing up from a chair with arms (e.g., wheelchair, bedside commode, etc,.)?: A Little Help needed moving to and from a bed to chair (including a wheelchair)?: A Little Help needed walking in hospital room?: A Little Help needed climbing 3-5 steps with a railing? : A Little 6 Click Score: 16    End of Session Equipment Utilized During Treatment: Gait belt Activity Tolerance: Patient tolerated treatment well Patient left: with call bell/phone within reach;with family/visitor present;in chair Nurse Communication: Mobility status PT Visit Diagnosis: Other abnormalities of gait and mobility (R26.89);Pain Pain - Right/Left: Left Pain - part of body: Knee     Time: 9794-8016 PT Time Calculation (min) (ACUTE ONLY): 19 min  Charges:  $Gait Training: 8-22 mins                    G Codes:       Benjiman Core, [place] Pager [phone] Acute Rehab  Allena Katz 05/16/2017, 10:59 AM

## 2017-05-16 NOTE — Progress Notes (Addendum)
PATIENT ID: Leslie Martinez  MRN: [id number]  DOB/AGE:  January 11, 1966 / 52 y.o.  4 Days Post-Op Procedure(s) (LRB): TOTAL KNEE REVISION (Left)    PROGRESS NOTE Subjective: Patient is alert, oriented, no Nausea, no Vomiting, yes passing gas. Taking PO well. Denies SOB, Chest or Calf Pain. Using Incentive Spirometer, PAS in place. Ambulate 100', Patient reports pain as 5/10 .    Objective: Vital signs in last 24 hours: Vitals:   05/14/17 2107 05/15/17 0648 05/15/17 2151 05/16/17 0426  BP: (!) 132/56 132/77 127/61 (!) 146/69  Pulse: (!) 56 62 77 65  Resp: 16 16 18 17   Temp: 98.2 F (36.8 C) 98.2 F (36.8 C) 98.7 F (37.1 C) 98.4 F (36.9 C)  TempSrc: Oral Oral Oral Oral  SpO2: 100% 97% 99% 100%      Intake/Output from previous day: I/O last 3 completed shifts: In: 596 [P.O.:596] Out: 1575 [Urine:1575]   Intake/Output this shift: No intake/output data recorded.   LABORATORY DATA: Recent Labs    05/14/17 0935  05/15/17 0619  05/15/17 1605 05/15/17 2127 05/16/17 0539  WBC 10.6*  --  6.2  --   --   --   --   HGB 9.7*  --  9.7*  --   --   --   --   HCT 31.9*  --  31.9*  --   --   --   --   PLT 145*  --  145*  --   --   --   --   NA 138  --  140  --   --   --   --   K 4.4  --  4.3  --   --   --   --   CL 106  --  103  --   --   --   --   CO2 24  --  28  --   --   --   --   BUN 18  --  14  --   --   --   --   CREATININE 1.22*  --  0.98  --   --   --   --   GLUCOSE 186*  --  103*  --   --   --   --   GLUCAP  --    < >  --    < > 96 157* 122*  CALCIUM 9.1  --  9.0  --   --   --   --    < > = values in this interval not displayed.    Examination: Neurologically intact ABD soft Neurovascular intact Sensation intact distally Intact pulses distally Dorsiflexion/Plantar flexion intact Incision: scant drainage No cellulitis present Compartment soft}  Assessment:   4 Days Post-Op Procedure(s) (LRB): TOTAL KNEE REVISION (Left) ADDITIONAL DIAGNOSIS: Expected Acute Blood  Loss Anemia, asthma, AKI, hx depression, HTN, Diabetes.  Plan: PT/OT WBAT, AROM and PROM  DVT Prophylaxis:  SCDx72hrs, ASA 325 mg BID x 2 weeks DISCHARGE PLAN: Skilled Nursing Facility/Rehab,Pending approval from insurance carrier, hopefully today [place] [facility] DISCHARGE NEEDS: HHPT, Walker and 3-in-1 comode seat     Kerin Salen 05/16/2017, 7:35 AM

## 2017-05-16 NOTE — Progress Notes (Signed)
Patient will discharge to [facility] Anticipated discharge date: 2/12 Family notified: pt dtr Transportation by pt dtr Report #[phone] room [place] signing off.  Jorge Ny, LCSW Clinical Social Worker [phone]

## 2017-05-16 NOTE — Clinical Social Work Placement (Signed)
   CLINICAL SOCIAL WORK PLACEMENT  NOTE  Date:  05/16/2017  Patient Details  Name: Leslie Martinez MRN: [id number] Date of Birth: 1966/04/01  Clinical Social Work is seeking post-discharge placement for this patient at the [place] level of care (*CSW will initial, date and re-position this form in  chart as items are completed):  Yes   Patient/family provided with [facility] Work Department's list of facilities offering this level of care within the geographic area requested by the patient (or if unable, by the patient's family).  Yes   Patient/family informed of their freedom to choose among providers that offer the needed level of care, that participate in Medicare, Medicaid or managed care program needed by the patient, have an available bed and are willing to accept the patient.  Yes   Patient/family informed of [facility]'s ownership interest in [facility] and [facility], as well as of the fact that they are under no obligation to receive care at these facilities.  PASRR submitted to EDS on       PASRR number received on       Existing PASRR number confirmed on 05/15/17     FL2 transmitted to all facilities in geographic area requested by pt/family on       FL2 transmitted to all facilities within larger geographic area on 05/15/17     Patient informed that his/her managed care company has contracts with or will negotiate with certain facilities, including the following:        Yes   Patient/family informed of bed offers received.  Patient chooses bed at [facility]     Physician recommends and patient chooses bed at      Patient to be transferred to [facility] on  .  Patient to be transferred to facility by dtr     Patient family notified on   of transfer.  Name of family member notified:  pt responsible for self     PHYSICIAN       Additional Comment:    _______________________________________________ Jorge Ny, LCSW 05/16/2017, 3:05 PM

## 2017-05-17 LAB — AEROBIC/ANAEROBIC CULTURE (SURGICAL/DEEP WOUND)

## 2017-05-17 LAB — AEROBIC/ANAEROBIC CULTURE W GRAM STAIN (SURGICAL/DEEP WOUND): Culture: NO GROWTH

## 2017-05-25 ENCOUNTER — Other Ambulatory Visit: Payer: Self-pay | Admitting: *Deleted

## 2017-05-25 MED FILL — tiZANidine HCL 4 MG TABS: 4 | 7 days supply | Qty: 15 | Fill #0

## 2017-05-25 MED FILL — METOPROLOL SUCCINATE ER 100: 100 | 30 days supply | Qty: 60 | Fill #0

## 2017-05-25 NOTE — Patient Outreach (Signed)
Transition of care phone outreach. Jacqulyn had right total knee revision surgery on 2/8 and was transferred  to [facility] skilled nursing rehab on 2/12.  Khloe Hunkele on her mobile number today but she was in the process of being discharged home from [facility] so with Kenslie's agreement, will call her tomorrow at 10:30 am to complete transition of care phone call. Barrington Ellison RN,CCM,CDE [place] Management Coordinator Office Phone [phone] Office Fax [phone]

## 2017-05-26 ENCOUNTER — Other Ambulatory Visit: Payer: Self-pay | Admitting: *Deleted

## 2017-05-26 ENCOUNTER — Encounter: Payer: Self-pay | Admitting: *Deleted

## 2017-05-26 NOTE — Patient Outreach (Signed)
Leslie Martinez had right knee revision on 05/12/17 at [facility] related to loosening of knee joint prosthesis, discharged to [facility] skilled nursing facility on 05/16/17 and discharged to home with home health services on 05/25/17. Leslie Martinez via her mobile number and completed transition of Martinez call. See transition of Martinez template for details. She states she is doing well, with good pain management with oral medications for pain and muscle spasms. She says she is ambulating with her rolling walker without difficulty.She denies difficulty with voiding or with bowel movements.  She says she has not checked her blood sugar since she has been home. Reviewed her blood sugar control in the hospital and she was aware that her Hgb A1C was 6.8% when it was checked on 05/14/17. Leslie Martinez enrolled in the [facility] for type II diabetes management on 03/31/17 but there is no data showing in the platform since she enrolled and she said she could never get the app to work with her phone. Discussed the option for enrolling in the disease management program through [facility] [employer] [facility] [employer]. Advised her she would receive the same program pharmacy benefits but would be required to switch to an Abbott glucometer and testing supplies. She wishes to pursue this option, so will ask Leslie Martinez, Martinez management assistant, to make referral to [facility] [employer] program.  No other Martinez management needs assessed so will close case and mail successful outreach letter with [facility] CM information and this RNCM's contact information should Leslie Martinez have future Martinez management needs.   Barrington Ellison RN,CCM,CDE [place] Management Coordinator Office Phone [phone] Office Fax [phone]

## 2017-06-08 MED FILL — OXYCOD/ACETAMINOPHEN 5-325M: 5-325 | 7 days supply | Qty: 30 | Fill #0

## 2017-06-14 ENCOUNTER — Ambulatory Visit: Payer: No Typology Code available for payment source | Admitting: Physical Therapy

## 2017-06-15 ENCOUNTER — Ambulatory Visit: Payer: No Typology Code available for payment source | Attending: Orthopedic Surgery | Admitting: Physical Therapy

## 2017-06-15 ENCOUNTER — Encounter: Payer: Self-pay | Admitting: Physical Therapy

## 2017-06-15 DIAGNOSIS — R262 Difficulty in walking, not elsewhere classified: Secondary | ICD-10-CM | POA: Insufficient documentation

## 2017-06-15 DIAGNOSIS — M25562 Pain in left knee: Secondary | ICD-10-CM | POA: Insufficient documentation

## 2017-06-15 DIAGNOSIS — M25662 Stiffness of left knee, not elsewhere classified: Secondary | ICD-10-CM | POA: Diagnosis present

## 2017-06-15 DIAGNOSIS — R600 Localized edema: Secondary | ICD-10-CM | POA: Insufficient documentation

## 2017-06-15 DIAGNOSIS — Z96652 Presence of left artificial knee joint: Secondary | ICD-10-CM | POA: Diagnosis present

## 2017-06-15 NOTE — Patient Instructions (Signed)
Hamstring Stretch, Reclined (Strap, Doorframe)    Lengthen bottom leg on floor. Extend top leg along edge of doorframe or press foot up into yoga strap. Hold for _3___ breaths. Repeat __3__ times each leg.  Copyright  VHI. All rights reserved.    HIP: Hamstrings - Short Sitting    Rest leg on raised surface. Keep knee straight. Lift chest. Hold __30_ seconds. _3__ reps per set, __2_ sets per day, __7_ days per week  Copyright  VHI. All rights reserved.    OR    Heel Slide    Bend knee and pull heel toward buttocks. Hold __30__ seconds. Return. Repeat with other knee. Repeat __3__ times. Do ___2_ sessions per day. Use a sheet to pull   http://gt2.exer.us/372   Copyright  VHI. All rights reserved.
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
## 2017-06-19 ENCOUNTER — Encounter: Payer: Self-pay | Admitting: Physical Therapy

## 2017-06-19 ENCOUNTER — Ambulatory Visit: Payer: No Typology Code available for payment source | Admitting: Physical Therapy

## 2017-06-19 DIAGNOSIS — M25662 Stiffness of left knee, not elsewhere classified: Secondary | ICD-10-CM

## 2017-06-19 DIAGNOSIS — R600 Localized edema: Secondary | ICD-10-CM

## 2017-06-19 DIAGNOSIS — R262 Difficulty in walking, not elsewhere classified: Secondary | ICD-10-CM

## 2017-06-19 DIAGNOSIS — M25562 Pain in left knee: Secondary | ICD-10-CM

## 2017-06-19 DIAGNOSIS — Z96652 Presence of left artificial knee joint: Secondary | ICD-10-CM

## 2017-06-19 NOTE — Therapy (Signed)
[place], [place], [address] Phone: [phone]   Fax:  [phone]  Physical Therapy Treatment  Patient Details  Name: Leslie Martinez MRN: [id number] Date of Birth: 09-14-1965 Referring Provider: Dr. Frederik Pear    Encounter Date: 06/19/2017  PT End of Session - 06/19/17 1804    Visit Number  2    Number of Visits  12    Date for PT Re-Evaluation  07/27/17    PT Start Time  1505    PT Stop Time  1545    PT Time Calculation (min)  40 min    Activity Tolerance  Patient tolerated treatment well    Behavior During Therapy  [facility] for tasks assessed/performed       Past Medical History:  Diagnosis Date  . Anxiety   . Asthma    seasonal allergies, dry cough today   . 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 Cervical cancer ([place])    "stage III; discovered S/P hysterectomy" (05/12/2017)  . Chronic bronchitis ([place])   . Depression   . Esophageal stricture   . Esophagitis   . Gastritis   . GERD (gastroesophageal reflux disease)   . Hiatal hernia    history of, treated with surgery  . History of blood transfusion 1990s   "related to childbirth"  . History of kidney stones    passed stones spontaneously, also cystoscopy- done   . Hypertension   . Migraine    "monthly" (05/12/2017)  . Osteoarthritis of both knees   . Pancreatitis   . Pneumonia 2000s X1  . Pre-diabetes   . 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 Seasonal allergies   . Sleep apnea    Had nasal sx, and uses no cpap    Past Surgical History:  Procedure Laterality Date  . APPENDECTOMY    . CARDIAC CATHETERIZATION  1990's X 1  . COLONOSCOPY    . CYSTOSCOPY W/ STONE MANIPULATION    . DILATION AND CURETTAGE OF UTERUS    . ENDOMETRIAL ABLATION    . ESOPHAGOGASTRODUODENOSCOPY (EGD) WITH ESOPHAGEAL DILATION  "several times"  . HERNIA REPAIR    . JOINT REPLACEMENT    . KIDNEY POLYPS REMOVED    . KNEE ARTHROSCOPY Bilateral   . LAPAROSCOPIC  NISSEN FUNDOPLICATION  54/09/5679   Procedure: LAPAROSCOPIC NISSEN FUNDOPLICATION;  Surgeon: Pedro Earls, MD;  [facility];  Service: General;  Laterality: N/A;  . NASAL SEPTUM SURGERY  2000s   "w/adenoids and hyoid bone removed"  . REVISION TOTAL KNEE ARTHROPLASTY Left 05/12/2017  . TONSILLECTOMY  2004   T&A- due to sleep apnea   . TOTAL KNEE ARTHROPLASTY Left 07/13/2015   Procedure: TOTAL KNEE ARTHROPLASTY;  Surgeon: Frederik Pear, MD;  Location: [place] [facility];  Service: Orthopedics;  Laterality: Left;  . TOTAL KNEE REVISION Left 05/12/2017   Procedure: TOTAL KNEE REVISION;  Surgeon: Frederik Pear, MD;  Location: [facility];  Service: Orthopedics;  Laterality: Left;  . TUBAL LIGATION    . UPPER GASTROINTESTINAL ENDOSCOPY    . UPPER GI ENDOSCOPY  02/03/2012   Procedure: UPPER GI ENDOSCOPY;  Surgeon: Pedro Earls, MD;  [facility];  Service: General;;  . VAGINAL HYSTERECTOMY    . WISDOM TOOTH EXTRACTION      There were no vitals filed for this visit.  Subjective Assessment - 06/19/17 1512    Subjective  I have been up most of the night.    I exercise  about 30 minutes a day.      Currently in Pain?  Yes    Pain Score  3     Pain Location  Knee    Pain Orientation  Left    Pain Descriptors / Indicators  Aching;Constant    Pain Type  Surgical pain    Pain Frequency  Intermittent    Aggravating Factors   night  sit stand walking to long    Pain Relieving Factors  RICE    Effect of Pain on Daily Activities  limits mobility,  sleeping,  activities outside.                        [place] Adult PT Treatment/Exercise - 06/19/17 0001      Knee/Hip Exercises: Standing   Terminal Knee Extension  10 reps;3 sets leg 3 positions,  green band    Forward Step Up  Left;1 set;10 reps;Hand Hold: 2;Step Height: 6" cues,  knee snaps end range      Moist Heat Therapy   Moist Heat Location  Knee concurrent with some mnual,  patellar creeping,      Manual Therapy   Manual Therapy   Edema management;Taping    Manual therapy comments  tissue osftened,  patella more mobile,  scar more mobile,  edema visably reduced    Edema Management  retrograde soft tissue,  lymph system vacume activation and 2 fans  medial lateral knee    Kinesiotex  Edema;Inhibit Muscle;Facilitate Muscle      Kinesiotix   Edema  medial/lateral knee    Inhibit Muscle   anterior tib    Facilitate Muscle   quad             PT Education - 06/19/17 1804    Education provided  Yes    Education Details  edema controp,  taping info    Person(s) Educated  Patient    Methods  Explanation;Demonstration;Verbal cues    Comprehension  Verbalized understanding          PT Long Term Goals - 06/15/17 1625      PT LONG TERM GOAL #1   Title  Pt will be I with HEP and concepts of RICE, edema mgmt.     Time  6    Period  Weeks    Status  New    Target Date  07/27/17      PT LONG TERM GOAL #2   Title  Pt will be able to walk without noticeable limp in the community, as needed with pain minimal.     Time  6    Period  Weeks    Status  New    Target Date  07/27/17      PT LONG TERM GOAL #3   Title  Pt will be able to bend knee to 120 deg or more for improved transfers, mobility.     Time  6    Period  Weeks    Status  New    Target Date  07/27/17      PT LONG TERM GOAL #4   Title  Pt will be able to extend knee to 10 deg or less for improved heel strike, gait efficiency.     Time  6    Period  Weeks    Status  New    Target Date  07/27/17      PT LONG TERM GOAL #5   Title  FOTO score  will improve e to 50% or better for improved functional mobility.     Time  6    Period  Weeks    Status  New    Target Date  07/27/17            Plan - 06/19/17 1805    Clinical Impression Statement  pain greatly improved at end of session.  edema visably reduced.  Patient lacks extension end range knee control  with step ups.  Will be able to focus more on exercise next visit with closed chain  strengthening    PT Next Visit Plan  check  HEP and progress ROM, closed chain preferred for strength, edema, vaso     PT Home Exercise Plan  has from [place], issued flexion/ext of knee     Consulted and Agree with Plan of Care  Patient       Patient will benefit from skilled therapeutic intervention in order to improve the following deficits and impairments:     Visit Diagnosis: Stiffness of left knee, not elsewhere classified  Difficulty in walking, not elsewhere classified  Acute pain of left knee  S/P revision of total knee, left  Localized edema     Problem List Patient Active Problem List   Diagnosis Date Noted  . Type 2 diabetes mellitus without complication ([place]) 63/78/5885  . AKI (acute kidney injury) ([place]) 05/14/2017  . 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 Depression 07/23/2015  . Generalized anxiety disorder 07/23/2015  . Primary osteoarthritis of knee 07/13/2015  . Primary osteoarthritis of left knee 07/11/2015  . 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 Lap Nissen Nov 2013 02/06/2012  . Essential hypertension 01/09/2008  . 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 Asthma, chronic 01/09/2008  . BARRETT'S ESOPHAGUS, HX OF 01/09/2008  . NEPHROLITHIASIS, HX OF 01/09/2008  . DYSPHAGIA UNSPECIFIED 11/26/2007    Ilah Boule PTA 06/19/2017, 6:08 PM  [facility] [address] [place], [place], [address] Phone: [phone]   Fax:  [phone]  Name: Leslie Martinez MRN: [id number] Date of Birth: October 13, 1965

## 2017-06-22 ENCOUNTER — Ambulatory Visit: Payer: No Typology Code available for payment source | Admitting: Physical Therapy

## 2017-06-22 ENCOUNTER — Telehealth: Payer: Self-pay | Admitting: Physical Therapy

## 2017-06-22 NOTE — Telephone Encounter (Signed)
Called patient about missed visit.  She was in the MD office with her grandson.  She meant to call but forgot.  She will call later today  and try to reschedule, otherwise her next appointment is 06/28/2017.  She said she would continue to do her exercises.   Melvenia Needles PTA

## 2017-06-28 ENCOUNTER — Ambulatory Visit: Payer: No Typology Code available for payment source | Admitting: Physical Therapy

## 2017-06-28 ENCOUNTER — Encounter: Payer: Self-pay | Admitting: Physical Therapy

## 2017-06-28 DIAGNOSIS — M25662 Stiffness of left knee, not elsewhere classified: Secondary | ICD-10-CM | POA: Diagnosis not present

## 2017-06-28 DIAGNOSIS — Z96652 Presence of left artificial knee joint: Secondary | ICD-10-CM

## 2017-06-28 DIAGNOSIS — R262 Difficulty in walking, not elsewhere classified: Secondary | ICD-10-CM

## 2017-06-28 DIAGNOSIS — M25562 Pain in left knee: Secondary | ICD-10-CM

## 2017-06-28 DIAGNOSIS — R600 Localized edema: Secondary | ICD-10-CM

## 2017-06-28 NOTE — Patient Instructions (Signed)
Wall Sit: Half (Active)    Back against wall, slide down so knees are halfway to parallel with floor. Hold 1 to 10___ seconds. Rise to an erect position. Use _0__ lbs. Complete 1-2___ sets of _10__ repetitions. Perform __1_ sessions per day.  http://gtsc.exer.us/443   Copyright  VHI. All rights reserved.

## 2017-06-28 NOTE — Therapy (Signed)
[facility] [place], [place], [address] Phone: [phone]   Fax:  [phone]  Physical Therapy Treatment  Patient Details  Name: Leslie Martinez MRN: [id number] Date of Birth: 06-16-1965 Referring Provider: Dr. Frederik Pear    Encounter Date: 06/28/2017  PT End of Session - 06/28/17 1501    Visit Number  3    Number of Visits  12    Date for PT Re-Evaluation  07/27/17    PT Start Time  8338    PT Stop Time  1500    PT Time Calculation (min)  43 min    Activity Tolerance  Patient tolerated treatment well    Behavior During Therapy  [facility] for tasks assessed/performed       Past Medical History:  Diagnosis Date  . Anxiety   . Asthma    seasonal allergies, dry cough today   . 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 Cervical cancer ([place])    "stage III; discovered S/P hysterectomy" (05/12/2017)  . Chronic bronchitis ([place])   . Depression   . Esophageal stricture   . Esophagitis   . Gastritis   . GERD (gastroesophageal reflux disease)   . Hiatal hernia    history of, treated with surgery  . History of blood transfusion 1990s   "related to childbirth"  . History of kidney stones    passed stones spontaneously, also cystoscopy- done   . Hypertension   . Migraine    "monthly" (05/12/2017)  . Osteoarthritis of both knees   . Pancreatitis   . Pneumonia 2000s X1  . Pre-diabetes   . 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 Seasonal allergies   . Sleep apnea    Had nasal sx, and uses no cpap    Past Surgical History:  Procedure Laterality Date  . APPENDECTOMY    . CARDIAC CATHETERIZATION  1990's X 1  . COLONOSCOPY    . CYSTOSCOPY W/ STONE MANIPULATION    . DILATION AND CURETTAGE OF UTERUS    . ENDOMETRIAL ABLATION    . ESOPHAGOGASTRODUODENOSCOPY (EGD) WITH ESOPHAGEAL DILATION  "several times"  . HERNIA REPAIR    . JOINT REPLACEMENT    . KIDNEY POLYPS REMOVED    . KNEE ARTHROSCOPY Bilateral   . LAPAROSCOPIC  NISSEN FUNDOPLICATION  25/0/5397   Procedure: LAPAROSCOPIC NISSEN FUNDOPLICATION;  Surgeon: Pedro Earls, MD;  [facility];  Service: General;  Laterality: N/A;  . NASAL SEPTUM SURGERY  2000s   "w/adenoids and hyoid bone removed"  . REVISION TOTAL KNEE ARTHROPLASTY Left 05/12/2017  . TONSILLECTOMY  2004   T&A- due to sleep apnea   . TOTAL KNEE ARTHROPLASTY Left 07/13/2015   Procedure: TOTAL KNEE ARTHROPLASTY;  Surgeon: Frederik Pear, MD;  Location: [place];  Service: Orthopedics;  Laterality: Left;  . TOTAL KNEE REVISION Left 05/12/2017   Procedure: TOTAL KNEE REVISION;  Surgeon: Frederik Pear, MD;  Location: [place];  Service: Orthopedics;  Laterality: Left;  . TUBAL LIGATION    . UPPER GASTROINTESTINAL ENDOSCOPY    . UPPER GI ENDOSCOPY  02/03/2012   Procedure: UPPER GI ENDOSCOPY;  Surgeon: Pedro Earls, MD;  [facility];  Service: General;;  . VAGINAL HYSTERECTOMY    . WISDOM TOOTH EXTRACTION      There were no vitals filed for this visit.  Subjective Assessment - 06/28/17 1430    Subjective  Just a little pain.  i have been been sleeping better,  i  have been doing the HEP.    Currently in Pain?  Yes    Pain Score  -- mild no #    Pain Location  Knee    Pain Orientation  Left    Pain Descriptors / Indicators  Sore    Pain Frequency  Intermittent    Aggravating Factors   longer standing    Pain Relieving Factors  RICE         [facility] PT Assessment - 06/28/17 0001      Circumferential Edema   Circumferential - Right  -- 53 cm    Circumferential - Left   21 inches            No data recorded       [facility] Adult PT Treatment/Exercise - 06/28/17 0001      Knee/Hip Exercises: Standing   Heel Raises  20 reps up 2 down 1    Knee Flexion  10 reps    SLS  3 seconds max LT    SLS with Vectors  toe taps multi directions      Knee/Hip Exercises: Seated   Long Arc Quad  10 reps    Long Arc Quad Weight  2 lbs.    Heel Slides  10 reps with toe taps      Manual  Therapy   Kinesiotex  Edema;Inhibit Muscle;Facilitate Muscle      Kinesiotix   Edema  medial/lateral knee    Inhibit Muscle   anterior tib    Facilitate Muscle   quad             PT Education - 06/28/17 1500    Education provided  Yes    Education Details  continue ex abd edema control,  HEP    Person(s) Educated  Patient    Methods  Explanation          PT Long Term Goals - 06/15/17 1625      PT LONG TERM GOAL #1   Title  Pt will be I with HEP and concepts of RICE, edema mgmt.     Time  6    Period  Weeks    Status  New    Target Date  07/27/17      PT LONG TERM GOAL #2   Title  Pt will be able to walk without noticeable limp in the community, as needed with pain minimal.     Time  6    Period  Weeks    Status  New    Target Date  07/27/17      PT LONG TERM GOAL #3   Title  Pt will be able to bend knee to 120 deg or more for improved transfers, mobility.     Time  6    Period  Weeks    Status  New    Target Date  07/27/17      PT LONG TERM GOAL #4   Title  Pt will be able to extend knee to 10 deg or less for improved heel strike, gait efficiency.     Time  6    Period  Weeks    Status  New    Target Date  07/27/17      PT LONG TERM GOAL #5   Title  FOTO score will improve e to 50% or better for improved functional mobility.     Time  6    Period  Weeks    Status  New  Target Date  07/27/17            Plan - 06/28/17 1624    Clinical Impression Statement  Patient having pancreitis pain today.  Edema measured and it has not changed since eval.  Patient was able to demo increased quad control with step ups today.  Mild pain at end of session,  she declined the need for modalities.     PT Next Visit Plan  check  HEP and progress ROM, closed chain preferred for strength, edema, vaso .  reprint wall sit ex and issue.      PT Home Exercise Plan  has from [place], issued flexion/ext of knee     Consulted and Agree with Plan of Care  Patient        Patient will benefit from skilled therapeutic intervention in order to improve the following deficits and impairments:     Visit Diagnosis: Stiffness of left knee, not elsewhere classified  Difficulty in walking, not elsewhere classified  Acute pain of left knee  S/P revision of total knee, left  Localized edema     Problem List Patient Active Problem List   Diagnosis Date Noted  . Type 2 diabetes mellitus without complication ([place]) 17/91/5056  . AKI (acute kidney injury) ([place]) 05/14/2017  . Loosening of knee joint prosthesis ([place]) 05/11/2017  . Depression 07/23/2015  . Generalized anxiety disorder 07/23/2015  . Primary osteoarthritis of knee 07/13/2015  . Primary osteoarthritis of left knee 07/11/2015  . 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 Lap Nissen Nov 2013 02/06/2012  . Essential hypertension 01/09/2008  . 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 Asthma, chronic 01/09/2008  . BARRETT'S ESOPHAGUS, HX OF 01/09/2008  . NEPHROLITHIASIS, HX OF 01/09/2008  . DYSPHAGIA UNSPECIFIED 11/26/2007    Adler Alton PTA 06/28/2017, 4:27 PM  [facility] [address] [place], [place], [address] Phone: [phone]   Fax:  [phone]  Name: JOUD PETTINATO MRN: [id number] Date of Birth: September 29, 1965

## 2017-06-29 ENCOUNTER — Encounter: Payer: Self-pay | Admitting: Physical Therapy

## 2017-06-29 ENCOUNTER — Ambulatory Visit: Payer: No Typology Code available for payment source | Admitting: Physical Therapy

## 2017-06-29 DIAGNOSIS — R262 Difficulty in walking, not elsewhere classified: Secondary | ICD-10-CM

## 2017-06-29 DIAGNOSIS — R600 Localized edema: Secondary | ICD-10-CM

## 2017-06-29 DIAGNOSIS — M25662 Stiffness of left knee, not elsewhere classified: Secondary | ICD-10-CM | POA: Diagnosis not present

## 2017-06-29 DIAGNOSIS — Z96652 Presence of left artificial knee joint: Secondary | ICD-10-CM

## 2017-06-29 DIAGNOSIS — M25562 Pain in left knee: Secondary | ICD-10-CM

## 2017-06-29 NOTE — Therapy (Signed)
[place], [place], [address] Phone: [phone]   Fax:  [phone]  Physical Therapy Treatment  Patient Details  Name: Leslie Martinez MRN: [id number] Date of Birth: 04-Dec-1965 Referring Provider: Dr. Frederik Pear    Encounter Date: 06/29/2017  PT End of Session - 06/29/17 1306    Visit Number  4    Number of Visits  12    Date for PT Re-Evaluation  07/27/17    PT Start Time  0101    PT Stop Time  0200    PT Time Calculation (min)  59 min       Past Medical History:  Diagnosis Date  . Anxiety   . Asthma    seasonal allergies, dry cough today   . 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 Cervical cancer ([place])    "stage III; discovered S/P hysterectomy" (05/12/2017)  . Chronic bronchitis ([place])   . Depression   . Esophageal stricture   . Esophagitis   . Gastritis   . GERD (gastroesophageal reflux disease)   . Hiatal hernia    history of, treated with surgery  . History of blood transfusion 1990s   "related to childbirth"  . History of kidney stones    passed stones spontaneously, also cystoscopy- done   . Hypertension   . Migraine    "monthly" (05/12/2017)  . Osteoarthritis of both knees   . Pancreatitis   . Pneumonia 2000s X1  . Pre-diabetes   . 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 Seasonal allergies   . Sleep apnea    Had nasal sx, and uses no cpap    Past Surgical History:  Procedure Laterality Date  . APPENDECTOMY    . CARDIAC CATHETERIZATION  1990's X 1  . COLONOSCOPY    . CYSTOSCOPY W/ STONE MANIPULATION    . DILATION AND CURETTAGE OF UTERUS    . ENDOMETRIAL ABLATION    . ESOPHAGOGASTRODUODENOSCOPY (EGD) WITH ESOPHAGEAL DILATION  "several times"  . HERNIA REPAIR    . JOINT REPLACEMENT    . KIDNEY POLYPS REMOVED    . KNEE ARTHROSCOPY Bilateral   . LAPAROSCOPIC NISSEN FUNDOPLICATION  88/08/275   Procedure: LAPAROSCOPIC NISSEN FUNDOPLICATION;  Surgeon: Pedro Earls, MD;   [facility];  Service: General;  Laterality: N/A;  . NASAL SEPTUM SURGERY  2000s   "w/adenoids and hyoid bone removed"  . REVISION TOTAL KNEE ARTHROPLASTY Left 05/12/2017  . TONSILLECTOMY  2004   T&A- due to sleep apnea   . TOTAL KNEE ARTHROPLASTY Left 07/13/2015   Procedure: TOTAL KNEE ARTHROPLASTY;  Surgeon: Frederik Pear, MD;  Location: [place];  Service: Orthopedics;  Laterality: Left;  . TOTAL KNEE REVISION Left 05/12/2017   Procedure: TOTAL KNEE REVISION;  Surgeon: Frederik Pear, MD;  Location: [place];  Service: Orthopedics;  Laterality: Left;  . TUBAL LIGATION    . UPPER GASTROINTESTINAL ENDOSCOPY    . UPPER GI ENDOSCOPY  02/03/2012   Procedure: UPPER GI ENDOSCOPY;  Surgeon: Pedro Earls, MD;  [facility];  Service: General;;  . VAGINAL HYSTERECTOMY    . WISDOM TOOTH EXTRACTION      There were no vitals filed for this visit.  Subjective Assessment - 06/29/17 1306    Subjective  Doing okay today.     Currently in Pain?  Yes    Pain Score  2     Pain Location  Knee    Pain Orientation  Left  Pain Descriptors / Indicators  Aching                No data recorded       [facility] Adult PT Treatment/Exercise - 06/29/17 0001      Knee/Hip Exercises: Aerobic   Nustep  L5 UE/LE x 5 min      Knee/Hip Exercises: Standing   Heel Raises  20 reps up 2 down 1    Knee Flexion  10 reps    Terminal Knee Extension  10 reps;3 sets leg 3 positions,  green band    Wall Squat  15 reps cues     SLS  5 sec mac, 1 finger touch x 60 sec     SLS with Vectors  -- x 10 each way     Other Standing Knee Exercises  tandem stance left foot back x 30 sec without UE       Knee/Hip Exercises: Seated   Long Arc Quad  20 reps red    Hamstring Curl  20 reps    Hamstring Limitations  red band     Sit to Sand  10 reps;with UE support             PT Education - 06/29/17 1344    Education provided  Yes    Education Details  HEP    Person(s) Educated  Patient    Methods   Explanation;Handout    Comprehension  Verbalized understanding          PT Long Term Goals - 06/15/17 1625      PT LONG TERM GOAL #1   Title  Pt will be I with HEP and concepts of RICE, edema mgmt.     Time  6    Period  Weeks    Status  New    Target Date  07/27/17      PT LONG TERM GOAL #2   Title  Pt will be able to walk without noticeable limp in the community, as needed with pain minimal.     Time  6    Period  Weeks    Status  New    Target Date  07/27/17      PT LONG TERM GOAL #3   Title  Pt will be able to bend knee to 120 deg or more for improved transfers, mobility.     Time  6    Period  Weeks    Status  New    Target Date  07/27/17      PT LONG TERM GOAL #4   Title  Pt will be able to extend knee to 10 deg or less for improved heel strike, gait efficiency.     Time  6    Period  Weeks    Status  New    Target Date  07/27/17      PT LONG TERM GOAL #5   Title  FOTO score will improve e to 50% or better for improved functional mobility.     Time  6    Period  Weeks    Status  New    Target Date  07/27/17            Plan - 06/29/17 1344    Clinical Impression Statement  Pt reports she is feeling better today. No increased pain after yesterday's treatment. Repeated wall slides and issued for HEP. Also began sit-stands using hands on knees and added to HEP. She has difficulty with  single leg leg press in small ROM at first and then improved with repetitions and was able to increase ROM.     PT Next Visit Plan  check  HEP and progress ROM, closed chain preferred for strength, edema, vaso .      PT Home Exercise Plan  has from [place], issued flexion/ext of knee , wall slide, sit-stand     Consulted and Agree with Plan of Care  Patient       Patient will benefit from skilled therapeutic intervention in order to improve the following deficits and impairments:  Pain, Impaired flexibility, Increased fascial restricitons, Increased edema, Decreased range of  motion, Decreased mobility, Decreased strength, Difficulty walking, Impaired sensation, Abnormal gait  Visit Diagnosis: Stiffness of left knee, not elsewhere classified  Difficulty in walking, not elsewhere classified  Acute pain of left knee  Localized edema  S/P revision of total knee, left     Problem List Patient Active Problem List   Diagnosis Date Noted  . Type 2 diabetes mellitus without complication ([place]) 09/60/4540  . AKI (acute kidney injury) ([place]) 05/14/2017  . 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 Depression 07/23/2015  . Generalized anxiety disorder 07/23/2015  . Primary osteoarthritis of knee 07/13/2015  . Primary osteoarthritis of left knee 07/11/2015  . 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 Lap Nissen Nov 2013 02/06/2012  . Essential hypertension 01/09/2008  . 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 Asthma, chronic 01/09/2008  . BARRETT'S ESOPHAGUS, HX OF 01/09/2008  . NEPHROLITHIASIS, HX OF 01/09/2008  . DYSPHAGIA UNSPECIFIED 11/26/2007    Dorene Ar, PTA 06/29/2017, 1:51 PM  [place], [place], [address] Phone: [phone]   Fax:  [phone]  Name: Leslie Martinez MRN: [id number] Date of Birth: 1965/10/12

## 2017-06-30 ENCOUNTER — Encounter: Payer: Self-pay | Admitting: Family Medicine

## 2017-06-30 ENCOUNTER — Ambulatory Visit (INDEPENDENT_AMBULATORY_CARE_PROVIDER_SITE_OTHER): Payer: No Typology Code available for payment source | Admitting: Family Medicine

## 2017-06-30 ENCOUNTER — Other Ambulatory Visit: Payer: Self-pay

## 2017-06-30 DIAGNOSIS — E119 Type 2 diabetes mellitus without complications: Secondary | ICD-10-CM | POA: Diagnosis not present

## 2017-06-30 DIAGNOSIS — I10 Essential (primary) hypertension: Secondary | ICD-10-CM

## 2017-06-30 MED ORDER — METFORMIN HCL 500 MG PO TABS: 500.0000 mg | ORAL_TABLET | Freq: Every day | ORAL | 0 refills | Status: DC

## 2017-06-30 MED ORDER — SERTRALINE HCL 100 MG PO TABS: 100.0000 mg | ORAL_TABLET | Freq: Every day | ORAL | 0 refills | Status: DC

## 2017-06-30 MED FILL — metFORMIN HCL 500 MG TABS: 500 | 90 days supply | Qty: 90 | Fill #0

## 2017-06-30 MED FILL — SERTRALINE HCL 100 MG TAB: 100 | 90 days supply | Qty: 90 | Fill #0

## 2017-06-30 NOTE — Patient Instructions (Addendum)
Thank you for coming in today, it was so nice to see you! Today we talked about:    Blood pressure: Since your blood pressure have been low previously, I would like for you to eventually stop your Metoprolol. We will need to wean you off of this. Take 100mg  once daily for one week, then 100mg  every other day for a week, then stop the medication. Continue the Benicar at the current dose  Diabetes: You do have diabetes based off of your A1C of 6.8.   We will need to start you on Metformin:   Week 1: Take 500 mg once daily.  Week 2: Take 500 mg twice daily (once in the morning once at night). Week 3: Take 1000 mg once daily. Week 4: Take 1000 mg twice daily entheses once in the morning once at night)  Please follow up in 2 weeks for blood pressure. You can schedule this appointment at the front desk before you leave or call the clinic.  Bring in all your medications or supplements to each appointment for review.   If we ordered any tests today, you will be notified via telephone of any abnormalities. If everything is normal you will get a letter in the mail.   If you have any questions or concerns, please do not hesitate to call the office at [phone]. You can also message me directly via MyChart.   Sincerely,  Smitty Cords, MD

## 2017-06-30 NOTE — Progress Notes (Signed)
HPI:  Leslie Martinez is a 52 y.o. year old female who presents today for a new patient appointment to establish general primary care, also to discuss .  1.  Blood Pressure; patient notes that her blood pressures have been low at home intermittently.  They go into the 90s over 50s when she takes both of her blood pressure medicines.  She has been taking Benicar and metoprolol every other day to avoid this.  She has been on several blood pressure medications in the past including losartan, hydrochlorothiazide, amlodipine, lisinopril as she had poorly controlled blood pressure.  ROS: See HPI  Past Medical Hx:  Past Medical History:  Diagnosis Date  . Anxiety   . Asthma    seasonal allergies, dry cough today   . 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 Cervical cancer ([place])    "stage III; discovered S/P hysterectomy" (05/12/2017)  . Chronic bronchitis ([place])   . Depression   . Esophageal stricture   . Esophagitis   . Gastritis   . GERD (gastroesophageal reflux disease)   . Hiatal hernia    history of, treated with surgery  . History of blood transfusion 1990s   "related to childbirth"  . History of kidney stones    passed stones spontaneously, also cystoscopy- done   . Hypertension   . Migraine    "monthly" (05/12/2017)  . Osteoarthritis of both knees   . Pancreatitis   . Pneumonia 2000s X1  . Pre-diabetes   . 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 Seasonal allergies   . Sleep apnea    Had nasal sx, and uses no cpap    Past Surgical Hx:  Past Surgical History:  Procedure Laterality Date  . APPENDECTOMY    . CARDIAC CATHETERIZATION  1990's X 1  . COLONOSCOPY    . CYSTOSCOPY W/ STONE MANIPULATION    . DILATION AND CURETTAGE OF UTERUS    . ENDOMETRIAL ABLATION    . ESOPHAGOGASTRODUODENOSCOPY (EGD) WITH ESOPHAGEAL DILATION  "several times"  . HERNIA REPAIR    . JOINT REPLACEMENT    . KIDNEY POLYPS REMOVED    . KNEE ARTHROSCOPY Bilateral   . LAPAROSCOPIC NISSEN FUNDOPLICATION   38/05/5051   Procedure: LAPAROSCOPIC NISSEN FUNDOPLICATION;  Surgeon: Pedro Earls, MD;  [facility];  Service: General;  Laterality: N/A;  . NASAL SEPTUM SURGERY  2000s   "w/adenoids and hyoid bone removed"  . REVISION TOTAL KNEE ARTHROPLASTY Left 05/12/2017  . TONSILLECTOMY  2004   T&A- due to sleep apnea   . TOTAL KNEE ARTHROPLASTY Left 07/13/2015   Procedure: TOTAL KNEE ARTHROPLASTY;  Surgeon: Frederik Pear, MD;  Location: [place];  Service: Orthopedics;  Laterality: Left;  . TOTAL KNEE REVISION Left 05/12/2017   Procedure: TOTAL KNEE REVISION;  Surgeon: Frederik Pear, MD;  Location: [place];  Service: Orthopedics;  Laterality: Left;  . TUBAL LIGATION    . UPPER GASTROINTESTINAL ENDOSCOPY    . UPPER GI ENDOSCOPY  02/03/2012   Procedure: UPPER GI ENDOSCOPY;  Surgeon: Pedro Earls, MD;  [facility];  Service: General;;  . VAGINAL HYSTERECTOMY    . WISDOM TOOTH EXTRACTION      Family Hx: updated in Epic  Social Hx:  Social History   Socioeconomic History  . Marital status: Divorced    Spouse name: Not on file  . Number of children: 3  . Years of education: Not on file  . Highest education level: Not on file  Occupational History  . Occupation: LPN  Social Needs  . Financial resource strain: Not on file  . Food insecurity:    Worry: Not on file    Inability: Not on file  . Transportation needs:    Medical: Not on file    Non-medical: Not on file  Tobacco Use  . Smoking status: Never Smoker  . Smokeless tobacco: Never Used  Substance and Sexual Activity  . Alcohol use: No    Frequency: Never  . Drug use: No  . Sexual activity: Not Currently  Lifestyle  . Physical activity:    Days per week: Not on file    Minutes per session: Not on file  . Stress: Not on file  Relationships  . Social connections:    Talks on phone: Not on file    Gets together: Not on file    Attends religious service: Not on file    Active member of club or organization: Not on file     Attends meetings of clubs or organizations: Not on file    Relationship status: Not on file  . Intimate partner violence:    Fear of current or ex partner: Not on file    Emotionally abused: Not on file    Physically abused: Not on file    Forced sexual activity: Not on file  Other Topics Concern  . Not on file  Social History Narrative  . Not on file    Health Maintenance:  Health Maintenance Due  Topic Date Due  . PNEUMOCOCCAL POLYSACCHARIDE VACCINE (1) 09/04/1966  . FOOT EXAM  09/04/1974  . OPHTHALMOLOGY EXAM  09/04/1974  . URINE MICROALBUMIN  09/04/1974  . HIV Screening  09/04/1979  . TETANUS/TDAP  09/04/1983  . PAP SMEAR  09/03/1985    PHYSICAL EXAM: BP 130/78   Pulse 60   Temp 98 F (36.7 C) (Oral)   Ht 5\' 8"  (1.727 m)   Wt 249 lb 3.2 oz (113 kg)   SpO2 97%   BMI 37.89 kg/m   General: In NAD, well developed, well nourished Cardiovascular: RRR, normal s1 and s2, no rubs, gallops, or murmurs Respiratory: no increased work of breathing, clear to auscultation bilaterally Abdomen: soft, non-tender,non-distended, +BS Extremities: no edema MSK: normal ROM  Neuro: A&O x3, no gross motor defecits  Psych: appropriate mood and affect    ASSESSMENT/PLAN:  Encounter to establish care : All medical information updated in epic.  Patient overall doing well, she recently was in the hospital for knee surgery.  Health maintenance: Up-to-date on colonoscopy, and Pap smear -Needs mammogram, referral placed  Essential hypertension Controlled, however she notes that her blood pressure drops into the 90s over 50s when she takes her medications consistently daily.  Discussed the possibility of coming off of metoprolol and discontinuing Medicare, she was agreeable to this -Will titrate off metoprolol, see AVS for instructions on this -Continue Benicar at current dose -Return in 1-2 weeks for blood pressure check   Type 2 diabetes mellitus without complication (HCC) Last X5Q  was 6.7.  She is not any diabetic medications.   -Begin metformin, see AVS for instructions on how to titrate this up -Discussed decreasing carbohydrate rich foods and sugary beverages -Follow-up in 2 weeks     Smitty Cords, MD PGY-3 [place]

## 2017-07-01 NOTE — Assessment & Plan Note (Signed)
Controlled, however she notes that her blood pressure drops into the 90s over 50s when she takes her medications consistently daily.  Discussed the possibility of coming off of metoprolol and discontinuing Medicare, she was agreeable to this -Will titrate off metoprolol, see AVS for instructions on this -Continue Benicar at current dose -Return in 1-2 weeks for blood pressure check

## 2017-07-01 NOTE — Assessment & Plan Note (Signed)
Last A1c was 6.7.  She is not any diabetic medications.   -Begin metformin, see AVS for instructions on how to titrate this up -Discussed decreasing carbohydrate rich foods and sugary beverages -Follow-up in 2 weeks

## 2017-07-04 ENCOUNTER — Ambulatory Visit: Payer: No Typology Code available for payment source | Admitting: Physical Therapy

## 2017-07-05 ENCOUNTER — Ambulatory Visit: Payer: No Typology Code available for payment source | Attending: Orthopedic Surgery | Admitting: Physical Therapy

## 2017-07-05 ENCOUNTER — Telehealth: Payer: Self-pay | Admitting: Physical Therapy

## 2017-07-05 DIAGNOSIS — Z96652 Presence of left artificial knee joint: Secondary | ICD-10-CM | POA: Insufficient documentation

## 2017-07-05 DIAGNOSIS — M25662 Stiffness of left knee, not elsewhere classified: Secondary | ICD-10-CM | POA: Insufficient documentation

## 2017-07-05 DIAGNOSIS — R262 Difficulty in walking, not elsewhere classified: Secondary | ICD-10-CM | POA: Insufficient documentation

## 2017-07-05 DIAGNOSIS — R600 Localized edema: Secondary | ICD-10-CM | POA: Insufficient documentation

## 2017-07-05 DIAGNOSIS — M25562 Pain in left knee: Secondary | ICD-10-CM | POA: Insufficient documentation

## 2017-07-05 NOTE — Telephone Encounter (Signed)
Called patient regarding her missed visit this AM. She overslept and will be here tomorrow for her next appt.  Raeford Razor, PT 07/05/17 12:45 PM Phone: [phone] Fax: [phone]

## 2017-07-06 ENCOUNTER — Ambulatory Visit: Payer: No Typology Code available for payment source | Admitting: Physical Therapy

## 2017-07-06 ENCOUNTER — Encounter: Payer: Self-pay | Admitting: Physical Therapy

## 2017-07-06 DIAGNOSIS — Z96652 Presence of left artificial knee joint: Secondary | ICD-10-CM | POA: Diagnosis present

## 2017-07-06 DIAGNOSIS — M25662 Stiffness of left knee, not elsewhere classified: Secondary | ICD-10-CM

## 2017-07-06 DIAGNOSIS — M25562 Pain in left knee: Secondary | ICD-10-CM | POA: Diagnosis present

## 2017-07-06 DIAGNOSIS — R600 Localized edema: Secondary | ICD-10-CM | POA: Diagnosis present

## 2017-07-06 DIAGNOSIS — R262 Difficulty in walking, not elsewhere classified: Secondary | ICD-10-CM | POA: Diagnosis present

## 2017-07-06 NOTE — Therapy (Signed)
[place], [place], [address] Phone: [phone]   Fax:  [phone]  Physical Therapy Treatment  Patient Details  Name: Leslie Martinez MRN: [id number] Date of Birth: 08-27-1965 Referring Provider: Dr. Frederik Pear    Encounter Date: 07/06/2017  PT End of Session - 07/06/17 1812    Visit Number  5    Number of Visits  12    Date for PT Re-Evaluation  07/27/17    PT Start Time  1418    PT Stop Time  1500    PT Time Calculation (min)  42 min    Activity Tolerance  Patient tolerated treatment well    Behavior During Therapy  [facility] for tasks assessed/performed       Past Medical History:  Diagnosis Date  . Anxiety   . Asthma    seasonal allergies, dry cough today   . 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 Cervical cancer ([place])    "stage III; discovered S/P hysterectomy" (05/12/2017)  . Chronic bronchitis ([place])   . Depression   . Esophageal stricture   . Esophagitis   . Gastritis   . GERD (gastroesophageal reflux disease)   . Hiatal hernia    history of, treated with surgery  . History of blood transfusion 1990s   "related to childbirth"  . History of kidney stones    passed stones spontaneously, also cystoscopy- done   . Hypertension   . Migraine    "monthly" (05/12/2017)  . Osteoarthritis of both knees   . Pancreatitis   . Pneumonia 2000s X1  . Pre-diabetes   . 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 Seasonal allergies   . Sleep apnea    Had nasal sx, and uses no cpap    Past Surgical History:  Procedure Laterality Date  . APPENDECTOMY    . CARDIAC CATHETERIZATION  1990's X 1  . COLONOSCOPY    . CYSTOSCOPY W/ STONE MANIPULATION    . DILATION AND CURETTAGE OF UTERUS    . ENDOMETRIAL ABLATION    . ESOPHAGOGASTRODUODENOSCOPY (EGD) WITH ESOPHAGEAL DILATION  "several times"  . HERNIA REPAIR    . JOINT REPLACEMENT    . KIDNEY POLYPS REMOVED    . KNEE ARTHROSCOPY Bilateral   . LAPAROSCOPIC NISSEN  FUNDOPLICATION  41/05/8784   Procedure: LAPAROSCOPIC NISSEN FUNDOPLICATION;  Surgeon: Pedro Earls, MD;  Location: [facility];  Service: General;  Laterality: N/A;  . NASAL SEPTUM SURGERY  2000s   "w/adenoids and hyoid bone removed"  . REVISION TOTAL KNEE ARTHROPLASTY Left 05/12/2017  . TONSILLECTOMY  2004   T&A- due to sleep apnea   . TOTAL KNEE ARTHROPLASTY Left 07/13/2015   Procedure: TOTAL KNEE ARTHROPLASTY;  Surgeon: Frederik Pear, MD;  Location: [place];  Service: Orthopedics;  Laterality: Left;  . TOTAL KNEE REVISION Left 05/12/2017   Procedure: TOTAL KNEE REVISION;  Surgeon: Frederik Pear, MD;  Location: [place];  Service: Orthopedics;  Laterality: Left;  . TUBAL LIGATION    . UPPER GASTROINTESTINAL ENDOSCOPY    . UPPER GI ENDOSCOPY  02/03/2012   Procedure: UPPER GI ENDOSCOPY;  Surgeon: Pedro Earls, MD;  [facility];  Service: General;;  . VAGINAL HYSTERECTOMY    . WISDOM TOOTH EXTRACTION      There were no vitals filed for this visit.  Subjective Assessment - 07/06/17 1423    Subjective    Saw the MD.  He said I had good bend and  i need to work on the straightening.  She is doing her exercises at home.  Vernard Gambles is sore  ,  new    pain.    Currently in Pain?  Yes    Pain Score  5     Pain Location  Tibia    Pain Orientation  Left    Pain Descriptors / Indicators  Sore    Pain Type  Acute pain    Aggravating Factors   walking,  heavy lifting for the move.    Pain Relieving Factors  rest,  ice heat mostly    Effect of Pain on Daily Activities  limits heavy lifting,  walking    Multiple Pain Sites  No         [facility] PT Assessment - 07/06/17 0001      AROM   Left Knee Extension  -8  after manual.    Left Knee Flexion  113                   [facility] Adult PT Treatment/Exercise - 07/06/17 0001      Knee/Hip Exercises: Aerobic   Nustep  8 minutes LE only 5 moinutes      Knee/Hip Exercises: Machines for Strengthening   Total Gym Leg Press  both 10 X 1, 2 plates  and single 1 plate 10 x 2      Knee/Hip Exercises: Seated   Long Arc Quad  10 reps    Long Arc Quad Weight  2 lbs.      Knee/Hip Exercises: Supine   Quad Sets Limitations  small roll under knee then made smaller as ROM increased multiple reps    Straight Leg Raises  20 reps with quad set    Patellar Mobs  yes    Knee Flexion  10 reps      Knee/Hip Exercises: Prone   Hamstring Curl  10 reps    Other Prone Exercises  terminal knee extension 10 X 2 sets,  cues initially      Manual Therapy   Manual Therapy  Passive ROM    Passive ROM  flexion and extension,  ROM improved  with this             PT Education - 07/06/17 1811    Education provided  Yes    Education Details  Exercise form for current    Person(s) Educated  Patient    Methods  Explanation;Tactile cues;Verbal cues    Comprehension  Verbalized understanding;Returned demonstration          PT Long Term Goals - 06/15/17 1625      PT LONG TERM GOAL #1   Title  Pt will be I with HEP and concepts of RICE, edema mgmt.     Time  6    Period  Weeks    Status  New    Target Date  07/27/17      PT LONG TERM GOAL #2   Title  Pt will be able to walk without noticeable limp in the community, as needed with pain minimal.     Time  6    Period  Weeks    Status  New    Target Date  07/27/17      PT LONG TERM GOAL #3   Title  Pt will be able to bend knee to 120 deg or more for improved transfers, mobility.     Time  6    Period  Weeks  Status  New    Target Date  07/27/17      PT LONG TERM GOAL #4   Title  Pt will be able to extend knee to 10 deg or less for improved heel strike, gait efficiency.     Time  6    Period  Weeks    Status  New    Target Date  07/27/17      PT LONG TERM GOAL #5   Title  FOTO score will improve e to 50% or better for improved functional mobility.     Time  6    Period  Weeks    Status  New    Target Date  07/27/17            Plan - 07/06/17 1813    Clinical  Impression Statement  Patient continues to make gains with her ROM  see flow sheet.  MD wanted her to focus on knee extension so that was main focus.    Pain increased peripatellar  with extension and anterior tib with gait.    Mild pain increases eased by the end of session and she declined the need of modalities for pain.      PT Next Visit Plan  progress ROM especially edxtension. closed chain preferred for strength,   consider gastroc stretch and some single leg ex/ balance    PT Home Exercise Plan  has from HHPT, issued flexion/ext of knee , wall slide, sit-stand     Consulted and Agree with Plan of Care  Patient       Patient will benefit from skilled therapeutic intervention in order to improve the following deficits and impairments:     Visit Diagnosis: Stiffness of left knee, not elsewhere classified  Acute pain of left knee  Localized edema  S/P revision of total knee, left     Problem List Patient Active Problem List   Diagnosis Date Noted  . Type 2 diabetes mellitus without complication ([place]) 51/05/5850  . Loosening of knee joint prosthesis ([place]) 05/11/2017  . Depression 07/23/2015  . Generalized anxiety disorder 07/23/2015  . Primary osteoarthritis of knee 07/13/2015  . Primary osteoarthritis of left knee 07/11/2015  . 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 Lap Nissen Nov 2013 02/06/2012  . Essential hypertension 01/09/2008  . 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 Asthma, chronic 01/09/2008  . BARRETT'S ESOPHAGUS, HX OF 01/09/2008  . NEPHROLITHIASIS, HX OF 01/09/2008  . DYSPHAGIA UNSPECIFIED 11/26/2007    HARRIS,KAREN  PTA 07/06/2017, 6:18 PM  [facility] [address] [place], [place], [address] Phone: [phone]   Fax:  [phone]  Name: Leslie Martinez MRN: [id number] Date of Birth: [birth year]

## 2017-07-11 ENCOUNTER — Ambulatory Visit: Payer: No Typology Code available for payment source | Admitting: Physical Therapy

## 2017-07-11 DIAGNOSIS — Z96652 Presence of left artificial knee joint: Secondary | ICD-10-CM

## 2017-07-11 DIAGNOSIS — M25662 Stiffness of left knee, not elsewhere classified: Secondary | ICD-10-CM

## 2017-07-11 DIAGNOSIS — R262 Difficulty in walking, not elsewhere classified: Secondary | ICD-10-CM

## 2017-07-11 DIAGNOSIS — M25562 Pain in left knee: Secondary | ICD-10-CM

## 2017-07-11 DIAGNOSIS — R600 Localized edema: Secondary | ICD-10-CM

## 2017-07-11 NOTE — Therapy (Signed)
[place], [place], [address] Phone: [phone]   Fax:  [phone]  Physical Therapy Treatment  Patient Details  Name: Leslie Martinez MRN: [id number] Date of Birth: Jan 11, 1966 Referring Provider: Dr. Frederik Pear    Encounter Date: 07/11/2017  PT End of Session - 07/11/17 1846    Visit Number  6    Number of Visits  12    Date for PT Re-Evaluation  07/27/17    PT Start Time  0923    PT Stop Time  1510    PT Time Calculation (min)  53 min    Activity Tolerance  Patient tolerated treatment well    Behavior During Therapy  [facility] for tasks assessed/performed       Past Medical History:  Diagnosis Date  . Anxiety   . Asthma    seasonal allergies, dry cough today   . 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 Cervical cancer ([place])    "stage III; discovered S/P hysterectomy" (05/12/2017)  . Chronic bronchitis ([place])   . Depression   . Esophageal stricture   . Esophagitis   . Gastritis   . GERD (gastroesophageal reflux disease)   . Hiatal hernia    history of, treated with surgery  . History of blood transfusion 1990s   "related to childbirth"  . History of kidney stones    passed stones spontaneously, also cystoscopy- done   . Hypertension   . Migraine    "monthly" (05/12/2017)  . Osteoarthritis of both knees   . Pancreatitis   . Pneumonia 2000s X1  . Pre-diabetes   . 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 Seasonal allergies   . Sleep apnea    Had nasal sx, and uses no cpap    Past Surgical History:  Procedure Laterality Date  . APPENDECTOMY    . CARDIAC CATHETERIZATION  1990's X 1  . COLONOSCOPY    . CYSTOSCOPY W/ STONE MANIPULATION    . DILATION AND CURETTAGE OF UTERUS    . ENDOMETRIAL ABLATION    . ESOPHAGOGASTRODUODENOSCOPY (EGD) WITH ESOPHAGEAL DILATION  "several times"  . HERNIA REPAIR    . JOINT REPLACEMENT    . KIDNEY POLYPS REMOVED    . KNEE ARTHROSCOPY Bilateral   . LAPAROSCOPIC NISSEN  FUNDOPLICATION  30/0/7622   Procedure: LAPAROSCOPIC NISSEN FUNDOPLICATION;  Surgeon: Pedro Earls, MD;  [facility];  Service: General;  Laterality: N/A;  . NASAL SEPTUM SURGERY  2000s   "w/adenoids and hyoid bone removed"  . REVISION TOTAL KNEE ARTHROPLASTY Left 05/12/2017  . TONSILLECTOMY  2004   T&A- due to sleep apnea   . TOTAL KNEE ARTHROPLASTY Left 07/13/2015   Procedure: TOTAL KNEE ARTHROPLASTY;  Surgeon: Frederik Pear, MD;  Location: [place];  Service: Orthopedics;  Laterality: Left;  . TOTAL KNEE REVISION Left 05/12/2017   Procedure: TOTAL KNEE REVISION;  Surgeon: Frederik Pear, MD;  Location: [place];  Service: Orthopedics;  Laterality: Left;  . TUBAL LIGATION    . UPPER GASTROINTESTINAL ENDOSCOPY    . UPPER GI ENDOSCOPY  02/03/2012   Procedure: UPPER GI ENDOSCOPY;  Surgeon: Pedro Earls, MD;  [facility];  Service: General;;  . VAGINAL HYSTERECTOMY    . WISDOM TOOTH EXTRACTION      There were no vitals filed for this visit.      [facility] PT Assessment - 07/11/17 0001      AROM   Left Knee Extension  10  lacks 10          [facility] Adult PT Treatment/Exercise - 07/11/17 0001      Knee/Hip Exercises: Stretches   Active Hamstring Stretch  Left;3 reps contract relax       Knee/Hip Exercises: Aerobic   Nustep  8 min LE only L5      Knee/Hip Exercises: Standing   Terminal Knee Extension  Strengthening;Left;1 set;15 reps    Wall Squat  15 reps cues     Other Standing Knee Exercises  quad activation in standing wall slides x 10       Knee/Hip Exercises: Supine   Quad Sets  Strengthening;Left;1 set;10 reps    Short Arc Quad Sets  Strengthening;Left;1 set;20 reps    Straight Leg Raises  Strengthening;Left;1 set;10 reps    Straight Leg Raise with External Rotation  Strengthening;Left;1 set;10 reps    Patellar Mobs  yes      Vasopneumatic   Number Minutes Vasopneumatic   15 minutes    Vasopnuematic Location   Knee    Vasopneumatic Pressure  Medium     Vasopneumatic Temperature   32      Manual Therapy   Manual Therapy  Joint mobilization;Soft tissue mobilization    Edema Management  retrograde for edema    Joint Mobilization  Gr. II-III extension mobs    Soft tissue mobilization  ant and posterior tibialis                   PT Long Term Goals - 06/15/17 1625      PT LONG TERM GOAL #1   Title  Pt will be I with HEP and concepts of RICE, edema mgmt.     Time  6    Period  Weeks    Status  New    Target Date  07/27/17      PT LONG TERM GOAL #2   Title  Pt will be able to walk without noticeable limp in the community, as needed with pain minimal.     Time  6    Period  Weeks    Status  New    Target Date  07/27/17      PT LONG TERM GOAL #3   Title  Pt will be able to bend knee to 120 deg or more for improved transfers, mobility.     Time  6    Period  Weeks    Status  New    Target Date  07/27/17      PT LONG TERM GOAL #4   Title  Pt will be able to extend knee to 10 deg or less for improved heel strike, gait efficiency.     Time  6    Period  Weeks    Status  New    Target Date  07/27/17      PT LONG TERM GOAL #5   Title  FOTO score will improve e to 50% or better for improved functional mobility.     Time  6    Period  Weeks    Status  New    Target Date  07/27/17            Plan - 07/11/17 1844    Clinical Impression Statement  Patient lacking 10 deg ROM today.  Possibly overworking her anterior tib during quad sets, addressed with massage today.  Tissue is moving well, edema present but no tight and stiff.  Progressing.  PT Treatment/Interventions  ADLs/Self Care Home Management;Electrical Stimulation;Functional mobility training;Neuromuscular re-education;Taping;Vasopneumatic Device;Passive range of motion;Gait training;Therapeutic exercise;Therapeutic activities;Manual lymph drainage;Manual techniques;Patient/family education;Cryotherapy    PT Next Visit Plan  progress ROM especially  edxtension. closed chain preferred for strength,   consider gastroc stretch and some single leg ex/ balance    PT Home Exercise Plan  has from HHPT, issued flexion/ext of knee , wall slide, sit-stand     Consulted and Agree with Plan of Care  Patient       Patient will benefit from skilled therapeutic intervention in order to improve the following deficits and impairments:  Pain, Impaired flexibility, Increased fascial restricitons, Increased edema, Decreased range of motion, Decreased mobility, Decreased strength, Difficulty walking, Impaired sensation, Abnormal gait  Visit Diagnosis: Stiffness of left knee, not elsewhere classified  Acute pain of left knee  Localized edema  S/P revision of total knee, left  Difficulty in walking, not elsewhere classified     Problem List Patient Active Problem List   Diagnosis Date Noted  . Type 2 diabetes mellitus without complication ([place]) 40/81/4481  . 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 Depression 07/23/2015  . Generalized anxiety disorder 07/23/2015  . Primary osteoarthritis of knee 07/13/2015  . Primary osteoarthritis of left knee 07/11/2015  . 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 Lap Nissen Nov 2013 02/06/2012  . Essential hypertension 01/09/2008  . 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 Asthma, chronic 01/09/2008  . BARRETT'S ESOPHAGUS, HX OF 01/09/2008  . NEPHROLITHIASIS, HX OF 01/09/2008  . DYSPHAGIA UNSPECIFIED 11/26/2007    PAA,JENNIFER 07/11/2017, 6:47 PM  [facility] [address] [place], [place], [address] Phone: [phone]   Fax:  [phone]  Name: Leslie Martinez MRN: [id number] Date of Birth: 1965/05/19  Raeford Razor, PT 07/11/17 6:47 PM Phone: [phone] Fax: [phone]

## 2017-07-13 ENCOUNTER — Ambulatory Visit: Payer: No Typology Code available for payment source | Admitting: Physical Therapy

## 2017-07-13 ENCOUNTER — Other Ambulatory Visit: Payer: Self-pay

## 2017-07-13 ENCOUNTER — Ambulatory Visit (HOSPITAL_COMMUNITY)
Admission: RE | Admit: 2017-07-13 | Discharge: 2017-07-13 | Disposition: A | Discharge status: Home / Self Care | Payer: No Typology Code available for payment source | Source: Ambulatory Visit | Attending: Family Medicine | Admitting: Family Medicine

## 2017-07-13 ENCOUNTER — Ambulatory Visit (INDEPENDENT_AMBULATORY_CARE_PROVIDER_SITE_OTHER): Payer: No Typology Code available for payment source | Admitting: Internal Medicine

## 2017-07-13 VITALS — BP 158/90 | HR 68 | Temp 97.6°F | Ht 68.0 in | Wt 244.0 lb

## 2017-07-13 DIAGNOSIS — R1012 Left upper quadrant pain: Secondary | ICD-10-CM | POA: Diagnosis not present

## 2017-07-13 DIAGNOSIS — R140 Abdominal distension (gaseous): Secondary | ICD-10-CM | POA: Insufficient documentation

## 2017-07-13 DIAGNOSIS — R1011 Right upper quadrant pain: Secondary | ICD-10-CM | POA: Insufficient documentation

## 2017-07-13 NOTE — Progress Notes (Signed)
   [facility] Phone: [phone]   Date of Visit: 07/13/2017   HPI:  Abdominal Pain:  - pain in the upper abdomen started Tuesday. Describes as an achy pain that does radiate to the left scapula. Pain is constant. She had two episodes of nonbilious and nonbloody emesis. She has nausea. She has not been able to have any solid foods but has been tolerating fluids. She has chronic diarrhea.  Reports she had a fever of 101F on Tuesday and Wednesday. Her symptoms are a little better today.  - she gets some relief if she lay down in bed for on her abdomen and stretches her abdominal muscles - reports she had a similar episode about 2 weeks ago when she went out to drink with her friends. However this resolved within a day. She denies prior episodes. Her chart reports of history of pancreatitis as well.  - per chart review, she was seen for a similar episode by GI on 01/2017.  - she has a history of GERD/recurrent esophageal strictures/nissen fundoplication. She also has chronic diarrhea. She had a colonoscopy in 2018 with random biopsies which were negative for microscopic colitis. Celiac studies were negative. Creon was started by GI for chronic diarrhea which did somewhat improve her stool consistency and frequency.  - denies any urinary symptoms. Denies any history of peptic ulcer disease. No use of NSAIDs. No recent alcohol use.  - no other abdominal surgeries other than the Nissen   ROS: See HPI.  [place]:  PMH: HTN Asthma Barret's Esophaguss DM2 Nephrolithiasis GAD  PHYSICAL EXAM: BP (!) 158/90   Pulse 68   Temp 97.6 F (36.4 C) (Oral)   Ht '5\' 8"'$  (1.727 m)   Wt 244 lb (110.7 kg)   SpO2 98%   BMI 37.10 kg/m  GEN: NAD, nontoxic appearing  HEENT: moist mucous membranes  CV: RRR, no murmurs, rubs, or gallops PULM: CTAB, normal effort ABD: Soft, tenderness in the upper abdomen (RUQ, epigastric and LLQ) without guarding or rebound, negative murphy sign,  bowel sounds were somewhat hypoactive, nondistended, NABS, no organomegaly SKIN: No rash or cyanosis; warm and well-perfused EXTR: No lower extremity edema or calf tenderness PSYCH: Mood and affect euthymic, normal rate and volume of speech NEURO: Awake, alert, no focal deficits grossly, normal speech   ASSESSMENT/PLAN:  Acute bilateral upper abdominal pain Vitals are stable, afebrile and patient is nontoxic appearing. Etiology is not clear currently. Considered pancreatitis as apparently she does have a history of pancreatitis. Considered PUD but unlikely as symptoms were more acute onset and not very gradual. Considered Cholecystitis. Considered gastroenteritis. Unlikely cardiac related. Will obtain blood work for further work up. Symptomatic treatment for now with Zofran and Tramadol (she has at home); encouraged continued PO hydration and trying bland foods. WIll obtain an xray of abdomen as her bowel sounds were hypoactive (less likely this is a bowel obstruction). Will call patient after obtaining results of work up.  - CBC with Differential/Platelet - CMP14+EGFR - Lipase - DG Abd 2 Views; Future  Leslie Houseman, MD PGY [place] ]

## 2017-07-13 NOTE — Patient Instructions (Signed)
Let's get some blood work today  GO to [facility] to get your abdominal xray  You can use ZOfran as needed for nausea You can use Tramadol and Tylenol as needed for pain Stay hydrated with fluids Try to only eat bland foods (foods not with high fat content)

## 2017-07-14 ENCOUNTER — Telehealth: Payer: Self-pay | Admitting: Internal Medicine

## 2017-07-14 ENCOUNTER — Ambulatory Visit: Payer: No Typology Code available for payment source | Admitting: Physical Therapy

## 2017-07-14 DIAGNOSIS — R7401 Elevation of levels of liver transaminase levels: Secondary | ICD-10-CM

## 2017-07-14 DIAGNOSIS — R740 Nonspecific elevation of levels of transaminase and lactic acid dehydrogenase [LDH]: Principal | ICD-10-CM

## 2017-07-14 LAB — CBC WITH DIFFERENTIAL/PLATELET
BASOS: 0 %
Basophils Absolute: 0 10*3/uL (ref 0.0–0.2)
EOS (ABSOLUTE): 0.1 10*3/uL (ref 0.0–0.4)
EOS: 4 %
HEMATOCRIT: 37.2 % (ref 34.0–46.6)
Hemoglobin: 11.8 g/dL (ref 11.1–15.9)
IMMATURE GRANS (ABS): 0 10*3/uL (ref 0.0–0.1)
IMMATURE GRANULOCYTES: 0 %
LYMPHS: 44 %
Lymphocytes Absolute: 1.2 10*3/uL (ref 0.7–3.1)
MCH: 26 pg — AB (ref 26.6–33.0)
MCHC: 31.7 g/dL (ref 31.5–35.7)
MCV: 82 fL (ref 79–97)
Monocytes Absolute: 0.1 10*3/uL (ref 0.1–0.9)
Monocytes: 5 %
NEUTROS ABS: 1.3 10*3/uL — AB (ref 1.4–7.0)
Neutrophils: 47 %
Platelets: 205 10*3/uL (ref 150–379)
RBC: 4.53 x10E6/uL (ref 3.77–5.28)
RDW: 13.9 % (ref 12.3–15.4)
WBC: 2.8 10*3/uL — ABNORMAL LOW (ref 3.4–10.8)

## 2017-07-14 LAB — CMP14+EGFR
A/G RATIO: 1.6 (ref 1.2–2.2)
ALBUMIN: 4.4 g/dL (ref 3.5–5.5)
ALT: 437 IU/L — ABNORMAL HIGH (ref 0–32)
AST: 492 IU/L — ABNORMAL HIGH (ref 0–40)
Alkaline Phosphatase: 318 IU/L — ABNORMAL HIGH (ref 39–117)
BUN / CREAT RATIO: 5 — AB (ref 9–23)
BUN: 4 mg/dL — ABNORMAL LOW (ref 6–24)
Bilirubin Total: 3.2 mg/dL — ABNORMAL HIGH (ref 0.0–1.2)
CO2: 24 mmol/L (ref 20–29)
Calcium: 9.2 mg/dL (ref 8.7–10.2)
Chloride: 104 mmol/L (ref 96–106)
Creatinine, Ser: 0.81 mg/dL (ref 0.57–1.00)
GFR calc non Af Amer: 84 mL/min/{1.73_m2} (ref >59)
GFR, EST AFRICAN AMERICAN: 97 mL/min/{1.73_m2} (ref >59)
GLOBULIN, TOTAL: 2.8 g/dL (ref 1.5–4.5)
Glucose: 160 mg/dL — ABNORMAL HIGH (ref 65–99)
POTASSIUM: 4 mmol/L (ref 3.5–5.2)
SODIUM: 142 mmol/L (ref 134–144)
TOTAL PROTEIN: 7.2 g/dL (ref 6.0–8.5)

## 2017-07-14 LAB — LIPASE: Lipase: 50 U/L (ref 14–72)

## 2017-07-14 MED ORDER — METRONIDAZOLE 500 MG PO TABS: 500.0000 mg | ORAL_TABLET | Freq: Three times a day (TID) | ORAL | 0 refills | Status: DC

## 2017-07-14 MED ORDER — CIPROFLOXACIN HCL 500 MG PO TABS: 500.0000 mg | ORAL_TABLET | Freq: Two times a day (BID) | ORAL | 0 refills | Status: DC

## 2017-07-14 MED FILL — CIPROFLOXACIN HCL 500 MG TA: 500 | 7 days supply | Qty: 14 | Fill #0

## 2017-07-14 MED FILL — metroNIDAZOLE 500 MG TABS: 500 | 7 days supply | Qty: 21 | Fill #0

## 2017-07-14 NOTE — Telephone Encounter (Signed)
Called patient to inform of lab results with transaminitis with elevated bilirubin. Discussed with preceptor about management. Her vitals were stable and she is doing okay today (not worse), therefore we will proceed with outpatient work up.  1. Will start her on Ciprofloxacin 500mg  BID and Flagyl 500mg  TID for possible intra-abdominal infection  2. Order RUQ [place] as soon as possible  3. Repeat BMP and obtain Hepatitis panel at lab visit on Monday (patient is aware that she needs to make a lab visit).  4. If symptoms worsen in any way over the weekend, she is to go to the ED for evaluation.    Blue team, please order RUQ [place] for patient (today if possible but really need this by Monday). When you call to inform patient about appointment, please remind her to make a lab visit for Monday.

## 2017-07-14 NOTE — Telephone Encounter (Signed)
Ultrasound scheduled for 07-17-17 at [place].  Patient aware. Roosevelt Eimers,CMA

## 2017-07-17 ENCOUNTER — Other Ambulatory Visit: Payer: No Typology Code available for payment source

## 2017-07-17 ENCOUNTER — Telehealth: Payer: Self-pay | Admitting: Internal Medicine

## 2017-07-17 ENCOUNTER — Ambulatory Visit (HOSPITAL_COMMUNITY)
Admission: RE | Admit: 2017-07-17 | Discharge: 2017-07-17 | Disposition: A | Discharge status: Home / Self Care | Payer: No Typology Code available for payment source | Source: Ambulatory Visit | Attending: Family Medicine | Admitting: Family Medicine

## 2017-07-17 DIAGNOSIS — R7401 Elevation of levels of liver transaminase levels: Secondary | ICD-10-CM

## 2017-07-17 DIAGNOSIS — R740 Nonspecific elevation of levels of transaminase and lactic acid dehydrogenase [LDH]: Secondary | ICD-10-CM | POA: Insufficient documentation

## 2017-07-17 DIAGNOSIS — K838 Other specified diseases of biliary tract: Secondary | ICD-10-CM | POA: Insufficient documentation

## 2017-07-17 DIAGNOSIS — R932 Abnormal findings on diagnostic imaging of liver and biliary tract: Secondary | ICD-10-CM | POA: Insufficient documentation

## 2017-07-17 DIAGNOSIS — R945 Abnormal results of liver function studies: Secondary | ICD-10-CM

## 2017-07-17 DIAGNOSIS — K8020 Calculus of gallbladder without cholecystitis without obstruction: Secondary | ICD-10-CM | POA: Diagnosis not present

## 2017-07-17 NOTE — Telephone Encounter (Signed)
Called patient to inform of RUQ [place]. Will go ahead and proceed with MRCP (w and w/o contrast per radiology). Of note patient's abdominal pain has improved. Should still proceed with imaging to figure out etiology. She did come in and get labwork today.   Blue Team, please schedule MRCP for patient, again should be as soon as possible hopefully in the next few days.

## 2017-07-17 NOTE — Telephone Encounter (Signed)
Pt contacted and informed of MRI date and time: 4/24 @8am  [facility]. Nothing by mouth 6 hours prior.

## 2017-07-18 ENCOUNTER — Encounter: Payer: Self-pay | Admitting: Internal Medicine

## 2017-07-18 ENCOUNTER — Telehealth: Payer: Self-pay | Admitting: Gastroenterology

## 2017-07-18 LAB — CMP14+EGFR
ALT: 211 IU/L — ABNORMAL HIGH (ref 0–32)
AST: 109 IU/L — ABNORMAL HIGH (ref 0–40)
Albumin/Globulin Ratio: 1.4 (ref 1.2–2.2)
Albumin: 4.2 g/dL (ref 3.5–5.5)
Alkaline Phosphatase: 261 IU/L — ABNORMAL HIGH (ref 39–117)
BILIRUBIN TOTAL: 1.2 mg/dL (ref 0.0–1.2)
BUN / CREAT RATIO: 16 (ref 9–23)
BUN: 12 mg/dL (ref 6–24)
CALCIUM: 9.5 mg/dL (ref 8.7–10.2)
CHLORIDE: 104 mmol/L (ref 96–106)
CO2: 25 mmol/L (ref 20–29)
Creatinine, Ser: 0.75 mg/dL (ref 0.57–1.00)
GFR, EST AFRICAN AMERICAN: 106 mL/min/{1.73_m2} (ref >59)
GFR, EST NON AFRICAN AMERICAN: 92 mL/min/{1.73_m2} (ref >59)
GLUCOSE: 101 mg/dL — AB (ref 65–99)
Globulin, Total: 2.9 g/dL (ref 1.5–4.5)
Potassium: 3.9 mmol/L (ref 3.5–5.2)
Sodium: 143 mmol/L (ref 134–144)
TOTAL PROTEIN: 7.1 g/dL (ref 6.0–8.5)

## 2017-07-18 LAB — HEPATITIS PANEL, ACUTE
HEP A IGM: NEGATIVE
Hep B C IgM: NEGATIVE
Hep C Virus Ab: <0.1 s/co ratio (ref 0.0–0.9)
Hepatitis B Surface Ag: NEGATIVE

## 2017-07-18 NOTE — Telephone Encounter (Signed)
Patient states she had a gallbladder attack and had to have labs and an US done yesterday 4.15.19. Patient wants to know if Dr.Nandigam can suggest what pt should do next for gi problems. [place] and lab results in [place] per pt.

## 2017-07-19 ENCOUNTER — Encounter: Payer: No Typology Code available for payment source | Admitting: Physical Therapy

## 2017-07-19 ENCOUNTER — Ambulatory Visit: Payer: No Typology Code available for payment source | Admitting: Family Medicine

## 2017-07-19 ENCOUNTER — Observation Stay (HOSPITAL_COMMUNITY)
Admission: EM | Admit: 2017-07-19 | Discharge: 2017-07-21 | Disposition: A | Discharge status: Home / Self Care | Payer: No Typology Code available for payment source | Attending: General Surgery | Admitting: General Surgery

## 2017-07-19 DIAGNOSIS — J45909 Unspecified asthma, uncomplicated: Secondary | ICD-10-CM | POA: Diagnosis not present

## 2017-07-19 DIAGNOSIS — Z8541 Personal history of malignant neoplasm of cervix uteri: Secondary | ICD-10-CM | POA: Diagnosis not present

## 2017-07-19 DIAGNOSIS — Z419 Encounter for procedure for purposes other than remedying health state, unspecified: Secondary | ICD-10-CM

## 2017-07-19 DIAGNOSIS — D649 Anemia, unspecified: Secondary | ICD-10-CM | POA: Insufficient documentation

## 2017-07-19 DIAGNOSIS — E876 Hypokalemia: Secondary | ICD-10-CM | POA: Insufficient documentation

## 2017-07-19 DIAGNOSIS — Z8679 Personal history of other diseases of the circulatory system: Secondary | ICD-10-CM

## 2017-07-19 DIAGNOSIS — E1169 Type 2 diabetes mellitus with other specified complication: Secondary | ICD-10-CM

## 2017-07-19 DIAGNOSIS — K219 Gastro-esophageal reflux disease without esophagitis: Secondary | ICD-10-CM | POA: Insufficient documentation

## 2017-07-19 DIAGNOSIS — E1159 Type 2 diabetes mellitus with other circulatory complications: Secondary | ICD-10-CM

## 2017-07-19 DIAGNOSIS — Z6836 Body mass index (BMI) 36.0-36.9, adult: Secondary | ICD-10-CM | POA: Diagnosis not present

## 2017-07-19 DIAGNOSIS — F329 Major depressive disorder, single episode, unspecified: Secondary | ICD-10-CM | POA: Insufficient documentation

## 2017-07-19 DIAGNOSIS — G4730 Sleep apnea, unspecified: Secondary | ICD-10-CM | POA: Insufficient documentation

## 2017-07-19 DIAGNOSIS — Z96652 Presence of left artificial knee joint: Secondary | ICD-10-CM | POA: Insufficient documentation

## 2017-07-19 DIAGNOSIS — I2510 Atherosclerotic heart disease of native coronary artery without angina pectoris: Secondary | ICD-10-CM | POA: Insufficient documentation

## 2017-07-19 DIAGNOSIS — Z79899 Other long term (current) drug therapy: Secondary | ICD-10-CM | POA: Diagnosis not present

## 2017-07-19 DIAGNOSIS — E119 Type 2 diabetes mellitus without complications: Secondary | ICD-10-CM | POA: Insufficient documentation

## 2017-07-19 DIAGNOSIS — E669 Obesity, unspecified: Secondary | ICD-10-CM | POA: Diagnosis not present

## 2017-07-19 DIAGNOSIS — R1011 Right upper quadrant pain: Secondary | ICD-10-CM | POA: Diagnosis present

## 2017-07-19 DIAGNOSIS — K8010 Calculus of gallbladder with chronic cholecystitis without obstruction: Secondary | ICD-10-CM | POA: Diagnosis not present

## 2017-07-19 DIAGNOSIS — K831 Obstruction of bile duct: Secondary | ICD-10-CM

## 2017-07-19 DIAGNOSIS — I10 Essential (primary) hypertension: Secondary | ICD-10-CM | POA: Diagnosis not present

## 2017-07-19 DIAGNOSIS — R1310 Dysphagia, unspecified: Secondary | ICD-10-CM

## 2017-07-19 DIAGNOSIS — K810 Acute cholecystitis: Secondary | ICD-10-CM

## 2017-07-19 NOTE — Telephone Encounter (Signed)
Patient advised. Offered an appointment for after her MRI. She states she will wait until after the MRI is done.

## 2017-07-19 NOTE — Telephone Encounter (Signed)
Patient has had abdominal pain, clay colored stool and dark urine by her report. Her PCP had ordered the u/s and is moving forward with MRI. The patient is asking your opinion. States abdominal pain is off and on. She is on Creon per her statement.

## 2017-07-19 NOTE — Telephone Encounter (Signed)
Agree with MRCP. Further follow up based on MRI results.

## 2017-07-20 ENCOUNTER — Other Ambulatory Visit: Payer: Self-pay

## 2017-07-20 ENCOUNTER — Encounter (HOSPITAL_COMMUNITY)
Admission: EM | Disposition: A | Discharge status: Home / Self Care | Payer: Self-pay | Source: Home / Self Care | Attending: Emergency Medicine

## 2017-07-20 ENCOUNTER — Observation Stay (HOSPITAL_COMMUNITY): Payer: No Typology Code available for payment source

## 2017-07-20 ENCOUNTER — Emergency Department (HOSPITAL_COMMUNITY): Payer: No Typology Code available for payment source

## 2017-07-20 ENCOUNTER — Observation Stay (HOSPITAL_COMMUNITY): Payer: No Typology Code available for payment source | Admitting: Anesthesiology

## 2017-07-20 ENCOUNTER — Encounter (HOSPITAL_COMMUNITY): Payer: Self-pay | Admitting: Emergency Medicine

## 2017-07-20 DIAGNOSIS — E119 Type 2 diabetes mellitus without complications: Secondary | ICD-10-CM | POA: Diagnosis not present

## 2017-07-20 DIAGNOSIS — K8000 Calculus of gallbladder with acute cholecystitis without obstruction: Secondary | ICD-10-CM | POA: Diagnosis not present

## 2017-07-20 DIAGNOSIS — E1159 Type 2 diabetes mellitus with other circulatory complications: Secondary | ICD-10-CM

## 2017-07-20 DIAGNOSIS — K810 Acute cholecystitis: Secondary | ICD-10-CM | POA: Diagnosis not present

## 2017-07-20 DIAGNOSIS — R1310 Dysphagia, unspecified: Secondary | ICD-10-CM

## 2017-07-20 DIAGNOSIS — E1169 Type 2 diabetes mellitus with other specified complication: Secondary | ICD-10-CM

## 2017-07-20 DIAGNOSIS — E669 Obesity, unspecified: Secondary | ICD-10-CM | POA: Diagnosis not present

## 2017-07-20 DIAGNOSIS — Z8679 Personal history of other diseases of the circulatory system: Secondary | ICD-10-CM | POA: Diagnosis not present

## 2017-07-20 DIAGNOSIS — I10 Essential (primary) hypertension: Secondary | ICD-10-CM | POA: Diagnosis not present

## 2017-07-20 DIAGNOSIS — I152 Hypertension secondary to endocrine disorders: Secondary | ICD-10-CM

## 2017-07-20 DIAGNOSIS — K8010 Calculus of gallbladder with chronic cholecystitis without obstruction: Secondary | ICD-10-CM | POA: Diagnosis present

## 2017-07-20 HISTORY — PX: CHOLECYSTECTOMY: SHX55

## 2017-07-20 LAB — I-STAT BETA HCG BLOOD, ED (MC, WL, AP ONLY): I-stat hCG, quantitative: <5 m[IU]/mL (ref <5)

## 2017-07-20 LAB — COMPREHENSIVE METABOLIC PANEL
ALK PHOS: 303 U/L — AB (ref 38–126)
ALT: 171 U/L — ABNORMAL HIGH (ref 14–54)
ANION GAP: 8 (ref 5–15)
AST: 236 U/L — ABNORMAL HIGH (ref 15–41)
Albumin: 3.6 g/dL (ref 3.5–5.0)
BUN: 10 mg/dL (ref 6–20)
CO2: 25 mmol/L (ref 22–32)
Calcium: 9.3 mg/dL (ref 8.9–10.3)
Chloride: 108 mmol/L (ref 101–111)
Creatinine, Ser: 0.72 mg/dL (ref 0.44–1.00)
GFR calc non Af Amer: >60 mL/min (ref >60)
Glucose, Bld: 128 mg/dL — ABNORMAL HIGH (ref 65–99)
POTASSIUM: 3.4 mmol/L — AB (ref 3.5–5.1)
SODIUM: 141 mmol/L (ref 135–145)
TOTAL PROTEIN: 7.7 g/dL (ref 6.5–8.1)
Total Bilirubin: 1.5 mg/dL — ABNORMAL HIGH (ref 0.3–1.2)

## 2017-07-20 LAB — CBC
HCT: 35 % — ABNORMAL LOW (ref 36.0–46.0)
Hemoglobin: 11 g/dL — ABNORMAL LOW (ref 12.0–15.0)
MCH: 26.4 pg (ref 26.0–34.0)
MCHC: 31.4 g/dL (ref 30.0–36.0)
MCV: 84.1 fL (ref 78.0–100.0)
PLATELETS: 196 10*3/uL (ref 150–400)
RBC: 4.16 MIL/uL (ref 3.87–5.11)
RDW: 13.6 % (ref 11.5–15.5)
WBC: 4.5 10*3/uL (ref 4.0–10.5)

## 2017-07-20 LAB — URINALYSIS, ROUTINE W REFLEX MICROSCOPIC
BILIRUBIN URINE: NEGATIVE
Bacteria, UA: NONE SEEN
GLUCOSE, UA: NEGATIVE mg/dL
KETONES UR: NEGATIVE mg/dL
LEUKOCYTES UA: NEGATIVE
NITRITE: NEGATIVE
PH: 6 (ref 5.0–8.0)
Protein, ur: NEGATIVE mg/dL
SPECIFIC GRAVITY, URINE: 1.018 (ref 1.005–1.030)

## 2017-07-20 LAB — POC URINE PREG, ED: Preg Test, Ur: NEGATIVE

## 2017-07-20 LAB — GLUCOSE, CAPILLARY
GLUCOSE-CAPILLARY: 113 mg/dL — AB (ref 65–99)
Glucose-Capillary: 86 mg/dL (ref 65–99)
Glucose-Capillary: 207 mg/dL — ABNORMAL HIGH (ref 65–99)

## 2017-07-20 LAB — LIPASE, BLOOD: Lipase: 37 U/L (ref 11–51)

## 2017-07-20 LAB — CBG MONITORING, ED: Glucose-Capillary: 84 mg/dL (ref 65–99)

## 2017-07-20 SURGERY — LAPAROSCOPIC CHOLECYSTECTOMY WITH INTRAOPERATIVE CHOLANGIOGRAM
Anesthesia: General

## 2017-07-20 MED ORDER — LORATADINE 10 MG PO TABS: 10.0000 mg | ORAL_TABLET | Freq: Every day | ORAL | Status: DC

## 2017-07-20 MED ORDER — BUPIVACAINE HCL (PF) 0.25 % IJ SOLN
INTRAMUSCULAR | Status: AC
  Filled 2017-07-20: qty 30

## 2017-07-20 MED ORDER — ONDANSETRON HCL 4 MG/2ML IJ SOLN
INTRAMUSCULAR | Status: AC
  Filled 2017-07-20: qty 2

## 2017-07-20 MED ORDER — IOPAMIDOL (ISOVUE-300) INJECTION 61%
INTRAVENOUS | Status: AC
  Filled 2017-07-20: qty 50

## 2017-07-20 MED ORDER — MIDAZOLAM HCL 5 MG/5ML IJ SOLN
INTRAMUSCULAR | Status: DC | PRN
  Administered 2017-07-20: 2 mg via INTRAVENOUS

## 2017-07-20 MED ORDER — LABETALOL HCL 5 MG/ML IV SOLN
10.0000 mg | Freq: Once | INTRAVENOUS | Status: AC
  Administered 2017-07-20: 10 mg via INTRAVENOUS

## 2017-07-20 MED ORDER — IOPAMIDOL (ISOVUE-300) INJECTION 61%
INTRAVENOUS | Status: DC | PRN
Start: 2017-07-20 — End: 2017-07-20
  Administered 2017-07-20: 5 mL

## 2017-07-20 MED ORDER — ONDANSETRON HCL 4 MG/2ML IJ SOLN
4.0000 mg | Freq: Four times a day (QID) | INTRAMUSCULAR | Status: DC | PRN
  Administered 2017-07-20: 4 mg via INTRAVENOUS

## 2017-07-20 MED ORDER — CIPROFLOXACIN IN D5W 400 MG/200ML IV SOLN
INTRAVENOUS | Status: AC
  Filled 2017-07-20: qty 200

## 2017-07-20 MED ORDER — LACTATED RINGERS IV SOLN
INTRAVENOUS | Status: DC | PRN
  Administered 2017-07-20 (×2): via INTRAVENOUS

## 2017-07-20 MED ORDER — FENTANYL CITRATE (PF) 100 MCG/2ML IJ SOLN
INTRAMUSCULAR | Status: DC | PRN
  Administered 2017-07-20: 50 ug via INTRAVENOUS
  Administered 2017-07-20: 100 ug via INTRAVENOUS

## 2017-07-20 MED ORDER — POTASSIUM CHLORIDE 10 MEQ/100ML IV SOLN
10.0000 meq | INTRAVENOUS | Status: AC
  Administered 2017-07-20 (×3): 10 meq via INTRAVENOUS
  Filled 2017-07-20 (×2): qty 100

## 2017-07-20 MED ORDER — IBUPROFEN 200 MG PO TABS: 600.0000 mg | ORAL_TABLET | Freq: Four times a day (QID) | ORAL | Status: DC | PRN

## 2017-07-20 MED ORDER — CIPROFLOXACIN IN D5W 400 MG/200ML IV SOLN
400.0000 mg | Freq: Two times a day (BID) | INTRAVENOUS | Status: DC
  Administered 2017-07-20: 400 mg via INTRAVENOUS

## 2017-07-20 MED ORDER — SUGAMMADEX SODIUM 500 MG/5ML IV SOLN
INTRAVENOUS | Status: DC | PRN
  Administered 2017-07-20: 300 mg via INTRAVENOUS

## 2017-07-20 MED ORDER — SUCCINYLCHOLINE CHLORIDE 200 MG/10ML IV SOSY
PREFILLED_SYRINGE | INTRAVENOUS | Status: DC | PRN
  Administered 2017-07-20: 140 mg via INTRAVENOUS

## 2017-07-20 MED ORDER — POTASSIUM CHLORIDE 10 MEQ/100ML IV SOLN
10.0000 meq | INTRAVENOUS | Status: AC
  Administered 2017-07-20: 10 meq via INTRAVENOUS
  Filled 2017-07-20 (×2): qty 100

## 2017-07-20 MED ORDER — DIPHENHYDRAMINE HCL 25 MG PO CAPS: 25.0000 mg | ORAL_CAPSULE | Freq: Four times a day (QID) | ORAL | Status: DC | PRN

## 2017-07-20 MED ORDER — LACTATED RINGERS IR SOLN
Status: DC | PRN
  Administered 2017-07-20: 1000 mL

## 2017-07-20 MED ORDER — SUGAMMADEX SODIUM 200 MG/2ML IV SOLN
INTRAVENOUS | Status: AC
  Filled 2017-07-20: qty 2

## 2017-07-20 MED ORDER — GADOBENATE DIMEGLUMINE 529 MG/ML IV SOLN
20.0000 mL | Freq: Once | INTRAVENOUS | Status: AC | PRN
  Administered 2017-07-20: 20 mL via INTRAVENOUS

## 2017-07-20 MED ORDER — PANCRELIPASE (LIP-PROT-AMYL) 12000-38000 UNITS PO CPEP: 36000.0000 [IU] | ORAL_CAPSULE | ORAL | Status: DC | PRN

## 2017-07-20 MED ORDER — LIDOCAINE 2% (20 MG/ML) 5 ML SYRINGE
INTRAMUSCULAR | Status: DC | PRN
  Administered 2017-07-20: 60 mg via INTRAVENOUS

## 2017-07-20 MED ORDER — INSULIN ASPART 100 UNIT/ML ~~LOC~~ SOLN
0.0000 [IU] | SUBCUTANEOUS | Status: DC
  Administered 2017-07-20: 3 [IU] via SUBCUTANEOUS
  Administered 2017-07-21: 2 [IU] via SUBCUTANEOUS

## 2017-07-20 MED ORDER — MORPHINE SULFATE (PF) 2 MG/ML IV SOLN: 2.0000 mg | INTRAVENOUS | Status: DC | PRN

## 2017-07-20 MED ORDER — PROPOFOL 10 MG/ML IV BOLUS
INTRAVENOUS | Status: DC | PRN
  Administered 2017-07-20: 150 mg via INTRAVENOUS

## 2017-07-20 MED ORDER — FENTANYL CITRATE (PF) 100 MCG/2ML IJ SOLN
25.0000 ug | INTRAMUSCULAR | Status: DC | PRN
  Administered 2017-07-20 (×2): 50 ug via INTRAVENOUS

## 2017-07-20 MED ORDER — FENTANYL CITRATE (PF) 250 MCG/5ML IJ SOLN
INTRAMUSCULAR | Status: AC
  Filled 2017-07-20: qty 5

## 2017-07-20 MED ORDER — FENTANYL CITRATE (PF) 100 MCG/2ML IJ SOLN
50.0000 ug | INTRAMUSCULAR | Status: AC | PRN
  Administered 2017-07-20 (×2): 50 ug via INTRAVENOUS
  Filled 2017-07-20 (×2): qty 2

## 2017-07-20 MED ORDER — PANTOPRAZOLE SODIUM 40 MG PO TBEC
40.0000 mg | DELAYED_RELEASE_TABLET | Freq: Every day | ORAL | Status: DC
  Administered 2017-07-20: 40 mg via ORAL
  Filled 2017-07-20: qty 1

## 2017-07-20 MED ORDER — BUPIVACAINE-EPINEPHRINE 0.25% -1:200000 IJ SOLN
INTRAMUSCULAR | Status: DC | PRN
  Administered 2017-07-20: 27 mL

## 2017-07-20 MED ORDER — EPHEDRINE 5 MG/ML INJ
INTRAVENOUS | Status: AC
  Filled 2017-07-20: qty 10

## 2017-07-20 MED ORDER — MIDAZOLAM HCL 2 MG/2ML IJ SOLN
INTRAMUSCULAR | Status: AC
  Filled 2017-07-20: qty 2

## 2017-07-20 MED ORDER — SENNOSIDES-DOCUSATE SODIUM 8.6-50 MG PO TABS
1.0000 | ORAL_TABLET | Freq: Two times a day (BID) | ORAL | Status: DC
  Administered 2017-07-20: 1 via ORAL
  Filled 2017-07-20: qty 1

## 2017-07-20 MED ORDER — FENTANYL CITRATE (PF) 100 MCG/2ML IJ SOLN
INTRAMUSCULAR | Status: AC
  Filled 2017-07-20: qty 2

## 2017-07-20 MED ORDER — DEXAMETHASONE SODIUM PHOSPHATE 10 MG/ML IJ SOLN
INTRAMUSCULAR | Status: AC
  Filled 2017-07-20: qty 1

## 2017-07-20 MED ORDER — CIPROFLOXACIN IN D5W 400 MG/200ML IV SOLN
400.0000 mg | Freq: Two times a day (BID) | INTRAVENOUS | Status: DC
  Administered 2017-07-21: 400 mg via INTRAVENOUS
  Filled 2017-07-20: qty 200

## 2017-07-20 MED ORDER — TRAMADOL HCL 50 MG PO TABS: 100.0000 mg | ORAL_TABLET | Freq: Two times a day (BID) | ORAL | Status: DC | PRN

## 2017-07-20 MED ORDER — MAGNESIUM SULFATE 2 GM/50ML IV SOLN
2.0000 g | Freq: Once | INTRAVENOUS | Status: AC
  Administered 2017-07-20: 2 g via INTRAVENOUS
  Filled 2017-07-20: qty 50

## 2017-07-20 MED ORDER — ROCURONIUM BROMIDE 10 MG/ML (PF) SYRINGE
PREFILLED_SYRINGE | INTRAVENOUS | Status: DC | PRN
  Administered 2017-07-20: 30 mg via INTRAVENOUS

## 2017-07-20 MED ORDER — DIPHENHYDRAMINE HCL 50 MG/ML IJ SOLN: 25.0000 mg | Freq: Four times a day (QID) | INTRAMUSCULAR | Status: DC | PRN

## 2017-07-20 MED ORDER — DEXAMETHASONE SODIUM PHOSPHATE 10 MG/ML IJ SOLN
INTRAMUSCULAR | Status: DC | PRN
  Administered 2017-07-20: 10 mg via INTRAVENOUS

## 2017-07-20 MED ORDER — ACETAMINOPHEN 500 MG PO TABS
1000.0000 mg | ORAL_TABLET | Freq: Three times a day (TID) | ORAL | Status: DC
  Administered 2017-07-20 – 2017-07-21 (×2): 1000 mg via ORAL
  Filled 2017-07-20 (×2): qty 2

## 2017-07-20 MED ORDER — LABETALOL HCL 5 MG/ML IV SOLN
INTRAVENOUS | Status: AC
  Filled 2017-07-20: qty 4

## 2017-07-20 MED ORDER — SERTRALINE HCL 100 MG PO TABS: 100.0000 mg | ORAL_TABLET | Freq: Every day | ORAL | Status: DC

## 2017-07-20 MED ORDER — HYDRALAZINE HCL 20 MG/ML IJ SOLN
10.0000 mg | Freq: Four times a day (QID) | INTRAMUSCULAR | Status: DC | PRN
Start: 2017-07-20 — End: 2017-07-21
  Administered 2017-07-20: 10 mg via INTRAVENOUS
  Filled 2017-07-20: qty 1

## 2017-07-20 MED ORDER — PANCRELIPASE (LIP-PROT-AMYL) 36000-114000 UNITS PO CPEP
72000.0000 [IU] | ORAL_CAPSULE | Freq: Three times a day (TID) | ORAL | Status: DC
  Administered 2017-07-21: 72000 [IU] via ORAL
  Filled 2017-07-20 (×2): qty 2

## 2017-07-20 MED ORDER — ONDANSETRON HCL 4 MG/2ML IJ SOLN
4.0000 mg | Freq: Once | INTRAMUSCULAR | Status: DC | PRN
  Filled 2017-07-20: qty 2

## 2017-07-20 MED ORDER — ALBUTEROL SULFATE (2.5 MG/3ML) 0.083% IN NEBU: 3.0000 mL | INHALATION_SOLUTION | Freq: Four times a day (QID) | RESPIRATORY_TRACT | Status: DC | PRN

## 2017-07-20 MED ORDER — BISACODYL 5 MG PO TBEC: 5.0000 mg | DELAYED_RELEASE_TABLET | Freq: Every day | ORAL | Status: DC | PRN

## 2017-07-20 MED ORDER — ONDANSETRON HCL 4 MG/2ML IJ SOLN
INTRAMUSCULAR | Status: DC | PRN
  Administered 2017-07-20: 4 mg via INTRAVENOUS

## 2017-07-20 MED ORDER — POTASSIUM CHLORIDE 2 MEQ/ML IV SOLN
INTRAVENOUS | Status: DC
  Administered 2017-07-20 – 2017-07-21 (×2): via INTRAVENOUS
  Filled 2017-07-20 (×4): qty 1000

## 2017-07-20 MED ORDER — EPHEDRINE SULFATE-NACL 50-0.9 MG/10ML-% IV SOSY
PREFILLED_SYRINGE | INTRAVENOUS | Status: DC | PRN
  Administered 2017-07-20: 15 mg via INTRAVENOUS

## 2017-07-20 MED ORDER — ONDANSETRON 4 MG PO TBDP: 4.0000 mg | ORAL_TABLET | Freq: Four times a day (QID) | ORAL | Status: DC | PRN

## 2017-07-20 SURGICAL SUPPLY — 33 items
APPLIER CLIP ROT 10 11.4 M/L (STAPLE) ×2
CABLE HIGH FREQUENCY MONO STRZ (ELECTRODE) ×2 IMPLANT
CATH REDDICK CHOLANGI 4FR 50CM (CATHETERS) IMPLANT
CHLORAPREP W/TINT 26ML (MISCELLANEOUS) ×2 IMPLANT
CLIP APPLIE ROT 10 11.4 M/L (STAPLE) ×1 IMPLANT
CONT SPEC 4OZ CLIKSEAL STRL BL (MISCELLANEOUS) ×2 IMPLANT
COVER MAYO STAND STRL (DRAPES) ×2 IMPLANT
COVER SURGICAL LIGHT HANDLE (MISCELLANEOUS) ×2 IMPLANT
DECANTER SPIKE VIAL GLASS SM (MISCELLANEOUS) ×2 IMPLANT
DERMABOND ADVANCED (GAUZE/BANDAGES/DRESSINGS) ×1
DERMABOND ADVANCED .7 DNX12 (GAUZE/BANDAGES/DRESSINGS) ×1 IMPLANT
DRAPE C-ARM 42X120 X-RAY (DRAPES) ×2 IMPLANT
ELECT REM PT RETURN 15FT ADLT (MISCELLANEOUS) ×2 IMPLANT
GLOVE BIOGEL PI IND STRL 7.5 (GLOVE) ×6 IMPLANT
GLOVE BIOGEL PI INDICATOR 7.5 (GLOVE) ×6
GLOVE ECLIPSE 7.5 STRL STRAW (GLOVE) ×2 IMPLANT
GOWN STRL REUS W/TWL XL LVL3 (GOWN DISPOSABLE) ×6 IMPLANT
HEMOSTAT SNOW SURGICEL 2X4 (HEMOSTASIS) IMPLANT
HEMOSTAT SURGICEL 4X8 (HEMOSTASIS) IMPLANT
KIT BASIN OR (CUSTOM PROCEDURE TRAY) ×2 IMPLANT
POUCH RETRIEVAL ECOSAC 10 (ENDOMECHANICALS) IMPLANT
POUCH RETRIEVAL ECOSAC 10MM (ENDOMECHANICALS)
SCISSORS LAP 5X35 DISP (ENDOMECHANICALS) ×2 IMPLANT
SET CHOLANGIOGRAPH MIX (MISCELLANEOUS) ×2 IMPLANT
SET IRRIG TUBING LAPAROSCOPIC (IRRIGATION / IRRIGATOR) ×2 IMPLANT
SLEEVE XCEL OPT CAN 5 100 (ENDOMECHANICALS) ×2 IMPLANT
SUT MNCRL AB 4-0 PS2 18 (SUTURE) ×2 IMPLANT
TOWEL OR 17X26 10 PK STRL BLUE (TOWEL DISPOSABLE) ×2 IMPLANT
TRAY LAPAROSCOPIC (CUSTOM PROCEDURE TRAY) ×2 IMPLANT
TROCAR BLADELESS OPT 5 100 (ENDOMECHANICALS) ×2 IMPLANT
TROCAR XCEL BLUNT TIP 100MML (ENDOMECHANICALS) ×2 IMPLANT
TROCAR XCEL NON-BLD 11X100MML (ENDOMECHANICALS) ×2 IMPLANT
TUBING INSUF HEATED (TUBING) ×2 IMPLANT

## 2017-07-20 NOTE — Op Note (Signed)
Preoperative diagnosis: Cholelithiasis and cholecystitis  Postoperative diagnosis: Cholelithiasis and cholecystitis  Surgical procedure: Laparoscopic cholecystectomy with intraoperative cholangiogram  Surgeon: Marland Kitchen T. Jachob Mcclean M.D.  Assistant: Will St. Peter PA  Anesthesia: General Endotracheal  Complications: None  Estimated blood loss: Minimal  Description of procedure: The patient brought to the operating room, placed in the supine position on the operating table, and general endotracheal anesthesia induced. The abdomen was widely sterilely prepped and draped. The patient had received preoperative IV antibiotics and PAS were in place. Patient timeout was performed the correct procedure verified. Standard 4 port technique was used with an open Hassan cannula at the umbilicus and the remainder of the ports placed under direct vision. The gallbladder was visualized. It appeared very edematous with early acute inflammation. The fundus was grasped and elevated up over the liver and the infundibulum retracted inferiolaterally. Peritoneum anterior and posterior to close triangle was incised and fibrofatty tissue stripped off the neck of the gallbladder toward the porta hepatis. The distal gallbladder was thoroughly dissected. The cystic artery was identified in Calot's triangle and the cystic duct gallbladder junction dissected 360.  A good critical view was obtained. When the anatomy was clear the cystic duct was clipped at the gallbladder junction and an operative cholangiogram obtained through the cystic duct. This showed good filling of a normal common bile duct and intrahepatic ducts with free flow into the duodenum and no filling defects. Following this the cholangiocath was removed and the cystic duct was doubly clipped proximally and divided. The cystic artery was doubly clipped proximally and distally and divided. The gallbladder was dissected free from its bed using hook cautery and  removed through the umbilical port site. Complete hemostasis was obtained in the gallbladder bed. The right upper quadrant was thoroughly irrigated and hemostasis assured. Trochars were removed and all CO2 evacuated and the [facility] trocar site fascial defect closed. Skin incisions were closed with subcuticular Monocryl and Dermabond. Sponge needle and instrument counts were correct. The patient was taken to PACU in good condition.  Leslie Martinez  07/20/2017

## 2017-07-20 NOTE — Anesthesia Procedure Notes (Signed)
Procedure Name: Intubation Date/Time: 07/20/2017 3:47 PM Performed by: Colin Rhein, CRNA Pre-anesthesia Checklist: Patient identified Patient Re-evaluated:Patient Re-evaluated prior to induction Oxygen Delivery Method: Circle system utilized Preoxygenation: Pre-oxygenation with 100% oxygen Induction Type: IV induction Ventilation: Mask ventilation without difficulty Laryngoscope Size: Mac and 4 Grade View: Grade II Tube type: Oral Tube size: 7.0 mm Number of attempts: 1 Airway Equipment and Method: Patient positioned with wedge pillow Placement Confirmation: ETT inserted through vocal cords under direct vision,  positive ETCO2,  CO2 detector and breath sounds checked- equal and bilateral Secured at: 22 cm Tube secured with: Tape Dental Injury: Teeth and Oropharynx as per pre-operative assessment

## 2017-07-20 NOTE — Transfer of Care (Signed)
Immediate Anesthesia Transfer of Care Note  Patient: Leslie Martinez  Procedure(s) Performed: LAPAROSCOPIC CHOLECYSTECTOMY WITH INTRAOPERATIVE CHOLANGIOGRAM (N/A )  Patient Location: PACU  Anesthesia Type:General  Level of Consciousness: sedated  Airway & Oxygen Therapy: Patient Spontanous Breathing and Patient connected to face mask oxygen  Post-op Assessment: Report given to RN and Post -op Vital signs reviewed and stable  Post vital signs: Reviewed and stable  Last Vitals:  Vitals Value Taken Time  BP 169/101 07/20/2017  5:30 PM  Temp    Pulse 75 07/20/2017  5:37 PM  Resp 13 07/20/2017  5:37 PM  SpO2 100 % 07/20/2017  5:37 PM  Vitals shown include unvalidated device data.  Last Pain:  Vitals:   07/20/17 1720  TempSrc:   PainSc: 0-No pain      Patients Stated Pain Goal: 4 (64/33/29 5188)  Complications: No apparent anesthesia complications

## 2017-07-20 NOTE — Consult Note (Signed)
Came to see pt but she is in OR.  She has no EKG changes.  If cardiology consult is needed post op, for cardiac issue please call back.

## 2017-07-20 NOTE — H&P (Signed)
History and Physical  Leslie Martinez RCV:[id number] DOB: 30-Dec-1965 DOA: 07/19/2017  Referring physician: Dr Vernie Shanks PCP: Carlyle Dolly, MD  Outpatient Specialists: Cardiologist in [place] Patient coming from: Home Chief Complaint: Right upper quadrant abdominal pain  HPI: Leslie Martinez is a 52 y.o. female with medical history significant for Prinzmetal angina, type 2 diabetes, asthma, depression, anxiety, obesity, dysphagia, history of Nissen fundoplication, who presented to [facility] ED with complaints of severe right upper quadrant pain and persistent nausea.  A week ago patient was seen by her GI physician.  Symptoms have been intermittent. Denies fevers or chills. MRCP done on 07/20/2017 revealed cholelithiasis with findings suggestive of acute cholecystitis.  General surgery consulted by ED physician and planning on cholecystectomy.  Cardiology consulted for medical clearance.  Patient taken to the OR prior to being seen by cardiology.  ED Course:  Accelerated HTN which could be related to her pain. EKG personally reviewed and revealed sinus rhythm with rate 59.  Hypokalemia which was repleted with IV potassium chloride due to nausea.  Elevated LFTs.  Review of Systems: Review of systems eyes stated in the HPI.  All other systems reviewed and are negative.   Past Medical History:  Diagnosis Date  . Anxiety   . Asthma    seasonal allergies, dry cough today   . 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 Cervical cancer ([place])    "stage III; discovered S/P hysterectomy" (05/12/2017)  . Chronic bronchitis ([place])   . Depression   . Esophageal stricture   . Esophagitis   . Gastritis   . GERD (gastroesophageal reflux disease)   . Hiatal hernia    history of, treated with surgery  . History of blood transfusion 1990s   "related to childbirth"  . History of kidney stones    passed stones spontaneously, also cystoscopy- done   . Hypertension   . Migraine    "monthly" (05/12/2017)  . Osteoarthritis of  both knees   . Pancreatitis   . Pneumonia 2000s X1  . Pre-diabetes   . Prinzmetal variant angina ([place])    1990's  . Seasonal allergies   . Sleep apnea    Had nasal sx, and uses no cpap   Past Surgical History:  Procedure Laterality Date  . APPENDECTOMY    . CARDIAC CATHETERIZATION  1990's X 1  . COLONOSCOPY    . CYSTOSCOPY W/ STONE MANIPULATION    . DILATION AND CURETTAGE OF UTERUS    . ENDOMETRIAL ABLATION    . ESOPHAGOGASTRODUODENOSCOPY (EGD) WITH ESOPHAGEAL DILATION  "several times"  . HERNIA REPAIR    . JOINT REPLACEMENT    . KIDNEY POLYPS REMOVED    . KNEE ARTHROSCOPY Bilateral   . LAPAROSCOPIC NISSEN FUNDOPLICATION  01/04/5851   Procedure: LAPAROSCOPIC NISSEN FUNDOPLICATION;  Surgeon: Pedro Earls, MD;  Location: [facility];  Service: General;  Laterality: N/A;  . NASAL SEPTUM SURGERY  2000s   "w/adenoids and hyoid bone removed"  . REVISION TOTAL KNEE ARTHROPLASTY Left 05/12/2017  . TONSILLECTOMY  2004   T&A- due to sleep apnea   . TOTAL KNEE ARTHROPLASTY Left 07/13/2015   Procedure: TOTAL KNEE ARTHROPLASTY;  Surgeon: Frederik Pear, MD;  Location: [facility];  Service: Orthopedics;  Laterality: Left;  . TOTAL KNEE REVISION Left 05/12/2017   Procedure: TOTAL KNEE REVISION;  Surgeon: Frederik Pear, MD;  Location: [facility];  Service: Orthopedics;  Laterality: Left;  . TUBAL LIGATION    . UPPER GASTROINTESTINAL ENDOSCOPY    . UPPER GI  ENDOSCOPY  02/03/2012   Procedure: UPPER GI ENDOSCOPY;  Surgeon: Pedro Earls, MD;  Location: [facility];  Service: General;;  . VAGINAL HYSTERECTOMY    . WISDOM TOOTH EXTRACTION      Social History:  reports that she has never smoked. She has never used smokeless tobacco. She reports that she does not drink alcohol or use drugs.   Allergies  Allergen Reactions  . Adhesive [Tape] Hives  . Iodine Hives and Rash  . Lisinopril Cough  . Cephalexin Hives and Rash  . Compazine [Prochlorperazine Edisylate]     Hyperactivity  . Latex Itching  .  Oxycodone-Acetaminophen Itching and Rash    Family History  Problem Relation Age of Onset  . Heart disease Mother   . Heart disease Father   . Diabetes Father   . Colon cancer Maternal Grandmother   . Colon polyps Neg Hx   . Esophageal cancer Neg Hx   . Rectal cancer Neg Hx   . Stomach cancer Neg Hx       Prior to Admission medications   Medication Sig Start Date End Date Taking? Authorizing Provider  albuterol (PROVENTIL HFA;VENTOLIN HFA) 108 (90 BASE) MCG/ACT inhaler Inhale 2 puffs into the lungs every 6 (six) hours as needed for wheezing or shortness of breath.   Yes [provider]  cetirizine (ZYRTEC) 10 MG tablet Take 10 mg by mouth daily as needed for allergies.   Yes [provider]  lipase/protease/amylase (CREON) 36000 UNITS CPEP capsule Take 2 capsules with each meal and 1 capsule with a snack daily Patient taking differently: Take 36,000-72,000 Units by mouth See admin instructions. Take 2 capsules with each meal and 1 capsule with a snack daily 08/25/16  Yes Nandigam, Kavitha V, MD  Melatonin 5 MG TABS Take 5 mg by mouth at bedtime.   Yes [provider]  omeprazole (PRILOSEC) 40 MG capsule Take 1 capsule (40 mg total) by mouth daily. 08/15/16  Yes Nandigam, Venia Minks, MD  oxyCODONE-acetaminophen (PERCOCET/ROXICET) 5-325 MG tablet Take 1 tablet by mouth every 4 (four) hours as needed (for pain.). 05/12/17  Yes Joanell Rising K, PA-C  sertraline (ZOLOFT) 100 MG tablet Take 1 tablet (100 mg total) by mouth daily. 06/30/17  Yes Carlyle Dolly, MD  traMADol (ULTRAM) 50 MG tablet Take by mouth every 6 (six) hours as needed for moderate pain.    Yes [provider]  aspirin EC 325 MG tablet Take 1 tablet (325 mg total) by mouth 2 (two) times daily. Patient not taking: Reported on 07/20/2017 05/12/17   Leighton Parody, PA-C  ciprofloxacin (CIPRO) 500 MG tablet Take 1 tablet (500 mg total) by mouth 2 (two) times daily for 7 days. Patient not  taking: Reported on 07/20/2017 07/14/17 07/21/17  Smiley Houseman, MD  metFORMIN (GLUCOPHAGE) 500 MG tablet Take 1 tablet (500 mg total) by mouth daily with breakfast. Patient not taking: Reported on 07/20/2017 06/30/17   Carlyle Dolly, MD  metroNIDAZOLE (FLAGYL) 500 MG tablet Take 1 tablet (500 mg total) by mouth 3 (three) times daily for 7 days. Patient not taking: Reported on 07/20/2017 07/14/17 07/21/17  Smiley Houseman, MD    Physical Exam: BP (!) 161/81 (BP Location: Right Arm)   Pulse 66   Temp 98.6 F (37 C) (Oral)   Resp 16   Ht 5\' 9"  (1.753 m)   Wt 110.7 kg (244 lb)   SpO2 100%   BMI 36.03 kg/m   General: 52 year old  African-American female well-developed well-nourished no acute distress.  Alert and oriented x3. Eyes: Sclera anicteric. ENT: Mucous membranes moist with no erythema or exudate. Neck: No JVD or thyromegaly. Cardiovascular: Regular rate and rhythm with no rubs or dolls. Respiratory: Clear to auscultation with no wheezes or rales. Abdomen: Tender on palpation of the right upper quadrant Skin: No noted ulcerative lesions or rashes. Musculoskeletal: Moves all 4 extremities.  No lower extremity edema. Psychiatric: Mood is appropriate for condition and setting. Neurologic: Alert and oriented x3.  No focal motor deficit.          Labs on Admission:  Basic Metabolic Panel: Recent Labs  Lab 07/17/17 1100 07/20/17 0129  NA 143 141  K 3.9 3.4*  CL 104 108  CO2 25 25  GLUCOSE 101* 128*  BUN 12 10  CREATININE 0.75 0.72  CALCIUM 9.5 9.3   Liver Function Tests: Recent Labs  Lab 07/17/17 1100 07/20/17 0129  AST 109* 236*  ALT 211* 171*  ALKPHOS 261* 303*  BILITOT 1.2 1.5*  PROT 7.1 7.7  ALBUMIN 4.2 3.6   Recent Labs  Lab 07/20/17 0129  LIPASE 37   No results for input(s): AMMONIA in the last 168 hours. CBC: Recent Labs  Lab 07/20/17 0129  WBC 4.5  HGB 11.0*  HCT 35.0*  MCV 84.1  PLT 196   Cardiac Enzymes: No results for  input(s): CKTOTAL, CKMB, CKMBINDEX, TROPONINI in the last 168 hours.  BNP (last 3 results) No results for input(s): BNP in the last 8760 hours.  ProBNP (last 3 results) No results for input(s): PROBNP in the last 8760 hours.  CBG: Recent Labs  Lab 07/20/17 1451  GLUCAP 84    Radiological Exams on Admission: Mr 3d Recon At Scanner  Result Date: 07/20/2017 CLINICAL DATA:  Severe abdominal pain. Elevated liver function tests. Cholelithiasis and dilated common bile duct on recent abdominal sonogram. EXAM: MRI ABDOMEN WITHOUT AND WITH CONTRAST (INCLUDING MRCP) TECHNIQUE: Multiplanar multisequence MR imaging of the abdomen was performed both before and after the administration of intravenous contrast. Heavily T2-weighted images of the biliary and pancreatic ducts were obtained, and three-dimensional MRCP images were rendered by post processing. CONTRAST:  87mL MULTIHANCE GADOBENATE DIMEGLUMINE 529 MG/ML IV SOLN COMPARISON:  07/17/2017 abdominal sonogram. 08/17/2015 CT abdomen/pelvis. FINDINGS: Lower chest: Mild hypoventilatory changes in the dependent lung bases. Hepatobiliary: Normal liver size and configuration. No hepatic steatosis. No liver mass. Mildly distended gallbladder. Prominent diffuse gallbladder wall thickening up to 19 mm thickness, new since 07/17/2017 abdominal sonogram. Diffuse gallbladder mucosal hyperenhancement. Multiple subcentimeter layering gallstones in the gallbladder. Diffuse porta hepatis and periportal edema, without intrahepatic biliary ductal dilatation. Common bile duct diameter 5 mm, normal. No evidence of choledocholithiasis. No biliary strictures or masses. Pancreas: No pancreatic mass or duct dilation.  No pancreas divisum. Spleen: Normal size. No mass. Adrenals/Urinary Tract: Normal adrenals. No hydronephrosis. Normal kidneys with no renal mass. Stomach/Bowel: Status post Nissen fundoplication. Small recurrent hiatal hernia. Otherwise normal nondistended stomach.  Visualized small and large bowel is normal caliber, with no bowel wall thickening. Vascular/Lymphatic: Normal caliber abdominal aorta. Patent portal, splenic, hepatic and renal veins. No pathologically enlarged lymph nodes in the abdomen. Other: No abdominal ascites or focal fluid collection. Musculoskeletal: No aggressive appearing focal osseous lesions. IMPRESSION: 1. Cholelithiasis. MRI findings are suggestive of acute cholecystitis. Prominent diffuse gallbladder wall thickening is new since the right upper quadrant abdominal sonogram study from 3 days prior. Diffuse gallbladder mucosal hyperenhancement. Porta hepatis and periportal edema. 2. No  biliary ductal dilatation. CBD diameter 5 mm. No evidence of choledocholithiasis. 3. Small recurrent hiatal hernia status post Nissen fundoplication. Electronically Signed   By: Ilona Sorrel M.D.   On: 07/20/2017 12:08   Mr Abdomen Mrcp Moise Boring Contast  Result Date: 07/20/2017 CLINICAL DATA:  Severe abdominal pain. Elevated liver function tests. Cholelithiasis and dilated common bile duct on recent abdominal sonogram. EXAM: MRI ABDOMEN WITHOUT AND WITH CONTRAST (INCLUDING MRCP) TECHNIQUE: Multiplanar multisequence MR imaging of the abdomen was performed both before and after the administration of intravenous contrast. Heavily T2-weighted images of the biliary and pancreatic ducts were obtained, and three-dimensional MRCP images were rendered by post processing. CONTRAST:  4mL MULTIHANCE GADOBENATE DIMEGLUMINE 529 MG/ML IV SOLN COMPARISON:  07/17/2017 abdominal sonogram. 08/17/2015 CT abdomen/pelvis. FINDINGS: Lower chest: Mild hypoventilatory changes in the dependent lung bases. Hepatobiliary: Normal liver size and configuration. No hepatic steatosis. No liver mass. Mildly distended gallbladder. Prominent diffuse gallbladder wall thickening up to 19 mm thickness, new since 07/17/2017 abdominal sonogram. Diffuse gallbladder mucosal hyperenhancement. Multiple  subcentimeter layering gallstones in the gallbladder. Diffuse porta hepatis and periportal edema, without intrahepatic biliary ductal dilatation. Common bile duct diameter 5 mm, normal. No evidence of choledocholithiasis. No biliary strictures or masses. Pancreas: No pancreatic mass or duct dilation.  No pancreas divisum. Spleen: Normal size. No mass. Adrenals/Urinary Tract: Normal adrenals. No hydronephrosis. Normal kidneys with no renal mass. Stomach/Bowel: Status post Nissen fundoplication. Small recurrent hiatal hernia. Otherwise normal nondistended stomach. Visualized small and large bowel is normal caliber, with no bowel wall thickening. Vascular/Lymphatic: Normal caliber abdominal aorta. Patent portal, splenic, hepatic and renal veins. No pathologically enlarged lymph nodes in the abdomen. Other: No abdominal ascites or focal fluid collection. Musculoskeletal: No aggressive appearing focal osseous lesions. IMPRESSION: 1. Cholelithiasis. MRI findings are suggestive of acute cholecystitis. Prominent diffuse gallbladder wall thickening is new since the right upper quadrant abdominal sonogram study from 3 days prior. Diffuse gallbladder mucosal hyperenhancement. Porta hepatis and periportal edema. 2. No biliary ductal dilatation. CBD diameter 5 mm. No evidence of choledocholithiasis. 3. Small recurrent hiatal hernia status post Nissen fundoplication. Electronically Signed   By: Ilona Sorrel M.D.   On: 07/20/2017 12:08    EKG: Independently reviewed. Personally reviewed reveals sinus rhythm with rate of 59.  Assessment/Plan Present on Admission: . Cholecystitis with cholelithiasis  Active Problems:   Cholecystitis with cholelithiasis   Acute cholecystitis with cholelithiasis General surgery consulted by ED physician Plan for lap cholecystectomy today Cardiology consulted for clearance.  Patient was already in the OR. Repeat CMP in the morning  Acute transaminitis most likely related to  cholecystitis Negative hepatitis viral panel done on 07/17/17 Repeat LFTs in the morning  Obesity Recommend outpatient weight loss  Type 2 diabetes Last A1c 6.8 on 05/14/2017 Start insulin sliding scale  History of Prinzmetal angina No chest pain Self-report last seen her cardiologist 2 years ago in [place] Cardiology consulted for surgical clearance-patient was already taken to the OR  Hypokalemia Potassium 3.4 Repleted with IV potassium chloride 10 mEq x4 Also given 2 g of IV magnesium Repeat chemistry panel in the morning, magnesium and phosphorus.  Chronic anemia We will get iron studies Baseline hemoglobin 11 No sign of overt bleeding Repeat CBC in the morning   DVT prophylaxis: SCDs subcu Lovenox  Code Status: Full code  Family Communication: None at bedside  Disposition Plan: Admit to [facility] called: General surgery consulted by ED physician Personally called cardiology for clearance but patient taken to  the OR prior to being seen by cardiology.  Admission status: Inpatient status    Kayleen Memos MD [facility] Pager [phone]  If 7PM-7AM, please contact night-coverage [email or web address] Password [facility]  07/20/2017, 4:13 PM

## 2017-07-20 NOTE — H&P (Addendum)
Reason for Consult:cholecystitis/cholelithiasis Referring Physician: Kirby Funk SW:FUXNATFT, Venia Minks, MD   Leslie Martinez is an 52 y.o. female.    HPI: Is a 52 year old female with multiple medical issues.  She has been complaining of right upper quadrant pain and family practice residents referred her to gastroenterology on 07/13/17.  Labs at that time showed an alk phos of 318, and AST  492,  ALT 437, total bilirubin of 3.2.  Repeat studies on 4/15 showed the alk phos down to 261, AST of 109, ALT of 211, and a bilirubin down to 1.2..  Dr. Silverio Decamp recommended going forward with a MRCP yesterday. She was doing well till last PM and sx returned, with pain and nausea, no vomiting or fever.  She did have a low grade fever 07/13/17.  Today she presented to the ED with 10/10 sharp abdominal pain and nausea. No vomiting this time like on 07/13/17. She was initially treated in the ED with 100 mg of fentanyl and 4 mg of Zofran.   Workup in the ED shows she is afebrile and hypertensive.  Labs shows a sodium of 141, potassium 3.4, glucose of 128, alkaline phosphatase of 303, lipase of 37, AST of 236, ALT of 171, total bilirubin of 1.5.  WBC 4.5, hemoglobin 11, hematocrit 35, platelets 296,000.  Pregnancy test is negative.  Urinalysis is unremarkable.  MRI was obtained in the ED.  This shows cholelithiasis, MRI findings suggestive of cholecystitis, prominent diffuse gallbladder wall thickening is new since right upper quadrant ultrasound from 3 days prior.  There is diffuse gallbladder mucosal hyperenhancement, porta hepatis and periportal edema.  There is no biliary ductal dilatation with common bile duct was 5 mm, there is no evidence of choledocholithiasis.  There is a small recurrent hiatal hernia status post Nissen fundoplication.  We are asked to see.           Past Medical History:  Diagnosis Date  . Anxiety    . Asthma      seasonal allergies, dry cough today   . 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 Cervical  cancer ([place])      "stage III; discovered S/P hysterectomy" (05/12/2017)  . Chronic bronchitis ([place])    . Depression    . Esophageal stricture    . Esophagitis    . Gastritis    . GERD (gastroesophageal reflux disease)    . Hiatal hernia      history of, treated with surgery  . History of blood transfusion 1990s    "related to childbirth"  . History of kidney stones      passed stones spontaneously, also cystoscopy- done   . Hypertension    . Migraine      "monthly" (05/12/2017)  . Osteoarthritis of both knees    . Pancreatitis    . Pneumonia 2000s X1  . Pre-diabetes    . 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 Seasonal allergies    . Sleep apnea      Had nasal sx, and uses no cpap           Past Surgical History:  Procedure Laterality Date  . APPENDECTOMY      . CARDIAC CATHETERIZATION   1990's X 1  . COLONOSCOPY      . CYSTOSCOPY W/ STONE MANIPULATION      . DILATION AND CURETTAGE OF UTERUS      . ENDOMETRIAL ABLATION      . ESOPHAGOGASTRODUODENOSCOPY (EGD) WITH ESOPHAGEAL  DILATION   "several times"  . HERNIA REPAIR      . JOINT REPLACEMENT      . KIDNEY POLYPS REMOVED      . KNEE ARTHROSCOPY Bilateral    . LAPAROSCOPIC NISSEN FUNDOPLICATION   73/07/1935    Procedure: LAPAROSCOPIC NISSEN FUNDOPLICATION;  Surgeon: Pedro Earls, MD;  Location: [facility];  Service: General;  Laterality: N/A;  . NASAL SEPTUM SURGERY   2000s    "w/adenoids and hyoid bone removed"  . REVISION TOTAL KNEE ARTHROPLASTY Left 05/12/2017  . TONSILLECTOMY   2004    T&A- due to sleep apnea   . TOTAL KNEE ARTHROPLASTY Left 07/13/2015    Procedure: TOTAL KNEE ARTHROPLASTY;  Surgeon: Frederik Pear, MD;  Location: [facility];  Service: Orthopedics;  Laterality: Left;  . TOTAL KNEE REVISION Left 05/12/2017    Procedure: TOTAL KNEE REVISION;  Surgeon: Frederik Pear, MD;  Location: [facility];  Service: Orthopedics;  Laterality: Left;  . TUBAL LIGATION      . UPPER GASTROINTESTINAL ENDOSCOPY      . UPPER GI  ENDOSCOPY   02/03/2012    Procedure: UPPER GI ENDOSCOPY;  Surgeon: Pedro Earls, MD;  Location: [facility];  Service: General;;  . VAGINAL HYSTERECTOMY      . WISDOM TOOTH EXTRACTION               Family History  Problem Relation Age of Onset  . Heart disease Mother    . Heart disease Father    . Diabetes Father    . Colon cancer Maternal Grandmother    . Colon polyps Neg Hx    . Esophageal cancer Neg Hx    . Rectal cancer Neg Hx    . Stomach cancer Neg Hx        Social History:  reports that she has never smoked. She has never used smokeless tobacco. She reports that she does not drink alcohol or use drugs.  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 Adhesive [Tape] Hives  . Iodine Hives and Rash  . Lisinopril Cough  . Cephalexin Hives and Rash  . Compazine [Prochlorperazine Edisylate]        Hyperactivity  . Latex Itching  . Oxycodone-Acetaminophen Itching and Rash           Prior to Admission medications   Medication Sig Start Date End Date Taking? Authorizing Provider  albuterol (PROVENTIL HFA;VENTOLIN HFA) 108 (90 BASE) MCG/ACT inhaler Inhale 2 puffs into the lungs every 6 (six) hours as needed for wheezing or shortness of breath.     Yes [provider]  cetirizine (ZYRTEC) 10 MG tablet Take 10 mg by mouth daily as needed for allergies.     Yes [provider]  lipase/protease/amylase (CREON) 36000 UNITS CPEP capsule Take 2 capsules with each meal and 1 capsule with a snack daily Patient taking differently: Take 36,000-72,000 Units by mouth See admin instructions. Take 2 capsules with each meal and 1 capsule with a snack daily 08/25/16   Yes Nandigam, Kavitha V, MD  Melatonin 5 MG TABS Take 5 mg by mouth at bedtime.     Yes [provider]  omeprazole (PRILOSEC) 40 MG  capsule Take 1 capsule (40 mg total) by mouth daily.  08/15/16   Yes Nandigam, Venia Minks, MD  oxyCODONE-acetaminophen (PERCOCET/ROXICET) 5-325 MG tablet Take 1 tablet by mouth every 4 (four) hours as needed (for pain.). 05/12/17   Yes Joanell Rising K, PA-C  sertraline (ZOLOFT) 100 MG tablet Take 1 tablet (100 mg total) by mouth daily. 06/30/17   Yes Carlyle Dolly, MD  traMADol (ULTRAM) 50 MG tablet Take by mouth every 6 (six) hours as needed for moderate pain.      Yes [provider]  aspirin EC 325 MG tablet Take 1 tablet (325 mg total) by mouth 2 (two) times daily. Patient not taking: Reported on 07/20/2017 05/12/17     Leighton Parody, PA-C  ciprofloxacin (CIPRO) 500 MG tablet Take 1 tablet (500 mg total) by mouth 2 (two) times daily for 7 days. Patient not taking: Reported on 07/20/2017 07/14/17 07/21/17   Smiley Houseman, MD  metFORMIN (GLUCOPHAGE) 500 MG tablet Take 1 tablet (500 mg total) by mouth daily with breakfast. Patient not taking: Reported on 07/20/2017 06/30/17  Pt reports she is not taking this, it upsets her stomach   Carlyle Dolly, MD  metroNIDAZOLE (FLAGYL) 500 MG tablet Take 1 tablet (500 mg total) by mouth 3 (three) times daily for 7 days. Patient not taking: Reported on 07/20/2017 07/14/17 07/21/17   Smiley Houseman, MD            Lab Results Last 48 Hours        Results for orders placed or performed during the hospital encounter of 07/19/17 (from the past 48 hour(s))  Lipase, blood     Status: None    Collection Time: 07/20/17  1:29 AM  Result Value Ref Range    Lipase 37 11 - 51 U/L      Comment: Performed at [facility], [place] [address] [place], [place] [address]  Comprehensive metabolic panel     Status: Abnormal    Collection Time: 07/20/17  1:29 AM  Result Value Ref Range    Sodium 141 135 - 145 mmol/L    Potassium 3.4 (L) 3.5 - 5.1 mmol/L    Chloride 108 101 - 111 mmol/L    CO2 25 22 - 32 mmol/L    Glucose, Bld 128  (H) 65 - 99 mg/dL    BUN 10 6 - 20 mg/dL    Creatinine, Ser 0.72 0.44 - 1.00 mg/dL    Calcium 9.3 8.9 - 10.3 mg/dL    Total Protein 7.7 6.5 - 8.1 g/dL    Albumin 3.6 3.5 - 5.0 g/dL    AST 236 (H) 15 - 41 U/L    ALT 171 (H) 14 - 54 U/L    Alkaline Phosphatase 303 (H) 38 - 126 U/L    Total Bilirubin 1.5 (H) 0.3 - 1.2 mg/dL    GFR calc non Af Amer >60 >60 mL/min    GFR calc Af Amer >60 >60 mL/min      Comment: (NOTE) The eGFR has been calculated using the CKD EPI equation. This calculation has not been validated in all clinical situations. eGFR's persistently <60 mL/min signify possible Chronic Kidney Disease.      Anion gap 8 5 - 15      Comment: Performed at [facility], [place] [address], [place], [place] [address]  CBC     Status: Abnormal    Collection Time: 07/20/17  1:29 AM  Result Value Ref Range    WBC 4.5 4.0 - 10.5  K/uL    RBC 4.16 3.87 - 5.11 MIL/uL    Hemoglobin 11.0 (L) 12.0 - 15.0 g/dL    HCT 35.0 (L) 36.0 - 46.0 %    MCV 84.1 78.0 - 100.0 fL    MCH 26.4 26.0 - 34.0 pg    MCHC 31.4 30.0 - 36.0 g/dL    RDW 13.6 11.5 - 15.5 %    Platelets 196 150 - 400 K/uL      Comment: Performed at [facility], [place] [address], [place], [place] [address]  I-Stat beta hCG blood, ED     Status: None    Collection Time: 07/20/17  1:36 AM  Result Value Ref Range    I-stat hCG, quantitative <5.0 <5 mIU/mL    Comment 3               Comment:   GEST. AGE      CONC.  (mIU/mL)   <=1 WEEK        5 - 50     2 WEEKS       50 - 500     3 WEEKS       100 - 10,000     4 WEEKS     1,000 - 30,000        FEMALE AND NON-PREGNANT FEMALE:     LESS THAN 5 mIU/mL    Urinalysis, Routine w reflex microscopic     Status: Abnormal    Collection Time: 07/20/17  4:56 AM  Result Value Ref Range    Color, Urine YELLOW YELLOW    APPearance CLEAR CLEAR    Specific Gravity, Urine 1.018 1.005 - 1.030    pH 6.0 5.0 - 8.0    Glucose, UA NEGATIVE NEGATIVE mg/dL    Hgb  urine dipstick SMALL (A) NEGATIVE    Bilirubin Urine NEGATIVE NEGATIVE    Ketones, ur NEGATIVE NEGATIVE mg/dL    Protein, ur NEGATIVE NEGATIVE mg/dL    Nitrite NEGATIVE NEGATIVE    Leukocytes, UA NEGATIVE NEGATIVE    RBC / HPF 6-30 0 - 5 RBC/hpf    WBC, UA 0-5 0 - 5 WBC/hpf    Bacteria, UA NONE SEEN NONE SEEN    Squamous Epithelial / LPF 0-5 (A) NONE SEEN    Mucus PRESENT        Comment: Performed at [facility], [place] [address], [place], [place] [address]  POC urine preg, ED     Status: None    Collection Time: 07/20/17  4:57 AM  Result Value Ref Range    Preg Test, Ur NEGATIVE NEGATIVE      Comment:        THE SENSITIVITY OF THIS METHODOLOGY IS >24 mIU/mL           Imaging Results (Last 48 hours)  Mr 3d Recon At Scanner   Result Date: 07/20/2017 CLINICAL DATA:  Severe abdominal pain. Elevated liver function tests. Cholelithiasis and dilated common bile duct on recent abdominal sonogram. EXAM: MRI ABDOMEN WITHOUT AND WITH CONTRAST (INCLUDING MRCP) TECHNIQUE: Multiplanar multisequence MR imaging of the abdomen was performed both before and after the administration of intravenous contrast. Heavily T2-weighted images of the biliary and pancreatic ducts were obtained, and three-dimensional MRCP images were rendered by post processing. CONTRAST:  73m MULTIHANCE GADOBENATE DIMEGLUMINE 529 MG/ML IV SOLN COMPARISON:  07/17/2017 abdominal sonogram. 08/17/2015 CT abdomen/pelvis. FINDINGS: Lower chest: Mild hypoventilatory changes in the dependent lung bases. Hepatobiliary: Normal liver size and configuration.  No hepatic steatosis. No liver mass. Mildly distended gallbladder. Prominent diffuse gallbladder wall thickening up to 19 mm thickness, new since 07/17/2017 abdominal sonogram. Diffuse gallbladder mucosal hyperenhancement. Multiple subcentimeter layering gallstones in the gallbladder. Diffuse porta hepatis and periportal edema, without intrahepatic biliary ductal  dilatation. Common bile duct diameter 5 mm, normal. No evidence of choledocholithiasis. No biliary strictures or masses. Pancreas: No pancreatic mass or duct dilation.  No pancreas divisum. Spleen: Normal size. No mass. Adrenals/Urinary Tract: Normal adrenals. No hydronephrosis. Normal kidneys with no renal mass. Stomach/Bowel: Status post Nissen fundoplication. Small recurrent hiatal hernia. Otherwise normal nondistended stomach. Visualized small and large bowel is normal caliber, with no bowel wall thickening. Vascular/Lymphatic: Normal caliber abdominal aorta. Patent portal, splenic, hepatic and renal veins. No pathologically enlarged lymph nodes in the abdomen. Other: No abdominal ascites or focal fluid collection. Musculoskeletal: No aggressive appearing focal osseous lesions. IMPRESSION: 1. Cholelithiasis. MRI findings are suggestive of acute cholecystitis. Prominent diffuse gallbladder wall thickening is new since the right upper quadrant abdominal sonogram study from 3 days prior. Diffuse gallbladder mucosal hyperenhancement. Porta hepatis and periportal edema. 2. No biliary ductal dilatation. CBD diameter 5 mm. No evidence of choledocholithiasis. 3. Small recurrent hiatal hernia status post Nissen fundoplication. Electronically Signed   By: Ilona Sorrel M.D.   On: 07/20/2017 12:08    Mr Abdomen Mrcp Moise Boring Contast   Result Date: 07/20/2017 CLINICAL DATA:  Severe abdominal pain. Elevated liver function tests. Cholelithiasis and dilated common bile duct on recent abdominal sonogram. EXAM: MRI ABDOMEN WITHOUT AND WITH CONTRAST (INCLUDING MRCP) TECHNIQUE: Multiplanar multisequence MR imaging of the abdomen was performed both before and after the administration of intravenous contrast. Heavily T2-weighted images of the biliary and pancreatic ducts were obtained, and three-dimensional MRCP images were rendered by post processing. CONTRAST:  49m MULTIHANCE GADOBENATE DIMEGLUMINE 529 MG/ML IV SOLN COMPARISON:   07/17/2017 abdominal sonogram. 08/17/2015 CT abdomen/pelvis. FINDINGS: Lower chest: Mild hypoventilatory changes in the dependent lung bases. Hepatobiliary: Normal liver size and configuration. No hepatic steatosis. No liver mass. Mildly distended gallbladder. Prominent diffuse gallbladder wall thickening up to 19 mm thickness, new since 07/17/2017 abdominal sonogram. Diffuse gallbladder mucosal hyperenhancement. Multiple subcentimeter layering gallstones in the gallbladder. Diffuse porta hepatis and periportal edema, without intrahepatic biliary ductal dilatation. Common bile duct diameter 5 mm, normal. No evidence of choledocholithiasis. No biliary strictures or masses. Pancreas: No pancreatic mass or duct dilation.  No pancreas divisum. Spleen: Normal size. No mass. Adrenals/Urinary Tract: Normal adrenals. No hydronephrosis. Normal kidneys with no renal mass. Stomach/Bowel: Status post Nissen fundoplication. Small recurrent hiatal hernia. Otherwise normal nondistended stomach. Visualized small and large bowel is normal caliber, with no bowel wall thickening. Vascular/Lymphatic: Normal caliber abdominal aorta. Patent portal, splenic, hepatic and renal veins. No pathologically enlarged lymph nodes in the abdomen. Other: No abdominal ascites or focal fluid collection. Musculoskeletal: No aggressive appearing focal osseous lesions. IMPRESSION: 1. Cholelithiasis. MRI findings are suggestive of acute cholecystitis. Prominent diffuse gallbladder wall thickening is new since the right upper quadrant abdominal sonogram study from 3 days prior. Diffuse gallbladder mucosal hyperenhancement. Porta hepatis and periportal edema. 2. No biliary ductal dilatation. CBD diameter 5 mm. No evidence of choledocholithiasis. 3. Small recurrent hiatal hernia status post Nissen fundoplication. Electronically Signed   By: JIlona SorrelM.D.   On: 07/20/2017 12:08       Review of Systems  Constitutional: Positive for fever (4/11, none  today). Negative for chills, diaphoresis, malaise/fatigue and weight loss.  HENT: Negative.  Eyes: Negative.   Respiratory: Negative.   Cardiovascular: Negative.   Gastrointestinal: Positive for abdominal pain, heartburn, nausea and vomiting (4/11, none today or yesterday). Negative for blood in stool, constipation, diarrhea and melena.       She has chronic pancreatitis with Creon supplement and yellow fatty stools  Genitourinary: Negative.   Musculoskeletal: Positive for joint pain (redo left knee 05/2017).  Skin: Negative.   Neurological: Negative.   Endo/Heme/Allergies: Negative.   Psychiatric/Behavioral: Positive for depression.    Blood pressure (!) 163/91, pulse 64, temperature 98.3 F (36.8 C), temperature source Oral, resp. rate 12, height '5\' 9"'$  (1.753 m), weight 110.7 kg (244 lb), SpO2 100 %. Physical Exam  Constitutional: She is oriented to person, place, and time. She appears well-developed and well-nourished. No distress.  HENT:  Head: Normocephalic and atraumatic.  Mouth/Throat: Oropharynx is clear and moist. No oropharyngeal exudate.  Eyes: Right eye exhibits no discharge. Left eye exhibits no discharge. No scleral icterus.  Pupils are equal  Neck: Normal range of motion. Neck supple. No JVD present. No tracheal deviation present. No thyromegaly present.  Cardiovascular: Normal rate, regular rhythm, normal heart sounds and intact distal pulses.  No murmur heard. Respiratory: Effort normal and breath sounds normal. No respiratory distress. She has no wheezes. She has no rales. She exhibits no tenderness.  GI: Soft. Bowel sounds are normal. She exhibits no distension and no mass. There is tenderness (RUQ). There is no rebound and no guarding.  Lymphadenopathy:    She has no cervical adenopathy.  Neurological: She is alert and oriented to person, place, and time. No cranial nerve deficit.  Skin: Skin is warm and dry. No rash noted. She is not diaphoretic. No erythema. No  pallor.  Psychiatric: She has a normal mood and affect. Her behavior is normal. Judgment and thought content normal.      Assessment/Plan: Cholelithiasis/cholecystitis Chronic pancreatitis Essential hypertension Type 2 diabetes without complication last V4B was 6.7.  Started on Metformin 06/30/17. Prior appendectomy, endometrial ablation, laparoscopic Niesen fundoplication 44/96/7591 Osteoarthritis/total left knee revision 05/2017 History of Prinzmetal angina - none for years Sleep apnea - had surgery with no improvement obesity     Plan:  Admit and place on IV fluids/antibiotics, laparoscopic cholecystectomy     Kyrian Stage 07/20/2017, 12:49 PM

## 2017-07-20 NOTE — ED Notes (Signed)
EKG given to EDP,Molpus,MD., for review. 

## 2017-07-20 NOTE — Anesthesia Preprocedure Evaluation (Addendum)
Anesthesia Evaluation  Patient identified by MRN, date of birth, ID band Patient awake    Reviewed: Allergy & Precautions, NPO status , Patient's Chart, lab work & pertinent test results  Airway Mallampati: II  TM Distance: >3 FB     Dental   Pulmonary asthma , sleep apnea , pneumonia,    breath sounds clear to auscultation       Cardiovascular hypertension, + angina + CAD   Rhythm:Regular Rate:Normal     Neuro/Psych    GI/Hepatic Neg liver ROS, hiatal hernia, GERD  ,  Endo/Other  diabetes  Renal/GU negative Renal ROS     Musculoskeletal   Abdominal   Peds  Hematology   Anesthesia Other Findings   Reproductive/Obstetrics                             Anesthesia Physical Anesthesia Plan  ASA: III  Anesthesia Plan: General   Post-op Pain Management:    Induction: Intravenous  PONV Risk Score and Plan: Treatment may vary due to age or medical condition, Dexamethasone, Ondansetron and Midazolam  Airway Management Planned: Oral ETT  Additional Equipment:   Intra-op Plan:   Post-operative Plan: Extubation in OR  Informed Consent: I have reviewed the patients History and Physical, chart, labs and discussed the procedure including the risks, benefits and alternatives for the proposed anesthesia with the patient or authorized representative who has indicated his/her understanding and acceptance.   Dental advisory given  Plan Discussed with: CRNA and Anesthesiologist  Anesthesia Plan Comments:         Anesthesia Quick Evaluation

## 2017-07-20 NOTE — Discharge Instructions (Signed)
[facility] ______[facility] °LAPAROSCOPIC SURGERY: POST OP INSTRUCTIONS °Always review your discharge instruction sheet given to you by the facility where your surgery was performed. °IF YOU HAVE DISABILITY OR FAMILY LEAVE FORMS, YOU MUST BRING THEM TO THE OFFICE FOR PROCESSING.   °DO NOT GIVE THEM TO YOUR DOCTOR. ° °1. A prescription for pain medication may be given to you upon discharge.  Take your pain medication as prescribed, if needed.  If narcotic pain medicine is not needed, then you may take acetaminophen (Tylenol) or ibuprofen (Advil) as needed. °2. Take your usually prescribed medications unless otherwise directed. °3. If you need a refill on your pain medication, please contact your pharmacy.  They will contact our office to request authorization. Prescriptions will not be filled after 5pm or on week-ends. °4. You should follow a light diet the first few days after arrival home, such as soup and crackers, etc.  Be sure to include lots of fluids daily. °5. Most patients will experience some swelling and bruising in the area of the incisions.  Ice packs will help.  Swelling and bruising can take several days to resolve.  °6. It is common to experience some constipation if taking pain medication after surgery.  Increasing fluid intake and taking a stool softener (such as Colace) will usually help or prevent this problem from occurring.  A mild laxative (Milk of Magnesia or Miralax) should be taken according to package instructions if there are no bowel movements after 48 hours. °7. Unless discharge instructions indicate otherwise, you may remove your bandages 24-48 hours after surgery, and you may shower at that time.  You may have steri-strips (small skin tapes) in place directly over the incision.  These strips should be left on the skin for 7-10 days.  If your surgeon used skin glue on the incision, you may shower in 24 hours.  The glue will flake off over the next 2-3 weeks.  Any sutures or  staples will be removed at the office during your follow-up visit. °8. ACTIVITIES:  You may resume regular (light) daily activities beginning the next day--such as daily self-care, walking, climbing stairs--gradually increasing activities as tolerated.  You may have sexual intercourse when it is comfortable.  Refrain from any heavy lifting or straining until approved by your doctor. °a. You may drive when you are no longer taking prescription pain medication, you can comfortably wear a seatbelt, and you can safely maneuver your car and apply brakes. °b. RETURN TO WORK:  __________________________________________________________ °9. You should see your doctor in the office for a follow-up appointment approximately 2-3 weeks after your surgery.  Make sure that you call for this appointment within a day or two after you arrive home to insure a convenient appointment time. °10. OTHER INSTRUCTIONS: __________________________________________________________________________________________________________________________ __________________________________________________________________________________________________________________________ °WHEN TO CALL YOUR DOCTOR: °1. Fever over 101.0 °2. Inability to urinate °3. Continued bleeding from incision. °4. Increased pain, redness, or drainage from the incision. °5. Increasing abdominal pain ° °The clinic staff is available to answer your questions during regular business hours.  Please don’t hesitate to call and ask to speak to one of the nurses for clinical concerns.  If you have a medical emergency, go to the nearest emergency room or call 911.  A surgeon from [facility] is always on call at the hospital. °[address], [address], [place], [place]  [address] ? [address] [address], [place],    [address] °[phone] ? [phone] ? FAX [phone] °Web site:   [email or web address] ° ° °Laparoscopic Cholecystectomy, Care After °This sheet  gives you information about how to care for yourself after your procedure. Your health care provider may also give you more specific instructions. If you have problems or questions, contact your health care provider. °What can I expect after the procedure? °After the procedure, it is common to have: °· Pain at your incision sites. You will be given medicines to control this pain. °· Mild nausea or vomiting. °· Bloating and possible shoulder pain from the air-like gas that was used during the procedure. ° °Follow these instructions at home: °Incision care ° °· Follow instructions from your health care provider about how to take care of your incisions. Make sure you: °? Wash your hands with soap and water before you change your bandage (dressing). If soap and water are not available, use hand sanitizer. °? Change your dressing as told by your health care provider. °? Leave stitches (sutures), skin glue, or adhesive strips in place. These skin closures may need to be in place for 2 weeks or longer. If adhesive strip edges start to loosen and curl up, you may trim the loose edges. Do not remove adhesive strips completely unless your health care provider tells you to do that. °· Do not take baths, swim, or use a hot tub until your health care provider approves. Ask your health care provider if you can take showers. You may only be allowed to take sponge baths for bathing. °· Check your incision area every day for signs of infection. Check for: °? More redness, swelling, or pain. °? More fluid or blood. °? Warmth. °? Pus or a bad smell. °Activity °· Do not drive or use heavy machinery while taking prescription pain medicine. °· Do not lift anything that is heavier than 10 lb (4.5 kg) until your health care provider approves. °· Do not play contact sports until your health care provider approves. °· Do not drive for 24 hours if you were given a medicine to help you relax (sedative). °· Rest as needed. Do not return to work  or school until your health care provider approves. °General instructions °· Take over-the-counter and prescription medicines only as told by your health care provider. °· To prevent or treat constipation while you are taking prescription pain medicine, your health care provider may recommend that you: °? Drink enough fluid to keep your urine clear or pale yellow. °? Take over-the-counter or prescription medicines. °? Eat foods that are high in fiber, such as fresh fruits and vegetables, whole grains, and beans. °? Limit foods that are high in fat and processed sugars, such as fried and sweet foods. °Contact a health care provider if: °· You develop a rash. °· You have more redness, swelling, or pain around your incisions. °· You have more fluid or blood coming from your incisions. °· Your incisions feel warm to the touch. °· You have pus or a bad smell coming from your incisions. °· You have a fever. °· One or more of your incisions breaks open. °Get help right away if: °· You have trouble breathing. °· You have chest pain. °· You have increasing pain in your shoulders. °· You faint or feel dizzy when you stand. °· You have severe pain in your abdomen. °· You have nausea or vomiting that lasts for more than one day. °· You have leg pain. °This information is not intended to replace advice given to you by your health care provider. Make sure you   discuss any questions you have with your health care provider. °Document Released: 03/21/2005 Document Revised: 10/10/2015 Document Reviewed: 09/07/2015 °Elsevier Interactive Patient Education © 2018 [employer] ° °

## 2017-07-20 NOTE — ED Triage Notes (Signed)
Patient complains of 10/10 sharp abdominal pain. She reports she had an ultrasound taken recently showing gallstones in her bile duct. She also complains of nausea with no vomiting.

## 2017-07-20 NOTE — ED Notes (Signed)
ADMITTING MD PRESENT 

## 2017-07-20 NOTE — ED Triage Notes (Signed)
During transport she was given 100 of fentanyl and 4mg  of zofran.

## 2017-07-20 NOTE — ED Notes (Signed)
HOXWORTH MD AT BEDSIDE

## 2017-07-20 NOTE — Anesthesia Procedure Notes (Signed)
Date/Time: 07/20/2017 5:14 PM Performed by: Cynda Familia, CRNA Pre-anesthesia Checklist: Suction available and Emergency Drugs available Oxygen Delivery Method: Simple face mask Placement Confirmation: positive ETCO2 and breath sounds checked- equal and bilateral Dental Injury: Teeth and Oropharynx as per pre-operative assessment

## 2017-07-20 NOTE — ED Notes (Signed)
CBG 84. 

## 2017-07-20 NOTE — ED Notes (Signed)
SURGICAL PA Provider at bedside.

## 2017-07-20 NOTE — ED Provider Notes (Signed)
[facility] DEPT Provider Note   CSN: [id number] Arrival date & time: 07/19/17  2333     History   Chief Complaint Chief Complaint  Patient presents with  . Abdominal Pain    HPI Leslie Martinez is a 52 y.o. female who presents the emergency department with chief complaint of right upper quadrant abdominal pain.  Patient states that she had onset of symptoms last week she woke up with severe right upper quadrant pain.  She was evaluated and had an ultrasound that showed a dilated bile duct.  She saw her gastroenterologist who has her scheduled for an MRCP on the 19th.  She had associated clay colored stools and dark urine over the past week.  She had no symptoms this weekend and normalized urine and stool.  Last night the patient developed severe right upper quadrant pain again radiating to her right scapula and right mid back.  She had associated severe nausea without vomiting.  Review of labs shows elevated LFTs.  Patient has been here for about 7 hours and his pain controlled on as needed fentanyl at this time.  HPI  Past Medical History:  Diagnosis Date  . Anxiety   . Asthma    seasonal allergies, dry cough today   . 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 Cervical cancer ([place])    "stage III; discovered S/P hysterectomy" (05/12/2017)  . Chronic bronchitis ([place])   . Depression   . Esophageal stricture   . Esophagitis   . Gastritis   . GERD (gastroesophageal reflux disease)   . Hiatal hernia    history of, treated with surgery  . History of blood transfusion 1990s   "related to childbirth"  . History of kidney stones    passed stones spontaneously, also cystoscopy- done   . Hypertension   . Migraine    "monthly" (05/12/2017)  . Osteoarthritis of both knees   . Pancreatitis   . Pneumonia 2000s X1  . Pre-diabetes   . 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 Seasonal allergies   . Sleep apnea    Had nasal sx, and uses no cpap    Patient Active  Problem List   Diagnosis Date Noted  . Type 2 diabetes mellitus without complication ([place]) 86/57/8469  . 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 Depression 07/23/2015  . Generalized anxiety disorder 07/23/2015  . Primary osteoarthritis of knee 07/13/2015  . Primary osteoarthritis of left knee 07/11/2015  . 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 Lap Nissen Nov 2013 02/06/2012  . Essential hypertension 01/09/2008  . 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 Asthma, chronic 01/09/2008  . BARRETT'S ESOPHAGUS, HX OF 01/09/2008  . NEPHROLITHIASIS, HX OF 01/09/2008  . DYSPHAGIA UNSPECIFIED 11/26/2007    Past Surgical History:  Procedure Laterality Date  . APPENDECTOMY    . CARDIAC CATHETERIZATION  1990's X 1  . COLONOSCOPY    . CYSTOSCOPY W/ STONE MANIPULATION    . DILATION AND CURETTAGE OF UTERUS    . ENDOMETRIAL ABLATION    . ESOPHAGOGASTRODUODENOSCOPY (EGD) WITH ESOPHAGEAL DILATION  "several times"  . HERNIA REPAIR    . JOINT REPLACEMENT    . KIDNEY POLYPS REMOVED    . KNEE ARTHROSCOPY Bilateral   . LAPAROSCOPIC NISSEN FUNDOPLICATION  62/12/5282   Procedure: LAPAROSCOPIC NISSEN FUNDOPLICATION;  Surgeon: Pedro Earls, MD;  Location: [facility];  Service: General;  Laterality: N/A;  . NASAL SEPTUM SURGERY  2000s   "w/adenoids and hyoid bone removed"  .  REVISION TOTAL KNEE ARTHROPLASTY Left 05/12/2017  . TONSILLECTOMY  2004   T&A- due to sleep apnea   . TOTAL KNEE ARTHROPLASTY Left 07/13/2015   Procedure: TOTAL KNEE ARTHROPLASTY;  Surgeon: Frederik Pear, MD;  Location: [facility];  Service: Orthopedics;  Laterality: Left;  . TOTAL KNEE REVISION Left 05/12/2017   Procedure: TOTAL KNEE REVISION;  Surgeon: Frederik Pear, MD;  Location: [facility];  Service: Orthopedics;  Laterality: Left;  . TUBAL LIGATION    . UPPER GASTROINTESTINAL ENDOSCOPY    . UPPER GI ENDOSCOPY  02/03/2012   Procedure: UPPER GI ENDOSCOPY;  Surgeon: Pedro Earls, MD;  Location: [facility];  Service: General;;  . VAGINAL  HYSTERECTOMY    . WISDOM TOOTH EXTRACTION       OB History   None      Home Medications    Prior to Admission medications   Medication Sig Start Date End Date Taking? Authorizing Provider  albuterol (PROVENTIL HFA;VENTOLIN HFA) 108 (90 BASE) MCG/ACT inhaler Inhale 2 puffs into the lungs every 6 (six) hours as needed for wheezing or shortness of breath.   Yes [provider]  cetirizine (ZYRTEC) 10 MG tablet Take 10 mg by mouth daily as needed for allergies.   Yes [provider]  lipase/protease/amylase (CREON) 36000 UNITS CPEP capsule Take 2 capsules with each meal and 1 capsule with a snack daily Patient taking differently: Take 36,000-72,000 Units by mouth See admin instructions. Take 2 capsules with each meal and 1 capsule with a snack daily 08/25/16  Yes Nandigam, Kavitha V, MD  Melatonin 5 MG TABS Take 5 mg by mouth at bedtime.   Yes [provider]  omeprazole (PRILOSEC) 40 MG capsule Take 1 capsule (40 mg total) by mouth daily. 08/15/16  Yes Nandigam, Venia Minks, MD  oxyCODONE-acetaminophen (PERCOCET/ROXICET) 5-325 MG tablet Take 1 tablet by mouth every 4 (four) hours as needed (for pain.). 05/12/17  Yes Joanell Rising K, PA-C  sertraline (ZOLOFT) 100 MG tablet Take 1 tablet (100 mg total) by mouth daily. 06/30/17  Yes Carlyle Dolly, MD  traMADol (ULTRAM) 50 MG tablet Take by mouth every 6 (six) hours as needed for moderate pain.    Yes [provider]  aspirin EC 325 MG tablet Take 1 tablet (325 mg total) by mouth 2 (two) times daily. Patient not taking: Reported on 07/20/2017 05/12/17   Leighton Parody, PA-C  ciprofloxacin (CIPRO) 500 MG tablet Take 1 tablet (500 mg total) by mouth 2 (two) times daily for 7 days. Patient not taking: Reported on 07/20/2017 07/14/17 07/21/17  Smiley Houseman, MD  metFORMIN (GLUCOPHAGE) 500 MG tablet Take 1 tablet (500 mg total) by mouth daily with breakfast. Patient not taking: Reported on 07/20/2017 06/30/17    Carlyle Dolly, MD  metroNIDAZOLE (FLAGYL) 500 MG tablet Take 1 tablet (500 mg total) by mouth 3 (three) times daily for 7 days. Patient not taking: Reported on 07/20/2017 07/14/17 07/21/17  Smiley Houseman, MD    Family History Family History  Problem Relation Age of Onset  . Heart disease Mother   . Heart disease Father   . Diabetes Father   . Colon cancer Maternal Grandmother   . Colon polyps Neg Hx   . Esophageal cancer Neg Hx   . Rectal cancer Neg Hx   . Stomach cancer Neg Hx     Social History Social History   Tobacco Use  . Smoking status: Never Smoker  . Smokeless tobacco: Never Used  Substance Use Topics  . Alcohol use: No    Frequency: Never  . Drug use: No     Allergies   Adhesive [tape]; Iodine; Lisinopril; Cephalexin; Compazine [prochlorperazine edisylate]; Latex; and Oxycodone-acetaminophen   Review of Systems Review of Systems  Ten systems reviewed and are negative for acute change, except as noted in the HPI.   Physical Exam Updated Vital Signs BP (!) 172/93 (BP Location: Right Arm)   Pulse 66   Temp 98.4 F (36.9 C) (Oral)   Resp 16   Ht 5\' 9"  (1.753 m)   Wt 110.7 kg (244 lb)   SpO2 100%   BMI 36.03 kg/m   Physical Exam  Constitutional: She is oriented to person, place, and time. She appears well-developed and well-nourished. No distress.  HENT:  Head: Normocephalic and atraumatic.  Eyes: Conjunctivae are normal. No scleral icterus.  Neck: Normal range of motion.  Cardiovascular: Normal rate, regular rhythm and normal heart sounds. Exam reveals no gallop and no friction rub.  No murmur heard. Pulmonary/Chest: Effort normal and breath sounds normal. No respiratory distress.  Abdominal: Soft. Bowel sounds are normal. She exhibits no distension and no mass. There is tenderness in the right upper quadrant. There is positive Murphy's sign. There is no guarding.  Positive Murphy sign on evaluation, general tenderness in the right upper  quadrant  Neurological: She is alert and oriented to person, place, and time.  Skin: Skin is warm and dry. She is not diaphoretic.  Psychiatric: Her behavior is normal.  Nursing note and vitals reviewed.    ED Treatments / Results  Labs (all labs ordered are listed, but only abnormal results are displayed) Labs Reviewed  COMPREHENSIVE METABOLIC PANEL - Abnormal; Notable for the following components:      Result Value   Potassium 3.4 (*)    Glucose, Bld 128 (*)    AST 236 (*)    ALT 171 (*)    Alkaline Phosphatase 303 (*)    Total Bilirubin 1.5 (*)    All other components within normal limits  CBC - Abnormal; Notable for the following components:   Hemoglobin 11.0 (*)    HCT 35.0 (*)    All other components within normal limits  URINALYSIS, ROUTINE W REFLEX MICROSCOPIC - Abnormal; Notable for the following components:   Hgb urine dipstick SMALL (*)    Squamous Epithelial / LPF 0-5 (*)    All other components within normal limits  LIPASE, BLOOD  I-STAT BETA HCG BLOOD, ED ([facility], [facility], [facility] ONLY)  POC URINE PREG, ED    EKG EKG Interpretation  Date/Time:  Thursday July 20 2017 01:22:27 EDT Ventricular Rate:  59 PR Interval:    QRS Duration: 100 QT Interval:  434 QTC Calculation: 430 R Axis:   83 Text Interpretation:  Sinus rhythm Borderline T abnormalities, anterior leads No significant change was found Confirmed by Shanon Rosser [phone]) on 07/20/2017 1:28:36 AM Also confirmed by Shanon Rosser [phone]), editor Oswaldo Milian, [place] ([id number])  on 07/20/2017 6:59:18 AM   Radiology No results found.  Procedures Procedures (including critical care time)  Medications Ordered in ED Medications  ondansetron (ZOFRAN) injection 4 mg (has no administration in time range)  fentaNYL (SUBLIMAZE) injection 50 mcg (50 mcg Intravenous Given 07/20/17 0414)     Initial Impression / Assessment and Plan / ED Course  I have reviewed the triage vital signs and the nursing notes.  Pertinent labs  & imaging results that were available during my care of the  patient were reviewed by me and considered in my medical decision making (see chart for details).  Clinical Course as of Jul 20 1408  Thu Jul 20, 2017  0743 Patient with elevated liver enzymes and bilirubin.  She has no scleral icterus or jaundice.  She is not actively vomiting and her nausea is controlled.  We will be obtaining an MRCP this morning to evaluate whether the patient needs ERCP through GI versus potential surgical consult.  Either way patient is too symptomatic to be discharged I feel at this time.   [AH]  1410 Patient MRI shows acute cholecystitis, no evidence of retained stone.  I spoken to Modena Jansky he will see the patient for the surgical service.  Patient will be admitted by the hospitalist service.  She is stable throughout her ER visit.   [AH]    Clinical Course User Index [AH] Margarita Mail, PA-C      Final Clinical Impressions(s) / ED Diagnoses   Final diagnoses:  None    ED Discharge Orders    None       Margarita Mail, PA-C 07/20/17 1411    Carmin Muskrat, MD 07/20/17 1450

## 2017-07-21 ENCOUNTER — Encounter (HOSPITAL_COMMUNITY): Payer: Self-pay | Admitting: General Surgery

## 2017-07-21 LAB — COMPREHENSIVE METABOLIC PANEL WITH GFR
ALT: 265 U/L — ABNORMAL HIGH (ref 14–54)
AST: 239 U/L — ABNORMAL HIGH (ref 15–41)
Albumin: 3.7 g/dL (ref 3.5–5.0)
Alkaline Phosphatase: 342 U/L — ABNORMAL HIGH (ref 38–126)
Anion gap: 10 (ref 5–15)
BUN: 5 mg/dL — ABNORMAL LOW (ref 6–20)
CO2: 24 mmol/L (ref 22–32)
Calcium: 9.7 mg/dL (ref 8.9–10.3)
Chloride: 108 mmol/L (ref 101–111)
Creatinine, Ser: 0.75 mg/dL (ref 0.44–1.00)
GFR calc Af Amer: >60 mL/min
GFR calc non Af Amer: >60 mL/min
Glucose, Bld: 157 mg/dL — ABNORMAL HIGH (ref 65–99)
Potassium: 3.9 mmol/L (ref 3.5–5.1)
Sodium: 142 mmol/L (ref 135–145)
Total Bilirubin: 0.9 mg/dL (ref 0.3–1.2)
Total Protein: 7.5 g/dL (ref 6.5–8.1)

## 2017-07-21 LAB — IRON AND TIBC
IRON: 86 ug/dL (ref 28–170)
Saturation Ratios: 22 % (ref 10.4–31.8)
TIBC: 386 ug/dL (ref 250–450)
UIBC: 300 ug/dL

## 2017-07-21 LAB — CBC
HEMATOCRIT: 35.6 % — AB (ref 36.0–46.0)
HEMOGLOBIN: 11.2 g/dL — AB (ref 12.0–15.0)
MCH: 26.4 pg (ref 26.0–34.0)
MCHC: 31.5 g/dL (ref 30.0–36.0)
MCV: 84 fL (ref 78.0–100.0)
Platelets: 219 10*3/uL (ref 150–400)
RBC: 4.24 MIL/uL (ref 3.87–5.11)
RDW: 14.1 % (ref 11.5–15.5)
WBC: 6.8 10*3/uL (ref 4.0–10.5)

## 2017-07-21 LAB — FERRITIN: Ferritin: 325 ng/mL — ABNORMAL HIGH (ref 11–307)

## 2017-07-21 LAB — HIV ANTIBODY (ROUTINE TESTING W REFLEX): HIV Screen 4th Generation wRfx: NONREACTIVE

## 2017-07-21 LAB — MAGNESIUM: Magnesium: 2.1 mg/dL (ref 1.7–2.4)

## 2017-07-21 LAB — GLUCOSE, CAPILLARY
Glucose-Capillary: 166 mg/dL — ABNORMAL HIGH (ref 65–99)
Glucose-Capillary: 143 mg/dL — ABNORMAL HIGH (ref 65–99)

## 2017-07-21 MED ORDER — TRAMADOL HCL 50 MG PO TABS: 50.0000 mg | ORAL_TABLET | Freq: Four times a day (QID) | ORAL | 0 refills | Status: DC | PRN

## 2017-07-21 MED ORDER — ACETAMINOPHEN 500 MG PO TABS: 1000.0000 mg | ORAL_TABLET | Freq: Three times a day (TID) | ORAL | 0 refills | Status: DC

## 2017-07-21 MED ORDER — IBUPROFEN 600 MG PO TABS: 600.0000 mg | ORAL_TABLET | Freq: Four times a day (QID) | ORAL | 2 refills | Status: DC | PRN

## 2017-07-21 MED FILL — IBUPROFEN 600 MG TABLET: 600 | 13 days supply | Qty: 50 | Fill #0

## 2017-07-21 MED FILL — traMADol HCL 50 MG TABS: 50 | 8 days supply | Qty: 30 | Fill #0

## 2017-07-21 NOTE — Progress Notes (Signed)
Patient has been discharged by general surgery, chart reviewed.  No charge note.

## 2017-07-21 NOTE — Anesthesia Postprocedure Evaluation (Signed)
Anesthesia Post Note  Patient: Leslie Martinez  Procedure(s) Performed: LAPAROSCOPIC CHOLECYSTECTOMY WITH INTRAOPERATIVE CHOLANGIOGRAM (N/A )     Patient location during evaluation: PACU Anesthesia Type: General Level of consciousness: awake and alert Pain management: pain level controlled Vital Signs Assessment: post-procedure vital signs reviewed and stable Respiratory status: spontaneous breathing, nonlabored ventilation, respiratory function stable and patient connected to nasal cannula oxygen Cardiovascular status: blood pressure returned to baseline and stable Postop Assessment: no apparent nausea or vomiting Anesthetic complications: no    Last Vitals:  Vitals:   07/21/17 0124 07/21/17 0502  BP: (!) 155/87 (!) 156/85  Pulse: 73 75  Resp: 18 18  Temp: 36.7 C 36.5 C  SpO2: 97% 100%    Last Pain:  Vitals:   07/21/17 0543  TempSrc:   PainSc: Bussey E Brock

## 2017-07-22 NOTE — Discharge Summary (Signed)
Physician Discharge Summary  Patient ID: KEYMORA GRILLOT MRN: [id number] DOB/AGE: 04/16/1965 52 y.o.  Admit date: 07/19/2017 Discharge date: 07/22/2017  Admission Diagnoses: Patient Active Problem List   Diagnosis Date Noted  . Cholecystitis with cholelithiasis 07/20/2017  . Acute cholecystitis   . Obesity, diabetes, and hypertension syndrome ([place])   . History of Prinzmetal angina   . Dysphagia   . Type 2 diabetes mellitus without complication ([place]) 46/96/2952  . Loosening of knee joint prosthesis ([place]) 05/11/2017  . Depression 07/23/2015  . Generalized anxiety disorder 07/23/2015  . Primary osteoarthritis of knee 07/13/2015  . Primary osteoarthritis of left knee 07/11/2015  . 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 Lap Nissen Nov 2013 02/06/2012  . Essential hypertension 01/09/2008  . 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 Asthma, chronic 01/09/2008  . BARRETT'S ESOPHAGUS, HX OF 01/09/2008  . NEPHROLITHIASIS, HX OF 01/09/2008  . DYSPHAGIA UNSPECIFIED 11/26/2007    Discharge Diagnoses:  Active Problems:   Cholecystitis with cholelithiasis   Acute cholecystitis   Obesity, diabetes, and hypertension syndrome ([place])   History of Prinzmetal angina   Dysphagia   Discharged Condition: stable  Hospital Course:  Pt was admitted to the hospital with acute calculous cholecystitis.  She had a laparoscopic cholecystectomy with normal IOC.  She did well post op and was able to have her diet advanced.  She had reasonable pain control with oral medications.  She was passing gas and able to ambulate.  She also was able to void independently.  She was discharged to home in stable condition.    Consults: internal medicine  Significant Diagnostic Studies: labs: see epic  Treatments: surgery: see above   Discharge Exam: Blood pressure (!) 156/85, pulse 75, temperature 97.7 F (36.5 C), temperature source Oral, resp. rate 18, height 5\' 9"  (1.753 m), weight 110.7 kg (244 lb), SpO2 100 %. General  appearance: alert, cooperative and no distress GI: soft, non distended, wounds c/d/i,.  Disposition:   Discharge Instructions    Call MD for:  difficulty breathing, headache or visual disturbances   Complete by:  As directed    Call MD for:  persistant nausea and vomiting   Complete by:  As directed    Call MD for:  redness, tenderness, or signs of infection (pain, swelling, redness, odor or green/yellow discharge around incision site)   Complete by:  As directed    Call MD for:  severe uncontrolled pain   Complete by:  As directed    Call MD for:  temperature >100.4   Complete by:  As directed    Diet - low sodium heart healthy   Complete by:  As directed    Increase activity slowly   Complete by:  As directed      Allergies as of 07/21/2017      Reactions   Adhesive [tape] Hives   Iodine Hives, Rash   Lisinopril Cough   Cephalexin Hives, Rash   Compazine [prochlorperazine Edisylate]    Hyperactivity   Latex Itching   Oxycodone-acetaminophen Itching, Rash      Medication List    STOP taking these medications   aspirin EC 325 MG tablet   ciprofloxacin 500 MG tablet Commonly known as:  CIPRO   metFORMIN 500 MG tablet Commonly known as:  GLUCOPHAGE   metroNIDAZOLE 500 MG tablet Commonly known as:  FLAGYL     TAKE these medications   acetaminophen 500 MG tablet Commonly known as:  TYLENOL Take 2 tablets (1,000 mg total) by mouth every  8 (eight) hours.   albuterol 108 (90 Base) MCG/ACT inhaler Commonly known as:  PROVENTIL HFA;VENTOLIN HFA Inhale 2 puffs into the lungs every 6 (six) hours as needed for wheezing or shortness of breath.   cetirizine 10 MG tablet Commonly known as:  ZYRTEC Take 10 mg by mouth daily as needed for allergies.   ibuprofen 600 MG tablet Commonly known as:  ADVIL,MOTRIN Take 1 tablet (600 mg total) by mouth every 6 (six) hours as needed for headache, mild pain or moderate pain.   lipase/protease/amylase 36000 UNITS Cpep  capsule Commonly known as:  CREON Take 2 capsules with each meal and 1 capsule with a snack daily What changed:    how much to take  how to take this  when to take this  additional instructions   Melatonin 5 MG Tabs Take 5 mg by mouth at bedtime.   omeprazole 40 MG capsule Commonly known as:  PRILOSEC Take 1 capsule (40 mg total) by mouth daily.   oxyCODONE-acetaminophen 5-325 MG tablet Commonly known as:  PERCOCET/ROXICET Take 1 tablet by mouth every 4 (four) hours as needed (for pain.).   sertraline 100 MG tablet Commonly known as:  ZOLOFT Take 1 tablet (100 mg total) by mouth daily.   traMADol 50 MG tablet Commonly known as:  ULTRAM Take 1 tablet (50 mg total) by mouth every 6 (six) hours as needed for moderate pain. What changed:  how much to take      Follow-up Information    [facility], [place] Follow up on 08/03/2017.   Specialty:  General Surgery Why:  Your appointment is at 1:30 PM.  Be at the office 30 minutes early for check in.  Bring photo ID and insurance information with you.  Contact information: [place] [id number] [phone]           Signed: Stark Klein 07/22/2017, 10:38 AM

## 2017-07-26 ENCOUNTER — Ambulatory Visit (HOSPITAL_COMMUNITY): Payer: No Typology Code available for payment source

## 2017-08-06 ENCOUNTER — Encounter: Payer: Self-pay | Admitting: Gastroenterology

## 2017-08-09 ENCOUNTER — Telehealth: Payer: Self-pay

## 2017-08-09 MED FILL — MELOXICAM 15 MG TABLET: 15 | 30 days supply | Qty: 30 | Fill #0

## 2017-08-09 NOTE — Telephone Encounter (Signed)
Pt states she fell 3 days ago and bumped her head on steps. Wanted to know what symptoms of a concussion to look for. Advised some symptoms of concussion she may experience now (3 days later) may include HA, dizziness or confusion. Pt denies any symptoms other than swelling where she bumped her head. Advised if change in sx or continued to call back and schedule appt.  Wallace Cullens, RN

## 2017-08-30 NOTE — Therapy (Deleted)
[name], [place], [address] Phone: [phone]   Fax:  [phone]  Physical Therapy Treatment  Patient Details  Name: Leslie Martinez MRN: [id number] Date of Birth: [birth year] Referring Provider: Dr. Frederik Pear    Encounter Date: 07/13/2017    Past Medical History:  Diagnosis Date  . Anxiety   . Asthma    seasonal allergies, dry cough today   . 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 Cervical cancer ([place])    "stage III; discovered S/P hysterectomy" (05/12/2017)  . Chronic bronchitis ([place])   . Depression   . Esophageal stricture   . Esophagitis   . Gastritis   . GERD (gastroesophageal reflux disease)   . Hiatal hernia    history of, treated with surgery  . History of blood transfusion 1990s   "related to childbirth"  . History of kidney stones    passed stones spontaneously, also cystoscopy- done   . Hypertension   . Migraine    "monthly" (05/12/2017)  . Osteoarthritis of both knees   . Pancreatitis   . Pneumonia 2000s X1  . Pre-diabetes   . Prinzmetal variant angina ([place])    1990's  . Seasonal allergies   . Sleep apnea    Had nasal sx, and uses no cpap    Past Surgical History:  Procedure Laterality Date  . APPENDECTOMY    . CARDIAC CATHETERIZATION  1990's X 1  . CHOLECYSTECTOMY N/A 07/20/2017   Procedure: LAPAROSCOPIC CHOLECYSTECTOMY WITH INTRAOPERATIVE CHOLANGIOGRAM;  Surgeon: Excell Seltzer, MD;  Location: [facility];  Service: General;  Laterality: N/A;  . COLONOSCOPY    . CYSTOSCOPY W/ STONE MANIPULATION    . DILATION AND CURETTAGE OF UTERUS    . ENDOMETRIAL ABLATION    . ESOPHAGOGASTRODUODENOSCOPY (EGD) WITH ESOPHAGEAL DILATION  "several times"  . HERNIA REPAIR    . JOINT REPLACEMENT    . KIDNEY POLYPS REMOVED    . KNEE ARTHROSCOPY Bilateral   . LAPAROSCOPIC NISSEN FUNDOPLICATION  50/12/3265   Procedure: LAPAROSCOPIC NISSEN FUNDOPLICATION;  Surgeon: Pedro Earls, MD;  Location: [facility];   Service: General;  Laterality: N/A;  . NASAL SEPTUM SURGERY  2000s   "w/adenoids and hyoid bone removed"  . REVISION TOTAL KNEE ARTHROPLASTY Left 05/12/2017  . TONSILLECTOMY  2004   T&A- due to sleep apnea   . TOTAL KNEE ARTHROPLASTY Left 07/13/2015   Procedure: TOTAL KNEE ARTHROPLASTY;  Surgeon: Frederik Pear, MD;  Location: [facility];  Service: Orthopedics;  Laterality: Left;  . TOTAL KNEE REVISION Left 05/12/2017   Procedure: TOTAL KNEE REVISION;  Surgeon: Frederik Pear, MD;  Location: [facility];  Service: Orthopedics;  Laterality: Left;  . TUBAL LIGATION    . UPPER GASTROINTESTINAL ENDOSCOPY    . UPPER GI ENDOSCOPY  02/03/2012   Procedure: UPPER GI ENDOSCOPY;  Surgeon: Pedro Earls, MD;  Location: [facility];  Service: General;;  . VAGINAL HYSTERECTOMY    . WISDOM TOOTH EXTRACTION      Vitals:   07/13/17 1324  BP: (!) 158/90  Pulse: 68  Temp: 97.6 F (36.4 C)  TempSrc: Oral  SpO2: 98%  Weight: 244 lb (110.7 kg)  Height: '5\' 8"'$  (1.727 m)                                            Patient will benefit from skilled therapeutic  intervention in order to improve the following deficits and impairments:     Visit Diagnosis: Acute bilateral upper abdominal pain - Plan: CBC with Differential/Platelet, CMP14+EGFR, Lipase, DG Abd 2 Views     Problem List Patient Active Problem List   Diagnosis Date Noted  . Cholecystitis with cholelithiasis 07/20/2017  . Acute cholecystitis   . Obesity, diabetes, and hypertension syndrome ([place])   . History of Prinzmetal angina   . Dysphagia   . Type 2 diabetes mellitus without complication ([place]) 21/02/7355  . Loosening of knee joint prosthesis ([place]) 05/11/2017  . Depression 07/23/2015  . Generalized anxiety disorder 07/23/2015  . Primary osteoarthritis of knee 07/13/2015  . Primary osteoarthritis of left knee 07/11/2015  . 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 Lap Nissen Nov 2013 02/06/2012  . Essential hypertension  01/09/2008  . 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 Asthma, chronic 01/09/2008  . BARRETT'S ESOPHAGUS, HX OF 01/09/2008  . NEPHROLITHIASIS, HX OF 01/09/2008  . DYSPHAGIA UNSPECIFIED 11/26/2007    Leslie Martinez 08/30/2017, 1:18 PM  Leslie Martinez [address] [place], [place], [address] Phone: [phone]   Fax:  [phone]  Name: Leslie Martinez MRN: [id number] Date of Birth: [birth year]

## 2017-10-26 MED FILL — ERYTHROMYCIN 0.5% EYE OINT: 5 | 5 days supply | Qty: 4 | Fill #0

## 2018-04-16 ENCOUNTER — Ambulatory Visit (INDEPENDENT_AMBULATORY_CARE_PROVIDER_SITE_OTHER): Payer: Commercial Managed Care - PPO | Admitting: Family Medicine

## 2018-04-16 VITALS — BP 142/82 | HR 67 | Temp 98.3°F | Wt 250.0 lb

## 2018-04-16 DIAGNOSIS — F329 Major depressive disorder, single episode, unspecified: Secondary | ICD-10-CM | POA: Diagnosis not present

## 2018-04-16 DIAGNOSIS — Z23 Encounter for immunization: Secondary | ICD-10-CM

## 2018-04-16 DIAGNOSIS — F32A Depression, unspecified: Secondary | ICD-10-CM

## 2018-04-16 DIAGNOSIS — H1012 Acute atopic conjunctivitis, left eye: Secondary | ICD-10-CM | POA: Diagnosis not present

## 2018-04-16 DIAGNOSIS — I10 Essential (primary) hypertension: Secondary | ICD-10-CM | POA: Diagnosis not present

## 2018-04-16 MED ORDER — DOXYCYCLINE HYCLATE 100 MG PO TABS: 100.0000 mg | ORAL_TABLET | Freq: Two times a day (BID) | ORAL | 0 refills | Status: DC

## 2018-04-16 MED ORDER — OLOPATADINE HCL 0.1 % OP SOLN: 1.0000 [drp] | Freq: Two times a day (BID) | OPHTHALMIC | 12 refills | Status: DC

## 2018-04-16 MED ORDER — OLMESARTAN MEDOXOMIL-HCTZ 40-25 MG PO TABS: 1.0000 | ORAL_TABLET | Freq: Every day | ORAL | 0 refills | Status: DC

## 2018-04-16 MED ORDER — SERTRALINE HCL 100 MG PO TABS: 100.0000 mg | ORAL_TABLET | Freq: Every day | ORAL | 0 refills | Status: DC

## 2018-04-16 NOTE — Assessment & Plan Note (Signed)
  Patient reports she takes her BP medication rarely. She needs refill on this. She is on benicar 40-25. BP 142/82 today. She states she intends to take this daily from now on. Follow up w/ PCP 2 weeks

## 2018-04-16 NOTE — Assessment & Plan Note (Signed)
  Advised pt to discontinue erythromycin ointment as this appears to have irritated eye. rx for pataday drops sent in to pharmacy. Will give doxycycline to avoid preseptal cellulitis as patient's eye is quite swollen today. Follow up w/ PCP 1/24

## 2018-04-16 NOTE — Progress Notes (Signed)
    Subjective:    Patient ID: Leslie Martinez, female    DOB: [birth year], 53 y.o.   MRN: [id number]   CC: eye swollen  HPI: patient reports her left eye has been bothering her the past week- she thought she had a stye so she was using warm compresses and erythromycin ointment she had leftover. She reports things seemed to be getting better until yesterday when she woke up with her left eye swollen shut and pus coming out of it. She reports some blurred vision and gritty feeling in eye. She has bad allergies. She denies eye pain.   She also needs refill on her zoloft and BP medications.   Smoking status reviewed- non-smoker  Review of Systems- see HPI, additionally no fevers or chills, chest pain, SOB   Objective:  BP (!) 142/82   Pulse 67   Temp 98.3 F (36.8 C) (Oral)   Wt 250 lb (113.4 kg)   SpO2 97%   BMI 36.92 kg/m  Vitals and nursing note reviewed  General: well nourished, in no acute distress HEENT: normocephalic. PERRLA, EOMI. Conjunctiva of left eye edematous and erythematous. No discharge noted. Right eye normal. There is edema of the eyelid and infraorbital space of left eye, non-tender to palpation Cardiac: RRR, clear S1 and S2, no murmurs, rubs, or gallops Respiratory: clear to auscultation bilaterally, no increased work of breathing Extremities: no edema or cyanosis. Neuro: alert and oriented, no focal deficits   Assessment & Plan:    Allergic conjunctivitis of left eye  Advised pt to discontinue erythromycin ointment as this appears to have irritated eye. rx for pataday drops sent in to pharmacy. Will give doxycycline to avoid preseptal cellulitis as patient's eye is quite swollen today. Follow up w/ PCP 1/24  Depression  Chronic, stable, on zoloft 100 mg daily refilled this today, follow up w/ pcp 2 weeks  Essential hypertension  Patient reports she takes her BP medication rarely. She needs refill on this. She is on benicar 40-25. BP 142/82 today. She  states she intends to take this daily from now on. Follow up w/ PCP 2 weeks    Return in about 2 weeks (around 04/30/2018).   Lucila Maine, DO Family Medicine Resident PGY-3

## 2018-04-16 NOTE — Patient Instructions (Signed)
  Please use the eye drops twice a day in your left eye for 7-10 days  Take the antibiotic twice a day for 10 days.  Your PCP will see you 1/24 at 1:30 pm to follow up on blood pressure and other issues.   If you have questions or concerns please do not hesitate to call at [phone].  Lucila Maine, DO PGY-3, [facility] 04/16/2018 11:22 AM

## 2018-04-16 NOTE — Assessment & Plan Note (Signed)
  Chronic, stable, on zoloft 100 mg daily refilled this today, follow up w/ pcp 2 weeks

## 2018-04-18 ENCOUNTER — Ambulatory Visit: Payer: No Typology Code available for payment source | Admitting: Physician Assistant

## 2018-04-25 ENCOUNTER — Ambulatory Visit: Payer: No Typology Code available for payment source | Admitting: Physician Assistant

## 2018-04-27 ENCOUNTER — Ambulatory Visit: Payer: No Typology Code available for payment source | Admitting: Family Medicine

## 2018-06-08 ENCOUNTER — Encounter: Payer: Self-pay | Admitting: Family Medicine

## 2018-06-08 ENCOUNTER — Ambulatory Visit: Payer: No Typology Code available for payment source | Admitting: Family Medicine

## 2018-06-08 ENCOUNTER — Ambulatory Visit (INDEPENDENT_AMBULATORY_CARE_PROVIDER_SITE_OTHER): Payer: Commercial Managed Care - PPO | Admitting: Licensed Clinical Social Worker

## 2018-06-08 ENCOUNTER — Other Ambulatory Visit: Payer: Self-pay

## 2018-06-08 VITALS — BP 158/84 | Temp 97.8°F | Ht 69.0 in | Wt 250.0 lb

## 2018-06-08 DIAGNOSIS — I10 Essential (primary) hypertension: Secondary | ICD-10-CM

## 2018-06-08 DIAGNOSIS — E119 Type 2 diabetes mellitus without complications: Secondary | ICD-10-CM | POA: Diagnosis not present

## 2018-06-08 DIAGNOSIS — F329 Major depressive disorder, single episode, unspecified: Secondary | ICD-10-CM | POA: Diagnosis not present

## 2018-06-08 DIAGNOSIS — F32A Depression, unspecified: Secondary | ICD-10-CM

## 2018-06-08 DIAGNOSIS — R197 Diarrhea, unspecified: Secondary | ICD-10-CM | POA: Insufficient documentation

## 2018-06-08 DIAGNOSIS — Z1239 Encounter for other screening for malignant neoplasm of breast: Secondary | ICD-10-CM | POA: Insufficient documentation

## 2018-06-08 DIAGNOSIS — F4323 Adjustment disorder with mixed anxiety and depressed mood: Secondary | ICD-10-CM | POA: Insufficient documentation

## 2018-06-08 LAB — POCT GLYCOSYLATED HEMOGLOBIN (HGB A1C): HBA1C, POC (CONTROLLED DIABETIC RANGE): 6.1 % (ref 0.0–7.0)

## 2018-06-08 MED ORDER — SERTRALINE HCL 100 MG PO TABS: 150.0000 mg | ORAL_TABLET | Freq: Every day | ORAL | 3 refills | Status: DC

## 2018-06-08 NOTE — Assessment & Plan Note (Addendum)
Acute on chronic worsening with component of adjustment disorder.  Warm handoff with integrative behavioral health today.  Patient will have follow-up with Casimer Lanius next week.   -Return precautions given, patient voiced understanding -Increase Zoloft to 150 mg daily -Follow-up with me in 2 weeks

## 2018-06-08 NOTE — Assessment & Plan Note (Signed)
Chronic, elevated today.  Patient reports that she takes Benicar daily.  BP 158/84 today.  Patient notes that she is under a lot of stress at this time, therefore this could be because of elevated BP today.  Will recheck at next visit in 2 weeks. -Continue Benicar -Recheck BP in 2 weeks
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
## 2018-06-08 NOTE — Assessment & Plan Note (Signed)
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  Patient to keep diary of foods and diarrhea occurrences and return in 2 weeks with this information.  Patient has followed with GI in the past and was told to stop taking her pancreatic enzymes, but she states that she has thought about calling them to schedule an appointment. -Return in 2 weeks with food/diarrhea diary -Consider GI referral at that time -Patient educated on avoiding greasy and high fat foods

## 2018-06-08 NOTE — Assessment & Plan Note (Signed)
A1c 6.1.  Well-controlled with diet only. -A1c yearly -Diet controlled

## 2018-06-08 NOTE — BH Specialist Note (Signed)
Integrated Behavioral Health Initial Visit  MRN: [id number] Name: Leslie Martinez   Number of [place] Clinician visits: 1/6 Session Start time: 10:35  Session End time: 10:50 Total time: 15 minutes Type of Service: Integrated Behavioral Health  Warm Hand Off Completed.     Patient verbally consented to meet with [facility] Consultant about presenting concerns. SUBJECTIVE: Leslie Martinez is a 53 y.o. female referred by Dr. Sandi Carne for assistance with managing symptoms of depression and anxiety.  Reports patient has GAD and PHQ-9 of 20 today  with no SI or HI.   ASSESSMENT: Mood: Euthymic and Affect: Appropriate.  Patient is pleasant, soft spoken and engaged in conversation.  She has a history of depression which has been well controlled until recent death of brother and recent accident with grandchild.  Patient is currently experiencing symptoms of  Acute on chronic depression which are exacerbated by traumatic life events. Patient may benefit from and is in agreement to receive further assessment and brief therapeutic interventions to assist with managing her symptoms.  GOALS: 1. Reduce symptoms of: anxiety and depression 2. Increase knowledge and/or ability of: coping skills and self-management skills  Plan:  1. Patient will implement relaxed breathing 3 time daily 2.  Will F/U with LCSW in one week  (Will focus on sleep hygiene during next visit)  _______________________________________________________  Duration of CURRENT symptoms:started in Jan. 2020 Impact on function:difficult with daily functions  Psychiatric History - Diagnoses:Depression - Pharmacotherapy: Sertraline 100mg   Will increase to 150mg  today (also taking 6mg  of Melatonin)  - Outpatient therapy: about 6 months when lived in [place],  Risk of harm to self or others: No plan to harm self or others  LIFE CONTEXT: School/Work: works FT with [employer] Life Changes: loss of brother and  grandchild in hospital   INTERVENTION: Client interviewed and appropriate assessments performed. Provided client with information about as well as demonstration of relaxed breathing ; also spent time on engagement with patient. Explained IBH as brief intervention verses traditional therapy. Other intervention include:  Mindfulness or Relaxation Training and Supportive Counseling,      Braydee Shimkus, [place]   [phone] 11:09 AM

## 2018-06-08 NOTE — Patient Instructions (Signed)
Thank you for coming to see me today. It was a pleasure. Today we talked about:   Your mood: I will increase your Zoloft.  You should start to see some improvement in your mood in the next 2 weeks, with maximum improvement in the next 4-6 weeks.  If you have thoughts about hurting yourself of others, that is a reason to be seen right away.  Your diarrhea:  Keep a journal of the food that you eat and when you have diarrhea.  Bring that with you to your next visit.  Please follow-up with me in 1-2 weeks.  If you have any questions or concerns, please do not hesitate to call the office at [phone].  Best,   Ari[place] Constable, DO

## 2018-06-08 NOTE — Assessment & Plan Note (Signed)
Mammogram ordered and patient provided with scheduling information.

## 2018-06-14 ENCOUNTER — Ambulatory Visit: Payer: Commercial Managed Care - PPO | Admitting: Licensed Clinical Social Worker

## 2018-06-14 ENCOUNTER — Other Ambulatory Visit: Payer: Self-pay

## 2018-06-14 DIAGNOSIS — F329 Major depressive disorder, single episode, unspecified: Secondary | ICD-10-CM

## 2018-06-14 DIAGNOSIS — F32A Depression, unspecified: Secondary | ICD-10-CM

## 2018-06-14 NOTE — Patient Instructions (Signed)
It was a pleasure seeing you today.  Please review the material I have given you on sleep hygiene and the depression action plan.  Today we discussed your goals and various options to assist you with accomplishing them.    Follow up with me in 2 or 3 weeks based on your work schedule and continue relaxed breathing 3 times a day.

## 2018-06-14 NOTE — BH Specialist Note (Signed)
Integrated Behavioral Health Follow Up Visit  MRN: [id number] Name: Leslie Martinez  Number of [facility] Clinician visits: 2/6 Session Start time: 1:30  Session End time: 2::15 Total time: 45 minutes  Reason for follow-up: Continue brief intervention to assist patient with managing symptoms of anxiety and depression,  Report of symptoms: trouble concentrating ;  Mind racing at night; afraid something awful might happen and worrying too much. As well as somewhat difficulty in daily functions. She has noticed improvement in her sleep quality, sleeping about 6 to 7 hours at night.   ASSESSMENT: Mood: Euthymic and Affect: Appropriate;Thought process: Coherent;  No plan to harm self or others.  Patient is making progress towards goal.  States she is starting to feel better. This is also evident by decrease in PHQ-9/GAD scores below.  Patient is taking Zoloft daily and reports no side effects, she has also implemented relaxed breathing and states this has also helped.  Patient's work schedule has change which will allow her more personal time and spending time with grandchildren (77 and 4) that live with her.  Patient continues to experience symptoms of depression and anxiety which was exacerbated by life transitions and recent deaths .    She may benefit from, and is in agreement to continue ongoing assessment and brief therapeutic interventions to assist with managing her symptoms.  GOALS:Patient will: 1. Reduce symptoms of: anxiety and depression 2. Increase knowledge and/or ability of: coping skills and self-management skills  3.   Schedule time for things she enjoys PLAN:   1.Patient will F/U with [facility] in 2 weeks 2.Behavioral recommendations: continue relaxed breathing, sleep hygiene, and self-care action plan (walking) 3. Referral: none at this time Intervention: Client interviewed and appropriate assessments performed. Provided client with information about sleep hygiene,  education on depression ; also spent time on engagement with patient.  Other intervention include:,Supportive Counseling, Reflective listening, Behavioral Therapy (Relaxed breathing); Psychoeducation    GAD 7 : Generalized Anxiety Score 06/14/2018 06/08/2018  Nervous, Anxious, on Edge 1 3  Control/stop worrying 2 3  Worry too much - different things 1 3  Trouble relaxing 1 3  Restless 1 2  Easily annoyed or irritable 1 3  Afraid - awful might happen 2 3  Total GAD 7 Score 9 20  Anxiety Difficulty Somewhat difficult Very difficult   Depression screen [facility] 2/9 06/14/2018 06/08/2018 04/16/2018  Decreased Interest 1 2 0  Down, Depressed, Hopeless 1 3 0  PHQ - 2 Score 2 5 0  Altered sleeping 1 3 -  Tired, decreased energy 1 3 -  Change in appetite 2 2 -  Feeling bad or failure about yourself  1 2 -  Trouble concentrating 2 2 -  Moving slowly or fidgety/restless 0 3 -  Suicidal thoughts 0 0 -  PHQ-9 Score 9 20 -  Difficult doing work/chores Somewhat difficult Very difficult -   Leslie Martinez, [place]   [phone] 3:50 PM

## 2018-06-26 ENCOUNTER — Telehealth: Payer: Self-pay | Admitting: Licensed Clinical Social Worker

## 2018-06-26 NOTE — Telephone Encounter (Signed)
   Outreach Note  06/26/2018 Name: Leslie Martinez MRN: [id number] DOB: 02-12-1966  Referred by: Cleophas Dunker, DO assistance with managing symptoms of depression.  Reason for referral : Other ([facility] F/U call)  An unsuccessful telephone outreach to patient was attempted today ref. F/U to see how patient is doing due to cancellation of last office visit. Left message for patient to call LCSW if needed.  Follow Up Plan: LCSW will wait for return call.  Casimer Lanius, [facility]   [phone] 11:51 AM

## 2018-06-27 ENCOUNTER — Ambulatory Visit: Payer: Commercial Managed Care - PPO

## 2018-09-18 ENCOUNTER — Encounter: Payer: Self-pay | Admitting: *Deleted

## 2018-09-19 ENCOUNTER — Other Ambulatory Visit: Payer: Self-pay

## 2018-09-19 ENCOUNTER — Encounter: Payer: Self-pay | Admitting: Gastroenterology

## 2018-09-19 ENCOUNTER — Other Ambulatory Visit: Payer: 59

## 2018-09-19 ENCOUNTER — Ambulatory Visit (INDEPENDENT_AMBULATORY_CARE_PROVIDER_SITE_OTHER): Payer: 59 | Admitting: Gastroenterology

## 2018-09-19 VITALS — Ht 69.0 in | Wt 245.0 lb

## 2018-09-19 DIAGNOSIS — R197 Diarrhea, unspecified: Secondary | ICD-10-CM | POA: Diagnosis not present

## 2018-09-19 DIAGNOSIS — K219 Gastro-esophageal reflux disease without esophagitis: Secondary | ICD-10-CM

## 2018-09-19 DIAGNOSIS — R1310 Dysphagia, unspecified: Secondary | ICD-10-CM

## 2018-09-19 DIAGNOSIS — Z9049 Acquired absence of other specified parts of digestive tract: Secondary | ICD-10-CM

## 2018-09-19 DIAGNOSIS — Z9889 Other specified postprocedural states: Secondary | ICD-10-CM | POA: Diagnosis not present

## 2018-09-19 MED ORDER — CHOLESTYRAMINE LIGHT 4 G PO PACK: PACK | ORAL | 3 refills | Status: DC

## 2018-09-19 NOTE — Progress Notes (Addendum)
JAEDA BRUSO    [id number]    27-Feb-1966  Primary Care Physician:Meccariello, Bernita Raisin, DO  Referring Physician: Cleophas Dunker, DO [place] [address],  [place] [address]  This service was provided via audio and video telemedicine (Doximity) due to [place] 19 pandemic.  Patient location: Home Provider location: Office Used 2 patient identifiers to confirm the correct person. Explained the limitations in evaluation and management via telemedicine. Patient is aware of potential medical charges for this visit.  Patient consented to this virtual visit.  The persons participating in this telemedicine service were myself and the patient   Time spent on call: 22 minutes  Chief complaint:  Diarrhea, dysphagia, GERD HPI: 53 year old female with history of chronic GERD, status post Nissen fundoplication in 4431 here for follow-up visit with complaints of persistent GERD symptoms and solid dysphagia.  Last seen in 2018.  She had multiple EGDs with dilation status post Nissen with no significant improvement.  Last EGD and colonoscopy in May 2018 showed loose Nissen wrap, hiatal hernia and esophageal ulcer.  Random colon biopsies negative for microscopic colitis, inadequate prep.  She was having intermittent diarrhea but after cholecystectomy she is having increased frequency with 5-6  Bowel movements per day.  Denies any melena, mucus or blood per rectum. Negative for celiac, GI pathogen panel negative.  Normal fecal fat, decreased pancreatic elastase 21  Patient has had multiple EGDs with evidence of esophageal stricture in 2009, 2011, 2013, 2015 in 2016. On most recent EGD in 2016 she underwent savary dilation to 17 mm. Barium swallow in 2011 showed normal esophageal motility and evidence of distal esophageal stricture. Swallowing difficulty worse with solids or rice especially after Nissen fundoplication.   Outpatient Encounter Medications as of 09/19/2018  Medication  Sig  . cetirizine (ZYRTEC) 10 MG tablet Take 10 mg by mouth daily as needed for allergies.  . Melatonin 5 MG TABS Take 5 mg by mouth at bedtime.  Marland Kitchen olmesartan-hydrochlorothiazide (BENICAR HCT) 40-25 MG tablet Take 1 tablet by mouth daily.  Marland Kitchen omeprazole (PRILOSEC) 40 MG capsule Take 1 capsule (40 mg total) by mouth daily.  . sertraline (ZOLOFT) 100 MG tablet Take 1.5 tablets (150 mg total) by mouth daily.   No facility-administered encounter medications on file as of 09/19/2018.     Allergies as of 09/19/2018 - Review Complete 09/19/2018  Allergen Reaction Noted  . Adhesive [tape] Hives 07/03/2015  . Iodine Hives and Rash 11/26/2007  . Lisinopril Cough 11/02/2011  . Cephalexin Hives and Rash 11/26/2007  . Compazine [prochlorperazine edisylate]  11/26/2007  . Latex Itching 07/03/2015  . Oxycodone-acetaminophen Itching and Rash 07/17/2009    Past Medical History:  Diagnosis Date  . Anxiety   . Asthma    seasonal allergies, dry cough today   . 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 Cervical cancer ([place])    "stage III; discovered S/P hysterectomy" (05/12/2017)  . Chronic bronchitis ([place])   . Depression   . Esophageal stricture   . Esophagitis   . Gastritis   . GERD (gastroesophageal reflux disease)   . Hiatal hernia    history of, treated with surgery  . History of blood transfusion 1990s   "related to childbirth"  . History of kidney stones    passed stones spontaneously, also cystoscopy- done   . Hypertension   . Migraine    "monthly" (05/12/2017)  . Osteoarthritis of both knees   . Pancreatitis   .  Pneumonia 2000s X1  . Pre-diabetes   . 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 Seasonal allergies   . Sleep apnea    Had nasal sx, and uses no cpap    Past Surgical History:  Procedure Laterality Date  . APPENDECTOMY    . CARDIAC CATHETERIZATION  1990's X 1  . CHOLECYSTECTOMY N/A 07/20/2017   Procedure: LAPAROSCOPIC CHOLECYSTECTOMY WITH INTRAOPERATIVE CHOLANGIOGRAM;  Surgeon:  Excell Seltzer, MD;  Location: [facility];  Service: General;  Laterality: N/A;  . COLONOSCOPY    . CYSTOSCOPY W/ STONE MANIPULATION    . DILATION AND CURETTAGE OF UTERUS    . ENDOMETRIAL ABLATION    . ESOPHAGOGASTRODUODENOSCOPY (EGD) WITH ESOPHAGEAL DILATION  "several times"  . HERNIA REPAIR    . JOINT REPLACEMENT    . KIDNEY POLYPS REMOVED    . KNEE ARTHROSCOPY Bilateral   . LAPAROSCOPIC NISSEN FUNDOPLICATION  25/11/5275   Procedure: LAPAROSCOPIC NISSEN FUNDOPLICATION;  Surgeon: Pedro Earls, MD;  Location: [facility];  Service: General;  Laterality: N/A;  . NASAL SEPTUM SURGERY  2000s   "w/adenoids and hyoid bone removed"  . REVISION TOTAL KNEE ARTHROPLASTY Left 05/12/2017  . TONSILLECTOMY  2004   T&A- due to sleep apnea   . TOTAL KNEE ARTHROPLASTY Left 07/13/2015   Procedure: TOTAL KNEE ARTHROPLASTY;  Surgeon: Frederik Pear, MD;  Location: [facility];  Service: Orthopedics;  Laterality: Left;  . TOTAL KNEE REVISION Left 05/12/2017   Procedure: TOTAL KNEE REVISION;  Surgeon: Frederik Pear, MD;  Location: [facility];  Service: Orthopedics;  Laterality: Left;  . TUBAL LIGATION    . UPPER GASTROINTESTINAL ENDOSCOPY    . UPPER GI ENDOSCOPY  02/03/2012   Procedure: UPPER GI ENDOSCOPY;  Surgeon: Pedro Earls, MD;  Location: [facility];  Service: General;;  . VAGINAL HYSTERECTOMY    . WISDOM TOOTH EXTRACTION      Family History  Problem Relation Age of Onset  . Heart disease Mother   . Heart disease Father   . Diabetes Father   . Colon cancer Maternal Grandmother   . Colon polyps Neg Hx   . Esophageal cancer Neg Hx   . Rectal cancer Neg Hx   . Stomach cancer Neg Hx     Social History   Socioeconomic History  . Marital status: Divorced    Spouse name: Not on file  . Number of children: 3  . Years of education: Not on file  . Highest education level: Not on file  Occupational History  . Occupation: LPN  Social Needs  . Financial resource strain: Not on file  . Food insecurity    Worry:  Not on file    Inability: Not on file  . Transportation needs    Medical: Not on file    Non-medical: Not on file  Tobacco Use  . Smoking status: Never Smoker  . Smokeless tobacco: Never Used  Substance and Sexual Activity  . Alcohol use: No    Frequency: Never  . Drug use: No  . Sexual activity: Not Currently  Lifestyle  . Physical activity    Days per week: Not on file    Minutes per session: Not on file  . Stress: Not on file  Relationships  . Social Herbalist on phone: Not on file    Gets together: Not on file    Attends religious service: Not on file    Active member of club or organization: Not on file  Attends meetings of clubs or organizations: Not on file    Relationship status: Not on file  . Intimate partner violence    Fear of current or ex partner: Not on file    Emotionally abused: Not on file    Physically abused: Not on file    Forced sexual activity: Not on file  Other Topics Concern  . Not on file  Social History Narrative  . Not on file      Review of systems: Review of Systems as per HPI All other systems reviewed and are negative.   Physical Exam: Vitals were not taken and physical exam was not performed during this virtual visit.  Data Reviewed:  Reviewed labs, radiology imaging, old records and pertinent past GI work up   Assessment and Plan/Recommendations:  53 year old female with history of chronic GERD status post Nissen fundoplication with complaints of persistent heartburn and solid dysphagia Based on last EGD 2018 she had findings suggestive of slipped Nissen and also esophageal ulcers Repeat EGD to document ulcer healing, exclude neoplastic lesion May need to redo/repair of Nissen fundoplication Continue omeprazole daily Antireflux measures  Diarrhea: Colonoscopy with biopsies to exclude microscopic colitis Decreased fecal elastase 2018, will recheck fecal fat and elastase Trial of Prevalite half packet twice  daily for possible bile salt induced diarrhea  The risks and benefits as well as alternatives of endoscopic procedure(s) have been discussed and reviewed. All questions answered. The patient agrees to proceed.  Damaris Hippo , MD   CC: Meccariello, Bernita Raisin, *

## 2018-09-19 NOTE — Patient Instructions (Addendum)
Check fecal fat qualitative and fecal elastase  Start Prevalite half packet twice daily with meals X 30 days with 3 refills  Avoid taking Prevalite within 1 to 2 hours after the medication and will need to wait 3 to 4 hours after taking Prevalite to take any other medication  Schedule EGD and colonoscopy at [facility]   You have been scheduled for an endoscopy and colonoscopy. Please follow the written instructions given to you at your visit today. Please pick up your prep supplies at the pharmacy within the next 1-3 days. If you use inhalers (even only as needed), please bring them with you on the day of your procedure.  Plenvu sample kit given to patient   I appreciate the  opportunity to care for you  Thank You   Kavitha Nandigam , MD  

## 2018-09-24 ENCOUNTER — Telehealth: Payer: Self-pay | Admitting: Gastroenterology

## 2018-09-24 NOTE — Telephone Encounter (Signed)

## 2018-09-25 ENCOUNTER — Encounter: Payer: Self-pay | Admitting: Gastroenterology

## 2018-09-25 ENCOUNTER — Ambulatory Visit (AMBULATORY_SURGERY_CENTER): Payer: 59 | Admitting: Gastroenterology

## 2018-09-25 ENCOUNTER — Other Ambulatory Visit: Payer: Self-pay

## 2018-09-25 VITALS — BP 131/74 | HR 60 | Temp 98.5°F | Resp 15 | Ht 69.0 in | Wt 245.0 lb

## 2018-09-25 DIAGNOSIS — K2980 Duodenitis without bleeding: Secondary | ICD-10-CM

## 2018-09-25 DIAGNOSIS — K648 Other hemorrhoids: Secondary | ICD-10-CM | POA: Diagnosis not present

## 2018-09-25 DIAGNOSIS — K2210 Ulcer of esophagus without bleeding: Secondary | ICD-10-CM | POA: Diagnosis not present

## 2018-09-25 DIAGNOSIS — K5730 Diverticulosis of large intestine without perforation or abscess without bleeding: Secondary | ICD-10-CM

## 2018-09-25 DIAGNOSIS — K219 Gastro-esophageal reflux disease without esophagitis: Secondary | ICD-10-CM | POA: Diagnosis present

## 2018-09-25 DIAGNOSIS — R1310 Dysphagia, unspecified: Secondary | ICD-10-CM

## 2018-09-25 DIAGNOSIS — K449 Diaphragmatic hernia without obstruction or gangrene: Secondary | ICD-10-CM | POA: Diagnosis not present

## 2018-09-25 DIAGNOSIS — R1319 Other dysphagia: Secondary | ICD-10-CM

## 2018-09-25 DIAGNOSIS — R197 Diarrhea, unspecified: Secondary | ICD-10-CM

## 2018-09-25 DIAGNOSIS — K2970 Gastritis, unspecified, without bleeding: Secondary | ICD-10-CM

## 2018-09-25 MED ORDER — SODIUM CHLORIDE 0.9 % IV SOLN: 500.0000 mL | Freq: Once | INTRAVENOUS | Status: DC

## 2018-09-25 NOTE — Progress Notes (Signed)
To PACU, VSS. Report to [email or web address] 

## 2018-09-25 NOTE — Progress Notes (Signed)
Called to room to assist during endoscopic procedure.  Patient ID and intended procedure confirmed with present staff. Received instructions for my participation in the procedure from the performing physician.  

## 2018-09-25 NOTE — Op Note (Addendum)
[facility] Patient Name: Leslie Martinez Procedure Date: 09/25/2018 8:56 AM MRN: [id number] Endoscopist: Mauri Pole , MD Age: 53 Referring MD:  Date of Birth: 17-Mar-1966 Gender: Female Account #: [id number] Procedure:                Upper GI endoscopy Indications:              Dysphagia, Epigastric abdominal pain, Dyspepsia,                            Heartburn, Esophageal reflux symptoms that persist                            despite appropriate therapy Medicines:                Monitored Anesthesia Care Procedure:                Pre-Anesthesia Assessment:                           - Prior to the procedure, a History and Physical                            was performed, and patient medications and                            allergies were reviewed. The patient's tolerance of                            previous anesthesia was also reviewed. The risks                            and benefits of the procedure and the sedation                            options and risks were discussed with the patient.                            All questions were answered, and informed consent                            was obtained. Prior Anticoagulants: The patient has                            taken no previous anticoagulant or antiplatelet                            agents. ASA Grade Assessment: II - A patient with                            mild systemic disease. After reviewing the risks                            and benefits, the patient was deemed in  satisfactory condition to undergo the procedure.                           After obtaining informed consent, the endoscope was                            passed under direct vision. Throughout the                            procedure, the patient's blood pressure, pulse, and                            oxygen saturations were monitored continuously. The                            Endoscope was introduced  through the mouth, and                            advanced to the second part of duodenum. The upper                            GI endoscopy was accomplished without difficulty.                            The patient tolerated the procedure well. Scope In: Scope Out: Findings:                 Two cratered esophageal ulcers were found 34 to 36                            cm from the incisors. The largest lesion was 4 mm                            in largest dimension. Biopsies were taken with a                            cold forceps for histology.                           A diverticulum with a small opening was found at                            the gastroesophageal junction.                           A 4 cm hiatal hernia was present.                           Evidence of a Nissen fundoplication was found in                            the cardia. The wrap appeared loose and likely  slipped. This was traversed.                           Patchy moderate inflammation characterized by                            congestion (edema), erosions and erythema was found                            in the gastric antrum and in the prepyloric region                            of the stomach. Biopsies were taken with a cold                            forceps for Helicobacter pylori testing.                           The first portion of the duodenum and second                            portion of the duodenum were normal. Biopsies for                            histology were taken with a cold forceps for                            evaluation of celiac disease. Complications:            No immediate complications. Estimated Blood Loss:     Estimated blood loss was minimal. Impression:               - Esophageal ulcers. Biopsied.                           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                           - 4 cm hiatal hernia.                            - A Nissen fundoplication was found. The wrap                            appears loose.                           - Gastritis. Biopsied.                           - Normal first portion of the duodenum and second                            portion of the duodenum. Biopsied. Recommendation:           - Patient has a contact number available for  emergencies. The signs and symptoms of potential                            delayed complications were discussed with the                            patient. Return to normal activities tomorrow.                            Written discharge instructions were provided to the                            patient.                           - Resume previous diet.                           - Continue present medications.                           - Await pathology results.                           - No ibuprofen, naproxen, or other non-steroidal                            anti-inflammatory drugs.                           - Barium esophagogram with tablet                           -Follow up office virtual visit in 4-6 weeks Mauri Pole, MD 09/25/2018 9:41:07 AM This report has been signed electronically.

## 2018-09-25 NOTE — Progress Notes (Signed)
covid screning and temp done by Riki Sheer.  Vital signs done by Rica Mote.

## 2018-09-25 NOTE — Op Note (Signed)
[facility] Patient Name: Leslie Martinez Procedure Date: 09/25/2018 8:56 AM MRN: [id number] Endoscopist: Mauri Pole , MD Age: 53 Referring MD:  Date of Birth: Sep 01, 1965 Gender: Female Account #: [id number] Procedure:                Colonoscopy Indications:              Clinically significant diarrhea of unexplained                            origin Medicines:                Monitored Anesthesia Care Procedure:                Pre-Anesthesia Assessment:                           - Prior to the procedure, a History and Physical                            was performed, and patient medications and                            allergies were reviewed. The patient's tolerance of                            previous anesthesia was also reviewed. The risks                            and benefits of the procedure and the sedation                            options and risks were discussed with the patient.                            All questions were answered, and informed consent                            was obtained. Prior Anticoagulants: The patient has                            taken no previous anticoagulant or antiplatelet                            agents. ASA Grade Assessment: II - A patient with                            mild systemic disease. After reviewing the risks                            and benefits, the patient was deemed in                            satisfactory condition to undergo the procedure.  After obtaining informed consent, the colonoscope                            was passed under direct vision. Throughout the                            procedure, the patient's blood pressure, pulse, and                            oxygen saturations were monitored continuously. The                            Colonoscope was introduced through the anus and                            advanced to the the cecum, identified by               appendiceal orifice and ileocecal valve. The                            colonoscopy was performed without difficulty. The                            patient tolerated the procedure well. The quality                            of the bowel preparation was excellent. The                            ileocecal valve, appendiceal orifice, and rectum                            were photographed. Scope In: 9:13:17 AM Scope Out: 9:27:25 AM Scope Withdrawal Time: 0 hours 6 minutes 28 seconds  Total Procedure Duration: 0 hours 14 minutes 8 seconds  Findings:                 The digital rectal exam findings include decreased                            sphincter tone.                           A few small-mouthed diverticula were found in the                            sigmoid colon and descending colon.                           Normal mucosa was found in the left colon and in                            the right colon. Biopsies for histology were taken  with a cold forceps from the right colon and left                            colon for evaluation of microscopic colitis.                           Non-bleeding internal hemorrhoids were found during                            retroflexion. The hemorrhoids were small. Complications:            No immediate complications. Estimated Blood Loss:     Estimated blood loss was minimal. Impression:               - Decreased sphincter tone found on digital rectal                            exam.                           - Normal mucosa in the left colon and in the right                            colon. Biopsied.                           - Non-bleeding internal hemorrhoids. Recommendation:           - Patient has a contact number available for                            emergencies. The signs and symptoms of potential                            delayed complications were discussed with the                             patient. Return to normal activities tomorrow.                            Written discharge instructions were provided to the                            patient.                           - Resume previous diet.                           - Continue present medications.                           - Await pathology results.                           - Repeat colonoscopy in 10 years for screening  purposes. Mauri Pole, MD 09/25/2018 9:48:41 AM This report has been signed electronically.

## 2018-09-25 NOTE — Patient Instructions (Addendum)
ENDOSCOPY-  NO ASPIRIN, ASPIRIN CONTAINING PRODUCTS (BC OR GOODY POWDERS) OR NSAIDS (IBUPROFEN, ADVIL, ALEVE, AND MOTRIN) ; TYLENOL IS OK TO TAKE  CONTINUE PREVIOUS MEDICATIONS    'INFORMATION ON REFLUX AND GASTRITIS GIVEN TO YOU TODAY   BARIUM ESOPHOGRAM  TO BE ORDERED BY DR NANDIGAM'S OFFICE   FOLLOW UP WITH A VIRTUAL OFFICE VISIT IN 4-6 WEEKS - MAKE THIS APPOINTMENT   AWAIT PATHOLOGY RESULTS   COLONOSCOPY- INFORMATION ON DIVERTICULOSIS & HEMORRHOIDS GIVEN TO YOU TODAY    YOU HAD AN ENDOSCOPIC PROCEDURE TODAY AT [facility]:   Refer to the procedure report that was given to you for any specific questions about what was found during the examination.  If the procedure report does not answer your questions, please call your gastroenterologist to clarify.  If you requested that your care partner not be given the details of your procedure findings, then the procedure report has been included in a sealed envelope for you to review at your convenience later.  YOU SHOULD EXPECT: Some feelings of bloating in the abdomen. Passage of more gas than usual.  Walking can help get rid of the air that was put into your GI tract during the procedure and reduce the bloating. If you had a lower endoscopy (such as a colonoscopy or flexible sigmoidoscopy) you may notice spotting of blood in your stool or on the toilet paper. If you underwent a bowel prep for your procedure, you may not have a normal bowel movement for a few days.  Please Note:  You might notice some irritation and congestion in your nose or some drainage.  This is from the oxygen used during your procedure.  There is no need for concern and it should clear up in a day or so.  SYMPTOMS TO REPORT IMMEDIATELY:   Following lower endoscopy (colonoscopy or flexible sigmoidoscopy):  Excessive amounts of blood in the stool  Significant tenderness or worsening of abdominal pains  Swelling of the abdomen that is new,  acute  Fever of 100F or higher   Following upper endoscopy (EGD)  Vomiting of blood or coffee ground material  New chest pain or pain under the shoulder blades  Painful or persistently difficult swallowing  New shortness of breath  Fever of 100F or higher  Black, tarry-looking stools  For urgent or emergent issues, a gastroenterologist can be reached at any hour by calling [phone].   DIET:  We do recommend a small meal at first, but then you may proceed to your regular diet.  Drink plenty of fluids but you should avoid alcoholic beverages for 24 hours.  ACTIVITY:  You should plan to take it easy for the rest of today and you should NOT DRIVE or use heavy machinery until tomorrow (because of the sedation medicines used during the test).    FOLLOW UP: Our staff will call the number listed on your records 48-72 hours following your procedure to check on you and address any questions or concerns that you may have regarding the information given to you following your procedure. If we do not reach you, we will leave a message.  We will attempt to reach you two times.  During this call, we will ask if you have developed any symptoms of COVID 19. If you develop any symptoms (ie: fever, flu-like symptoms, shortness of breath, cough etc.) before then, please call [phone].  If you test positive for Covid 19 in the 2 weeks post procedure, please call and  report this information to [place].    If any biopsies were taken you will be contacted by phone or by letter within the next 1-3 weeks.  Please call us at [phone] if you have not heard about the biopsies in 3 weeks.    SIGNATURES/CONFIDENTIALITY: You and/or your care partner have signed paperwork which will be entered into your electronic medical record.  These signatures attest to the fact that that the information above on your After Visit Summary has been reviewed and is understood.  Full responsibility of the confidentiality of  this discharge information lies with you and/or your care-partner.

## 2018-09-27 ENCOUNTER — Telehealth: Payer: Self-pay | Admitting: *Deleted

## 2018-09-27 NOTE — Telephone Encounter (Signed)
  Follow up Call-  Call back number 09/25/2018 08/15/2016  Post procedure Call Back phone  # [phone]  Permission to leave phone message Yes Yes  Some recent data might be hidden     Patient questions:  Do you have a fever, pain , or abdominal swelling? No. Pain Score  0 *  Have you tolerated food without any problems? Yes.    Have you been able to return to your normal activities? Yes.    Do you have any questions about your discharge instructions: Diet   No. Medications  No. Follow up visit  No.  Do you have questions or concerns about your Care? No.  Actions: * If pain score is 4 or above: No action needed, pain <4.   1. Have you developed a fever since your procedure? NO  2.   Have you had an respiratory symptoms (SOB or cough) since your procedure? NO  3.   Have you tested positive for COVID 19 since your procedure NO  4.   Have you had any family members/close contacts diagnosed with the COVID 19 since your procedure?  NO   If yes to any of these questions please route to Joylene John, RN and Alphonsa Gin, RN.

## 2018-10-04 ENCOUNTER — Encounter: Payer: Self-pay | Admitting: Gastroenterology

## 2018-10-31 ENCOUNTER — Encounter: Payer: Self-pay | Admitting: *Deleted

## 2018-11-01 ENCOUNTER — Encounter: Payer: Self-pay | Admitting: Gastroenterology

## 2018-11-01 ENCOUNTER — Ambulatory Visit (INDEPENDENT_AMBULATORY_CARE_PROVIDER_SITE_OTHER): Payer: 59 | Admitting: Gastroenterology

## 2018-11-01 VITALS — Ht 69.0 in | Wt 245.0 lb

## 2018-11-01 DIAGNOSIS — K9089 Other intestinal malabsorption: Secondary | ICD-10-CM

## 2018-11-01 DIAGNOSIS — R1319 Other dysphagia: Secondary | ICD-10-CM

## 2018-11-01 DIAGNOSIS — R1310 Dysphagia, unspecified: Secondary | ICD-10-CM

## 2018-11-01 DIAGNOSIS — K219 Gastro-esophageal reflux disease without esophagitis: Secondary | ICD-10-CM

## 2018-11-01 DIAGNOSIS — K9189 Other postprocedural complications and disorders of digestive system: Secondary | ICD-10-CM

## 2018-11-01 MED ORDER — COLESTIPOL HCL 1 G PO TABS: 1.0000 g | ORAL_TABLET | Freq: Two times a day (BID) | ORAL | 1 refills | Status: DC

## 2018-11-01 NOTE — Patient Instructions (Addendum)
Stop Prevalite, switch to Colestid 1 g twice daily  Schedule for esophageal manometry and 24-hour pH study without impedance off PPI for 1 week prior to test ( We will call you back with this appointment)  11/19/2018 at 12:30pm    You have been scheduled for an esophageal manometry at [facility] on 11/19/2018 at 12:30pm. Please arrive 30 minutes prior to your procedure for registration. You will need to go to outpatient registration (1st floor of the hospital) first. Make certain to bring your insurance cards as well as a complete list of medications.  Please remember the following:  Hold PPI 1 week prior to test (Omeprazole)  1) Do not take any muscle relaxants, xanax (alprazolam) or ativan for 1 day prior to your test as well as the day of the test.  2) Nothing to eat or drink for 4 hours before your test.  3) Hold all diabetic medications/insulin the morning of the test. You may eat and take your medications after the test.  It will take at least 2 weeks to receive the results of this test from your physician. ------------------------------------------ ABOUT ESOPHAGEAL MANOMETRY Esophageal manometry (muh-NOM-uh-tree) is a test that gauges how well your esophagus works. Your esophagus is the long, muscular tube that connects your throat to your stomach. Esophageal manometry measures the rhythmic muscle contractions (peristalsis) that occur in your esophagus when you swallow. Esophageal manometry also measures the coordination and force exerted by the muscles of your esophagus.  During esophageal manometry, a thin, flexible tube (catheter) that contains sensors is passed through your nose, down your esophagus and into your stomach. Esophageal manometry can be helpful in diagnosing some mostly uncommon disorders that affect your esophagus.  Why it's done Esophageal manometry is used to evaluate the movement (motility) of food through the esophagus and into the stomach. The test  measures how well the circular bands of muscle (sphincters) at the top and bottom of your esophagus open and close, as well as the pressure, strength and pattern of the wave of esophageal muscle contractions that moves food along.  What you can expect Esophageal manometry is an outpatient procedure done without sedation. Most people tolerate it well. You may be asked to change into a hospital gown before the test starts.  During esophageal manometry  . While you are sitting up, a member of your health care team sprays your throat with a numbing medication or puts numbing gel in your nose or both.  . A catheter is guided through your nose into your esophagus. The catheter may be sheathed in a water-filled sleeve. It doesn't interfere with your breathing. However, your eyes may water, and you may gag. You may have a slight nosebleed from irritation.  . After the catheter is in place, you may be asked to lie on your back on an exam table, or you may be asked to remain seated.  . You then swallow small sips of water. As you do, a computer connected to the catheter records the pressure, strength and pattern of your esophageal muscle contractions.  . During the test, you'll be asked to breathe slowly and smoothly, remain as still as possible, and swallow only when you're asked to do so.  . A member of your health care team may move the catheter down into your stomach while the catheter continues its measurements.  . The catheter then is slowly withdrawn. The test usually lasts 20 to 30 minutes.  After esophageal manometry  When your esophageal  manometry is complete, you may return to your normal activities  This test typically takes 30-45 minutes to complete. ________________________________________________________________________________  Due to recent COVID-19 restrictions implemented by our local and state authorities and in an effort to keep both patients and staff as safe as possible, our hospital  system now requires COVID-19 testing prior to any scheduled hospital procedure. Please go to our [facility] location drive thru testing site ([address] [place], [place] [address]) on 11/15/2018, any time between 9 am - 3 pm (Wednesday hours are 9 am-12 pm and Saturday hours are 9 am-12:30 pm). There will be multiple testing areas, the first checkpoint being for pre-procedure/surgery testing. Get into the right (yellow) lane that leads to the PAT testing team. You will not be billed at the time of testing but may receive a bill later depending on your insurance. The approximate cost of the test is $100. You must agree to quarantine from the time of your testing until the procedure date on 11/15/2018 . This should include staying at home with ONLY the people you live with. Avoid take-out, grocery store shopping or leaving the house for any non-emergent reason. Please call our office at [phone] if you have any questions.   Follow-up office visit in 6 to 8 weeks  I appreciate the  opportunity to care for you  Thank You   Harl Bowie , MD

## 2018-11-01 NOTE — Progress Notes (Signed)
Leslie Martinez    [id number]    1965-12-07  Primary Care Physician:Meccariello, Bernita Raisin, DO  Referring Physician: Cleophas Dunker, DO [place],  [place] [address]  This service was provided via  telemedicine due to [place] 19 pandemic.  I connected with Leslie Martinez on 11/01/18 at  8:30 AM EDT by a video enabled telemedicine application (Doximity) and verified that I am speaking with the correct person using two identifiers.  Patient location: Home Provider location: Office   I discussed the limitations, risks, security and privacy concerns of performing an evaluation and management service by video enabled telemedicine application and the availability of in person appointments. I also discussed with the patient that there may be a patient responsible charge related to this service. The patient expressed understanding and agreed to proceed.   The persons participating in this telemedicine service were myself and the patient     Chief complaint:  Dysphagia  HPI:  53 year old female with history of chronic GERD status post Nissen fundoplication in 3646  She continues to have persistent dysphagia especially to meat, bread and rice. Heartburn worse if she does not take omeprazole or lays down after eating.  Symptoms are somewhat controlled with decreased breakthrough heartburn when she consistently takes the omeprazole and follows the antireflux measures.  Diarrhea is better when she takes the Prevalite but she forgets to take it on a consistent basis.  Denies any rectal bleeding, melena, abdominal pain, loss of appetite or weight loss  EGD 09/25/2018: Distal esophageal diverticula, hiatal hernia, slipped Nissen fundoplication, gastritis, biopsies negative for H. pylori, duodenal biopsies consistent with peptic duodenitis  Colonoscopy 09/25/2018: Decreased anal sphincter, left-sided diverticulosis, internal hemorrhoids.  Random colon biopsies  showed normal benign mucosa, negative for microscopic colitis  Negative for celiac, GI pathogen panel negative.  Normal fecal fat, decreased pancreatic elastase 21  Patient has had multiple EGDs with evidence of esophageal stricture in 2009, 2011,2013, 2015in 2016.On most recent EGD in 2016 she underwent savary dilation to 17 mm.Barium swallow in 2011 showed normal esophageal motility and evidence of distal esophageal stricture.  EGD and colonoscopy in May 2018 showed loose Nissen wrap, hiatal hernia and esophageal ulcer.  Random colon biopsies negative for microscopic colitis, inadequate prep.  Outpatient Encounter Medications as of 11/01/2018  Medication Sig  . cetirizine (ZYRTEC) 10 MG tablet Take 10 mg by mouth daily as needed for allergies.  . cholestyramine light (PREVALITE) 4 g packet 1/2 packet twice daily  . Melatonin 5 MG TABS Take 5 mg by mouth at bedtime.  Marland Kitchen olmesartan-hydrochlorothiazide (BENICAR HCT) 40-25 MG tablet Take 1 tablet by mouth daily.  Marland Kitchen omeprazole (PRILOSEC) 40 MG capsule Take 1 capsule (40 mg total) by mouth daily.  . sertraline (ZOLOFT) 100 MG tablet Take 1.5 tablets (150 mg total) by mouth daily.   No facility-administered encounter medications on file as of 11/01/2018.     Allergies as of 11/01/2018 - Review Complete 11/01/2018  Allergen Reaction Noted  . Adhesive [tape] Hives 07/03/2015  . Iodine Hives and Rash 11/26/2007  . Lisinopril Cough 11/02/2011  . Cephalexin Hives and Rash 11/26/2007  . Compazine [prochlorperazine edisylate]  11/26/2007  . Latex Itching 07/03/2015  . Oxycodone-acetaminophen Itching and Rash 07/17/2009    Past Medical History:  Diagnosis Date  . Anxiety   . Asthma    seasonal allergies, dry cough today   . 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 Cervical  cancer [facility])    "stage III; discovered S/P hysterectomy" (05/12/2017)  . Chronic bronchitis ([place])   . Depression   . Esophageal stricture   . Esophagitis   . Gastritis   . GERD  (gastroesophageal reflux disease)   . Hiatal hernia    history of, treated with surgery  . History of blood transfusion 1990s   "related to childbirth"  . History of kidney stones    passed stones spontaneously, also cystoscopy- done   . Hypertension   . Migraine    "monthly" (05/12/2017)  . Osteoarthritis of both knees   . Pancreatitis   . Pneumonia 2000s X1  . Pre-diabetes   . 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 Seasonal allergies   . Sleep apnea    Had nasal sx, and uses no cpap    Past Surgical History:  Procedure Laterality Date  . APPENDECTOMY    . CARDIAC CATHETERIZATION  1990's X 1  . CHOLECYSTECTOMY N/A 07/20/2017   Procedure: LAPAROSCOPIC CHOLECYSTECTOMY WITH INTRAOPERATIVE CHOLANGIOGRAM;  Surgeon: Excell Seltzer, MD;  Location: [facility];  Service: General;  Laterality: N/A;  . COLONOSCOPY    . CYSTOSCOPY W/ STONE MANIPULATION    . DILATION AND CURETTAGE OF UTERUS    . ENDOMETRIAL ABLATION    . ESOPHAGOGASTRODUODENOSCOPY (EGD) WITH ESOPHAGEAL DILATION  "several times"  . HERNIA REPAIR    . JOINT REPLACEMENT    . KIDNEY POLYPS REMOVED    . KNEE ARTHROSCOPY Bilateral   . LAPAROSCOPIC NISSEN FUNDOPLICATION  76/04/6071   Procedure: LAPAROSCOPIC NISSEN FUNDOPLICATION;  Surgeon: Pedro Earls, MD;  Location: [facility];  Service: General;  Laterality: N/A;  . NASAL SEPTUM SURGERY  2000s   "w/adenoids and hyoid bone removed"  . REVISION TOTAL KNEE ARTHROPLASTY Left 05/12/2017  . TONSILLECTOMY  2004   T&A- due to sleep apnea   . TOTAL KNEE ARTHROPLASTY Left 07/13/2015   Procedure: TOTAL KNEE ARTHROPLASTY;  Surgeon: Frederik Pear, MD;  Location: [facility];  Service: Orthopedics;  Laterality: Left;  . TOTAL KNEE REVISION Left 05/12/2017   Procedure: TOTAL KNEE REVISION;  Surgeon: Frederik Pear, MD;  Location: [facility];  Service: Orthopedics;  Laterality: Left;  . TUBAL LIGATION    . UPPER GASTROINTESTINAL ENDOSCOPY    . UPPER GI ENDOSCOPY  02/03/2012   Procedure: UPPER GI  ENDOSCOPY;  Surgeon: Pedro Earls, MD;  Location: [facility];  Service: General;;  . VAGINAL HYSTERECTOMY    . WISDOM TOOTH EXTRACTION      Family History  Problem Relation Age of Onset  . Heart disease Mother   . Heart disease Father   . Diabetes Father   . Colon cancer Maternal Grandmother   . Colon polyps Neg Hx   . Esophageal cancer Neg Hx   . Rectal cancer Neg Hx   . Stomach cancer Neg Hx     Social History   Socioeconomic History  . Marital status: Divorced    Spouse name: Not on file  . Number of children: 3  . Years of education: Not on file  . Highest education level: Not on file  Occupational History  . Occupation: LPN  Social Needs  . Financial resource strain: Not on file  . Food insecurity    Worry: Not on file    Inability: Not on file  . Transportation needs    Medical: Not on file    Non-medical: Not on file  Tobacco Use  . Smoking status: Never Smoker  .  Smokeless tobacco: Never Used  Substance and Sexual Activity  . Alcohol use: No    Frequency: Never  . Drug use: No  . Sexual activity: Not Currently  Lifestyle  . Physical activity    Days per week: Not on file    Minutes per session: Not on file  . Stress: Not on file  Relationships  . Social Herbalist on phone: Not on file    Gets together: Not on file    Attends religious service: Not on file    Active member of club or organization: Not on file    Attends meetings of clubs or organizations: Not on file    Relationship status: Not on file  . Intimate partner violence    Fear of current or ex partner: Not on file    Emotionally abused: Not on file    Physically abused: Not on file    Forced sexual activity: Not on file  Other Topics Concern  . Not on file  Social History Narrative  . Not on file      Review of systems: Review of Systems as per HPI All other systems reviewed and are negative.   Observations/Objective:   Data Reviewed:  Reviewed labs,  radiology imaging, old records and pertinent past GI work up   Assessment and Plan/Recommendations:  53 year old female with chronic GERD status post Nissen fundoplication 8184 with complaints of solid dysphagia and breakthrough heartburn EGD showed slipped Nissen with loose wrap and also has a small distal esophageal diverticula that is starting to form, diverticula was not apparent on previous EGDs Findings are concerning for possible E GJ outflow obstruction due to the slipped Nissen wrap  Continue omeprazole 40 mg daily and antireflux measures for GERD symptoms  We will schedule for esophageal manometry and 24-hour pH study without impedance of PPI for 1 week  Based on findings, will send referral to Dr. Johnathan Hausen at [place] for redo of Nissen  Diarrhea: Bile salt induced, improved with Prevalite.  Patient thinks she may be able to take it more consistently if it is a tablet instead of powder. Switched to Colestid 1 g tablet twice daily  Follow-up in 6 to 8 weeks     I discussed the assessment and treatment plan with the patient. The patient was provided an opportunity to ask questions and all were answered. The patient agreed with the plan and demonstrated an understanding of the instructions.   The patient was advised to call back or seek an in-person evaluation if the symptoms worsen or if the condition fails to improve as anticipated.     Harl Bowie, MD   CC: Meccariello, Bernita Raisin, *

## 2018-11-02 ENCOUNTER — Telehealth: Payer: Self-pay | Admitting: *Deleted

## 2018-11-02 ENCOUNTER — Telehealth: Payer: Self-pay | Admitting: Gastroenterology

## 2018-11-02 NOTE — Telephone Encounter (Signed)
Left message to patient that I sent her information in the mail about her procedure date and times and info for COVID testing

## 2018-11-02 NOTE — Addendum Note (Signed)
Addended by: Oda Kilts on: 11/02/2018 08:43 AM   Modules accepted: Orders, SmartSet

## 2018-11-02 NOTE — Telephone Encounter (Signed)
L/M for patient date and time and covid screening date and time  See other phone note  Mailed info  To patent today

## 2018-11-02 NOTE — Telephone Encounter (Signed)
Esopageal Mano scheduled on 11/19/2018 12:30pm [facility]

## 2018-11-02 NOTE — Telephone Encounter (Signed)
Pt called stating that she was returning your call. pls call her again.

## 2018-11-15 ENCOUNTER — Other Ambulatory Visit (HOSPITAL_COMMUNITY): Payer: 59

## 2018-11-15 ENCOUNTER — Telehealth: Payer: Self-pay | Admitting: Gastroenterology

## 2018-11-15 NOTE — Telephone Encounter (Signed)
Cancelling the esophageal mano with 24 hr impedance off PPI Hospital endo department notified.

## 2018-11-15 NOTE — Telephone Encounter (Signed)
ok 

## 2018-11-19 ENCOUNTER — Ambulatory Visit (HOSPITAL_COMMUNITY): Admit: 2018-11-19 | Payer: 59 | Admitting: Gastroenterology

## 2018-11-19 ENCOUNTER — Encounter (HOSPITAL_COMMUNITY): Payer: Self-pay

## 2018-11-19 SURGERY — MANOMETRY, ESOPHAGUS

## 2019-08-15 ENCOUNTER — Other Ambulatory Visit: Payer: Self-pay

## 2019-08-15 ENCOUNTER — Emergency Department (HOSPITAL_COMMUNITY): Payer: HRSA Program

## 2019-08-15 ENCOUNTER — Emergency Department (HOSPITAL_COMMUNITY)
Admission: EM | Admit: 2019-08-15 | Discharge: 2019-08-15 | Disposition: A | Discharge status: Home / Self Care | Payer: HRSA Program | Attending: Emergency Medicine | Admitting: Emergency Medicine

## 2019-08-15 DIAGNOSIS — E119 Type 2 diabetes mellitus without complications: Secondary | ICD-10-CM | POA: Diagnosis not present

## 2019-08-15 DIAGNOSIS — I10 Essential (primary) hypertension: Secondary | ICD-10-CM | POA: Insufficient documentation

## 2019-08-15 DIAGNOSIS — U071 COVID-19: Secondary | ICD-10-CM | POA: Diagnosis not present

## 2019-08-15 DIAGNOSIS — Z91040 Latex allergy status: Secondary | ICD-10-CM | POA: Insufficient documentation

## 2019-08-15 DIAGNOSIS — Z79899 Other long term (current) drug therapy: Secondary | ICD-10-CM | POA: Diagnosis not present

## 2019-08-15 DIAGNOSIS — Z96652 Presence of left artificial knee joint: Secondary | ICD-10-CM | POA: Insufficient documentation

## 2019-08-15 DIAGNOSIS — R509 Fever, unspecified: Secondary | ICD-10-CM | POA: Diagnosis present

## 2019-08-15 DIAGNOSIS — J45909 Unspecified asthma, uncomplicated: Secondary | ICD-10-CM | POA: Diagnosis not present

## 2019-08-15 LAB — CBC WITH DIFFERENTIAL/PLATELET
Abs Immature Granulocytes: 0 10*3/uL (ref 0.00–0.07)
Basophils Absolute: 0 10*3/uL (ref 0.0–0.1)
Basophils Relative: 0 %
Eosinophils Absolute: 0 10*3/uL (ref 0.0–0.5)
Eosinophils Relative: 0 %
HCT: 41.6 % (ref 36.0–46.0)
Hemoglobin: 12.9 g/dL (ref 12.0–15.0)
Immature Granulocytes: 0 %
Lymphocytes Relative: 47 %
Lymphs Abs: 1.3 10*3/uL (ref 0.7–4.0)
MCH: 26.6 pg (ref 26.0–34.0)
MCHC: 31 g/dL (ref 30.0–36.0)
MCV: 85.8 fL (ref 80.0–100.0)
Monocytes Absolute: 0.2 10*3/uL (ref 0.1–1.0)
Monocytes Relative: 9 %
Neutro Abs: 1.2 10*3/uL — ABNORMAL LOW (ref 1.7–7.7)
Neutrophils Relative %: 44 %
Platelets: 118 10*3/uL — ABNORMAL LOW (ref 150–400)
RBC: 4.85 MIL/uL (ref 3.87–5.11)
RDW: 12.4 % (ref 11.5–15.5)
WBC: 2.8 10*3/uL — ABNORMAL LOW (ref 4.0–10.5)
nRBC: 0 % (ref 0.0–0.2)

## 2019-08-15 LAB — BASIC METABOLIC PANEL
Anion gap: 9 (ref 5–15)
BUN: 6 mg/dL (ref 6–20)
CO2: 26 mmol/L (ref 22–32)
Calcium: 9.1 mg/dL (ref 8.9–10.3)
Chloride: 105 mmol/L (ref 98–111)
Creatinine, Ser: 0.84 mg/dL (ref 0.44–1.00)
GFR calc Af Amer: >60 mL/min (ref >60)
GFR calc non Af Amer: >60 mL/min (ref >60)
Glucose, Bld: 119 mg/dL — ABNORMAL HIGH (ref 70–99)
Potassium: 3.9 mmol/L (ref 3.5–5.1)
Sodium: 140 mmol/L (ref 135–145)

## 2019-08-15 LAB — TROPONIN I (HIGH SENSITIVITY): Troponin I (High Sensitivity): 4 ng/L (ref <18)

## 2019-08-15 MED ORDER — ALBUTEROL SULFATE HFA 108 (90 BASE) MCG/ACT IN AERS
2.0000 | INHALATION_SPRAY | Freq: Once | RESPIRATORY_TRACT | Status: AC
  Administered 2019-08-15: 2 via RESPIRATORY_TRACT
  Filled 2019-08-15: qty 6.7

## 2019-08-15 MED ORDER — ACETAMINOPHEN 325 MG PO TABS
650.0000 mg | ORAL_TABLET | Freq: Once | ORAL | Status: AC
  Administered 2019-08-15: 650 mg via ORAL
  Filled 2019-08-15: qty 2

## 2019-08-15 MED ORDER — BENZONATATE 100 MG PO CAPS: 100.0000 mg | ORAL_CAPSULE | Freq: Three times a day (TID) | ORAL | 0 refills | Status: AC

## 2019-08-15 NOTE — ED Notes (Signed)
Pt discharge instructions reviewed with the patient. The patient verbalized understanding of instructions. Pt discharged. 

## 2019-08-15 NOTE — ED Notes (Signed)
Pt o2 sat 100% @ bedside. Pt o2 sat remained 100% during ambulation.

## 2019-08-15 NOTE — Discharge Instructions (Addendum)
Continue to take Tylenol every 6 hours for fevers.  Make sure you are staying well-hydrated at home.  You can take 2 puffs of the albuterol inhaler every 4-6 hours to help with shortness of breath or cough.  I also gave you a medication for cough.  Please use as directed.  Please make a follow-up appoint with your doctor within the next 5 to 7 days for reassessment.  Return to the emergency department for any new or worsening symptoms in the meantime.  You should be isolated for at least 7 days since the onset of your symptoms AND >72 hours after symptoms resolution (absence of fever without the use of fever reducing medicaiton and improvement in respiratory symptoms), whichever is longer

## 2019-08-15 NOTE — ED Provider Notes (Signed)
[facility]ERGENCY DEPARTMENT Provider Note   CSN: [id number] Arrival date & time: 08/15/19  1212     History Chief Complaint  Patient presents with  . Fever    Leslie Martinez is a 54 y.o. female.  HPI   Pt is a 54 y/o female with a h/o anxiety, asthma, Barrett's esophagus, cervical cancer, depression, esophageal stricture, esophagitis, gastritis, GERD, L1 hernia, nephrolithiasis, hypertension, migraines, pancreatitis, pneumonia, Prinzmetal's angina, sleep apnea, seasonal allergies, who presents to the ED today for eval of fever. States she was diagnosed with covid 1 week ago. Sxs started about 1.5 weeks ago and started having rhinorrhea, congestion, productive cough, fevers, body aches. States she started to become concerned because she has been taking tylenol every 6 hours but has continued to have fevers which concerned her. She states she cannot NSAIDs because her GI provider told her not to. She c/o some mild shortness of breath.  States that her chest "feels funny". States she has some chest pain that feels like a cold. State she has had constant discomfort since yesterday.   Past Medical History:  Diagnosis Date  . Anxiety   . Asthma    seasonal allergies, dry cough today   . 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 Cervical cancer ([place])    "stage III; discovered S/P hysterectomy" (05/12/2017)  . Chronic bronchitis ([place])   . Depression   . Esophageal stricture   . Esophagitis   . Gastritis   . GERD (gastroesophageal reflux disease)   . Hiatal hernia    history of, treated with surgery  . History of blood transfusion 1990s   "related to childbirth"  . History of kidney stones    passed stones spontaneously, also cystoscopy- done   . Hypertension   . Migraine    "monthly" (05/12/2017)  . Osteoarthritis of both knees   . Pancreatitis   . Pneumonia 2000s X1  . Pre-diabetes   . 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 Seasonal allergies   . Sleep apnea      Had nasal sx, and uses no cpap    Patient Active Problem List   Diagnosis Date Noted  . Adjustment disorder with mixed anxiety and depressed mood 06/08/2018  . Diarrhea 06/08/2018  . Encounter for screening for malignant neoplasm of breast 06/08/2018  . Allergic conjunctivitis of left eye 04/16/2018  . Cholecystitis with cholelithiasis 07/20/2017  . Acute cholecystitis   . 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 History of Prinzmetal angina   . Dysphagia   . Type 2 diabetes mellitus without complication ([place]) [id number]  . 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 Depression 07/23/2015  . Generalized anxiety disorder 07/23/2015  . Primary osteoarthritis of knee 07/13/2015  . Primary osteoarthritis of left knee 07/11/2015  . 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 Lap Nissen Nov 2013 02/06/2012  . Essential hypertension 01/09/2008  . 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 Asthma, chronic 01/09/2008  . BARRETT'S ESOPHAGUS, HX OF 01/09/2008  . NEPHROLITHIASIS, HX OF 01/09/2008  . DYSPHAGIA UNSPECIFIED 11/26/2007    Past Surgical History:  Procedure Laterality Date  . APPENDECTOMY    . CARDIAC CATHETERIZATION  1990's X 1  . CHOLECYSTECTOMY N/A 07/20/2017   Procedure: LAPAROSCOPIC CHOLECYSTECTOMY WITH INTRAOPERATIVE CHOLANGIOGRAM;  Surgeon: Excell Seltzer, MD;  Location: [facility];  Service: General;  Laterality: N/A;  . COLONOSCOPY    . CYSTOSCOPY W/ STONE MANIPULATION    . DILATION AND CURETTAGE OF UTERUS    .  ENDOMETRIAL ABLATION    . ESOPHAGOGASTRODUODENOSCOPY (EGD) WITH ESOPHAGEAL DILATION  "several times"  . HERNIA REPAIR    . JOINT REPLACEMENT    . KIDNEY POLYPS REMOVED    . KNEE ARTHROSCOPY Bilateral   . LAPAROSCOPIC NISSEN FUNDOPLICATION  [id number]   Procedure: LAPAROSCOPIC NISSEN FUNDOPLICATION;  Surgeon: Pedro Earls, MD;  Location: [facility];  Service: General;  Laterality: N/A;  . NASAL SEPTUM SURGERY  2000s   "w/adenoids and hyoid bone  removed"  . REVISION TOTAL KNEE ARTHROPLASTY Left 05/12/2017  . TONSILLECTOMY  2004   T&A- due to sleep apnea   . TOTAL KNEE ARTHROPLASTY Left 07/13/2015   Procedure: TOTAL KNEE ARTHROPLASTY;  Surgeon: Frederik Pear, MD;  Location: [facility];  Service: Orthopedics;  Laterality: Left;  . TOTAL KNEE REVISION Left 05/12/2017   Procedure: TOTAL KNEE REVISION;  Surgeon: Frederik Pear, MD;  Location: [facility];  Service: Orthopedics;  Laterality: Left;  . TUBAL LIGATION    . UPPER GASTROINTESTINAL ENDOSCOPY    . UPPER GI ENDOSCOPY  02/03/2012   Procedure: UPPER GI ENDOSCOPY;  Surgeon: Pedro Earls, MD;  Location: [facility];  Service: General;;  . VAGINAL HYSTERECTOMY    . WISDOM TOOTH EXTRACTION       OB History   No obstetric history on file.     Family History  Problem Relation Age of Onset  . Heart disease Mother   . Heart disease Father   . Diabetes Father   . Colon cancer Maternal Grandmother   . Colon polyps Neg Hx   . Esophageal cancer Neg Hx   . Rectal cancer Neg Hx   . Stomach cancer Neg Hx     Social History   Tobacco Use  . Smoking status: Never Smoker  . Smokeless tobacco: Never Used  Substance Use Topics  . Alcohol use: No  . Drug use: No    Home Medications Prior to Admission medications   Medication Sig Start Date End Date Taking? Authorizing Provider  benzonatate (TESSALON) 100 MG capsule Take 1 capsule (100 mg total) by mouth every 8 (eight) hours for 5 days. 08/15/19 08/20/19  Lessa Huge S, PA-C  cetirizine (ZYRTEC) 10 MG tablet Take 10 mg by mouth daily as needed for allergies.    [provider]  cholestyramine light (PREVALITE) 4 g packet 1/2 packet twice daily 09/19/18   Mauri Pole, MD  colestipol (COLESTID) 1 g tablet Take 1 tablet (1 g total) by mouth 2 (two) times daily. 11/01/18   Mauri Pole, MD  Melatonin 5 MG TABS Take 5 mg by mouth at bedtime.    [provider]  olmesartan-hydrochlorothiazide (BENICAR HCT) 40-25 MG  tablet Take 1 tablet by mouth daily. 04/16/18   Steve Rattler, DO  omeprazole (PRILOSEC) 40 MG capsule Take 1 capsule (40 mg total) by mouth daily. 08/15/16   Mauri Pole, MD  sertraline (ZOLOFT) 100 MG tablet Take 1.5 tablets (150 mg total) by mouth daily. 06/08/18   Meccariello, Bernita Raisin, DO    Allergies    Adhesive [tape], Iodine, Lisinopril, Cephalexin, Compazine [prochlorperazine edisylate], Latex, and Oxycodone-acetaminophen  Review of Systems   Review of Systems  Constitutional: Positive for fever.  HENT: Positive for congestion, rhinorrhea and sore throat. Negative for ear pain.   Eyes: Negative for visual disturbance.  Respiratory: Positive for cough and shortness of breath.   Cardiovascular: Positive for chest pain.  Gastrointestinal: Negative for abdominal pain, constipation, diarrhea, nausea and vomiting.  Genitourinary: Negative for dysuria and hematuria.  Musculoskeletal: Negative for back pain.  Skin: Negative for rash.  Neurological: Negative for headaches.  All other systems reviewed and are negative.   Physical Exam Updated Vital Signs BP 134/75   Pulse 76   Temp (!) 100.7 F (38.2 C) (Oral)   Resp 18   Ht 5\' 9"  (1.753 m)   Wt 115.2 kg   SpO2 97%   BMI 37.51 kg/m   Physical Exam Vitals and nursing note reviewed.  Constitutional:      General: She is not in acute distress.    Appearance: She is well-developed.  HENT:     Head: Normocephalic and atraumatic.  Eyes:     Conjunctiva/sclera: Conjunctivae normal.  Cardiovascular:     Rate and Rhythm: Normal rate and regular rhythm.     Heart sounds: Normal heart sounds. No murmur.  Pulmonary:     Effort: Pulmonary effort is normal. No respiratory distress.     Breath sounds: Normal breath sounds. No wheezing, rhonchi or rales.  Abdominal:     General: Bowel sounds are normal.     Palpations: Abdomen is soft.     Tenderness: There is no abdominal tenderness. There is no guarding or rebound.    Musculoskeletal:     Cervical back: Neck supple.  Skin:    General: Skin is warm and dry.  Neurological:     Mental Status: She is alert.     ED Results / Procedures / Treatments   Labs (all labs ordered are listed, but only abnormal results are displayed) Labs Reviewed  CBC WITH DIFFERENTIAL/PLATELET - Abnormal; Notable for the following components:      Result Value   WBC 2.8 (*)    Platelets 118 (*)    Neutro Abs 1.2 (*)    All other components within normal limits  BASIC METABOLIC PANEL - Abnormal; Notable for the following components:   Glucose, Bld 119 (*)    All other components within normal limits  TROPONIN I (HIGH SENSITIVITY)  TROPONIN I (HIGH SENSITIVITY)    EKG EKG Interpretation  Date/Time:  Thursday Aug 15 2019 14:46:00 EDT Ventricular Rate:  81 PR Interval:    QRS Duration: 95 QT Interval:  375 QTC Calculation: 436 R Axis:   86 Text Interpretation: Sinus rhythm Consider left atrial enlargement Borderline repolarization abnormality Baseline wander in lead(s) V5 Interpretation limited secondary to artifact Confirmed by Fredia Sorrow [phone]) on 08/15/2019 2:52:42 PM   Radiology DG Chest Portable 1 View  Result Date: 08/15/2019 CLINICAL DATA:  Shortness of breath, COVID-19 positive EXAM: PORTABLE CHEST 1 VIEW COMPARISON:  Radiograph 05/09/2017 FINDINGS: Some streaky and patchy opacities in the lung bases may reflect early consolidation in the setting of COVID-19. No pleural effusion, pneumothorax or convincing features of edema. Cardiomediastinal contours are similar to prior radiograph. No acute osseous or soft tissue abnormality. IMPRESSION: Some streaky and patchy opacities in the lung bases may reflect early consolidation/infection in the setting of COVID-19. Electronically Signed   By: Lovena Le M.D.   On: 08/15/2019 14:59    Procedures Procedures (including critical care time)  Medications Ordered in ED Medications  albuterol (VENTOLIN HFA)  108 (90 Base) MCG/ACT inhaler 2 puff (2 puffs Inhalation Given 08/15/19 1412)  acetaminophen (TYLENOL) tablet 650 mg (650 mg Oral Given 08/15/19 1411)    ED Course  I have reviewed the triage vital signs and the nursing notes.  Pertinent labs & imaging results that were available during  my care of the patient were reviewed by me and considered in my medical decision making (see chart for details).    MDM Rules/Calculators/A&P                      Pt is a 54 y/o female with a h/o anxiety, asthma, Barrett's esophagus, cervical cancer, depression, esophageal stricture, esophagitis, gastritis, GERD, L1 hernia, nephrolithiasis, hypertension, migraines, pancreatitis, pneumonia, Prinzmetal's angina, sleep apnea, seasonal allergies, who presents to the ED today for eval of fever. States she was diagnosed with covid 1 week ago. Sxs started about 1.5 weeks ago and started having rhinorrhea, congestion, productive cough, fevers, body aches. States she started to become concerned because she has been taking tylenol every 6 hours but has continued to have fevers which concerned her. She states she cannot NSAIDs because her GI provider told her not to. She c/o some mild shortness of breath.  States that her chest "feels funny". States she has some chest pain that feels like a cold. State she has had constant discomfort since yesterday.   Reviewed/interpreted labs CBC without anemia, leukopenia noted likely secondary to Covid BNP was normal electrolytes and kidney Trend troponin negative, pain has been constant for 24 hours therefore delta troponin felt to be unnecessary.  Symptoms seem atypical for for ACS and are likely related to her current Covid infection.  ekg Sinus rhythm Consider left atrial enlargement Borderline repolarization abnormality Baseline wander in lead(s) V5 Interpretation limited secondary to artifact  Chest x-ray shows Some streaky and patchy opacities in the lung bases may reflect early  consolidation/infection in the setting of COVID-19.   Patient was given albuterol and Tylenol in the ED.  On reassessment she states that her breathing feels somewhat improved.  She was ambulated in the ED and sats remained at 100% on room air.  Work-up is reassuring thus far and I do not feel the.  Right requires admission at this time.  Advised her to continue supportive therapy with Tylenol for fevers, oral hydration.  Also gave albuterol inhaler and cough medication for Covid.  Advised on close follow-up with PCP and advised on strict return precautions.  She voices understanding of the plan and reasons to return.  All questions answered.  Patient stable for discharge.  Final Clinical Impression(s) / ED Diagnoses Final diagnoses:  [id number]    Rx / DC Orders ED Discharge Orders         Ordered    benzonatate (TESSALON) 100 MG capsule  Every 8 hours     08/15/19 1603           Prophet Renwick S, PA-C 08/15/19 1614    Fredia Sorrow, MD 08/17/19 [phone]

## 2019-08-15 NOTE — ED Triage Notes (Signed)
Stated tested + for Covid last Thursday; c/o fever, shortness of breath and chest tightness.

## 2019-08-16 ENCOUNTER — Telehealth: Payer: Self-pay | Admitting: Unknown Physician Specialty

## 2019-08-16 ENCOUNTER — Other Ambulatory Visit: Payer: Self-pay | Admitting: Unknown Physician Specialty

## 2019-08-16 ENCOUNTER — Telehealth: Payer: Self-pay

## 2019-08-16 DIAGNOSIS — U071 COVID-19: Secondary | ICD-10-CM

## 2019-08-16 DIAGNOSIS — E119 Type 2 diabetes mellitus without complications: Secondary | ICD-10-CM

## 2019-08-16 DIAGNOSIS — E669 Obesity, unspecified: Secondary | ICD-10-CM

## 2019-08-16 NOTE — Telephone Encounter (Signed)
  I connected by phone with Leslie Martinez on 08/16/2019 at 4:55 PM to discuss the potential use of an new treatment for mild to moderate COVID-19 viral infection in non-hospitalized patients.  This patient is a 54 y.o. female that meets the FDA criteria for Emergency Use Authorization of bamlanivimab/etesevimab or casirivimab/imdevimab.  Has a (+) direct SARS-CoV-2 viral test result  Has mild or moderate COVID-19   Is ? 54 years of age and weighs ? 40 kg  Is NOT hospitalized due to COVID-19  Is NOT requiring oxygen therapy or requiring an increase in baseline oxygen flow rate due to COVID-19  Is within 10 days of symptom onset  Has at least one of the high risk factor(s) for progression to severe COVID-19 and/or hospitalization as defined in EUA.  Specific high risk criteria : Diabetes   I have spoken and communicated the following to the patient or parent/caregiver:  1. FDA has authorized the emergency use of bamlanivimab/etesevimab and casirivimab\imdevimab for the treatment of mild to moderate COVID-19 in adults and pediatric patients with positive results of direct SARS-CoV-2 viral testing who are 110 years of age and older weighing at least 40 kg, and who are at high risk for progressing to severe COVID-19 and/or hospitalization.  2. The significant known and potential risks and benefits of bamlanivimab/etesevimab and casirivimab\imdevimab, and the extent to which such potential risks and benefits are unknown.  3. Information on available alternative treatments and the risks and benefits of those alternatives, including clinical trials.  4. Patients treated with bamlanivimab/etesevimab and casirivimab\imdevimab should continue to self-isolate and use infection control measures (e.g., wear mask, isolate, social distance, avoid sharing personal items, clean and disinfect "high touch" surfaces, and frequent handwashing) according to CDC guidelines.   5. The patient or parent/caregiver  has the option to accept or refuse bamlanivimab/etesevimab or casirivimab\imdevimab .  After reviewing this information with the patient, The patient agreed to proceed with receiving the bamlanimivab infusion and will be provided a copy of the Fact sheet prior to receiving the infusion.Leslie Martinez 08/16/2019 4:55 PM  Symptom onset 5/12

## 2019-08-16 NOTE — Telephone Encounter (Signed)
Meets criteria for infusion.  Called infusion center and left VM with patient's information.

## 2019-08-16 NOTE — Telephone Encounter (Signed)
Patient calls nurse line due to COVID symptoms. Patient reports being seen in ED last night and being diagnosed with COVID pneumonia. Patient reports continued cough and fever up to 101.2. Advised continued supportive therapies.  Spoke with Eniola regarding patient. Eniola advises ATC on Monday, however due to scheduling limitations, patient scheduled for Tuesday 08/20/19 with Dr. Lemmie Evens. Anderson. Forwarding to PCP, per Eniola, to see if patient would be a candidate for COVID infusion therapy.   Strict ED precautions given.

## 2019-08-17 MED ORDER — SODIUM CHLORIDE 0.9 % IV SOLN
Freq: Once | INTRAVENOUS | Status: AC
  Filled 2019-08-17: qty 700

## 2019-08-18 ENCOUNTER — Ambulatory Visit (HOSPITAL_COMMUNITY)
Admission: RE | Admit: 2019-08-18 | Discharge: 2019-08-18 | Disposition: A | Discharge status: Home / Self Care | Payer: HRSA Program | Source: Ambulatory Visit | Attending: Pulmonary Disease | Admitting: Pulmonary Disease

## 2019-08-18 DIAGNOSIS — I10 Essential (primary) hypertension: Secondary | ICD-10-CM | POA: Diagnosis present

## 2019-08-18 DIAGNOSIS — U071 COVID-19: Secondary | ICD-10-CM | POA: Insufficient documentation

## 2019-08-18 DIAGNOSIS — E669 Obesity, unspecified: Secondary | ICD-10-CM | POA: Diagnosis present

## 2019-08-18 DIAGNOSIS — E1169 Type 2 diabetes mellitus with other specified complication: Secondary | ICD-10-CM | POA: Insufficient documentation

## 2019-08-18 DIAGNOSIS — E119 Type 2 diabetes mellitus without complications: Secondary | ICD-10-CM | POA: Diagnosis present

## 2019-08-18 DIAGNOSIS — E1159 Type 2 diabetes mellitus with other circulatory complications: Secondary | ICD-10-CM | POA: Diagnosis present

## 2019-08-18 MED ORDER — EPINEPHRINE 0.3 MG/0.3ML IJ SOAJ: 0.3000 mg | Freq: Once | INTRAMUSCULAR | Status: DC | PRN

## 2019-08-18 MED ORDER — METHYLPREDNISOLONE SODIUM SUCC 125 MG IJ SOLR: 125.0000 mg | Freq: Once | INTRAMUSCULAR | Status: DC | PRN

## 2019-08-18 MED ORDER — FAMOTIDINE IN NACL 20-0.9 MG/50ML-% IV SOLN: 20.0000 mg | Freq: Once | INTRAVENOUS | Status: DC | PRN

## 2019-08-18 MED ORDER — DIPHENHYDRAMINE HCL 50 MG/ML IJ SOLN: 50.0000 mg | Freq: Once | INTRAMUSCULAR | Status: DC | PRN

## 2019-08-18 MED ORDER — SODIUM CHLORIDE 0.9 % IV SOLN: INTRAVENOUS | Status: DC | PRN

## 2019-08-18 MED ORDER — ALBUTEROL SULFATE HFA 108 (90 BASE) MCG/ACT IN AERS: 2.0000 | INHALATION_SPRAY | Freq: Once | RESPIRATORY_TRACT | Status: DC | PRN

## 2019-08-18 NOTE — Discharge Instructions (Signed)

## 2019-08-18 NOTE — Progress Notes (Signed)
  Diagnosis: COVID-19  Physician:  Procedure: Covid Infusion Clinic Med: bamlanivimab\etesevimab infusion - Provided patient with bamlanimivab\etesevimab fact sheet for patients, parents and caregivers prior to infusion.  Complications: No immediate complications noted.  Discharge: Discharged home   Virgilio Belling 08/18/2019

## 2019-08-19 ENCOUNTER — Other Ambulatory Visit: Payer: Self-pay

## 2019-08-19 ENCOUNTER — Emergency Department (HOSPITAL_COMMUNITY)
Admission: EM | Admit: 2019-08-19 | Discharge: 2019-08-20 | Disposition: A | Discharge status: Home / Self Care | Payer: HRSA Program | Attending: Emergency Medicine | Admitting: Emergency Medicine

## 2019-08-19 ENCOUNTER — Encounter (HOSPITAL_COMMUNITY): Payer: Self-pay | Admitting: Emergency Medicine

## 2019-08-19 DIAGNOSIS — J1282 Pneumonia due to coronavirus disease 2019: Secondary | ICD-10-CM

## 2019-08-19 DIAGNOSIS — I10 Essential (primary) hypertension: Secondary | ICD-10-CM | POA: Insufficient documentation

## 2019-08-19 DIAGNOSIS — R0602 Shortness of breath: Secondary | ICD-10-CM | POA: Diagnosis present

## 2019-08-19 DIAGNOSIS — Z1282 Encounter for screening for malignant neoplasm of nervous system: Secondary | ICD-10-CM | POA: Insufficient documentation

## 2019-08-19 DIAGNOSIS — U071 COVID-19: Secondary | ICD-10-CM | POA: Diagnosis not present

## 2019-08-19 LAB — COMPREHENSIVE METABOLIC PANEL
ALT: 24 U/L (ref 0–44)
AST: 34 U/L (ref 15–41)
Albumin: 3.7 g/dL (ref 3.5–5.0)
Alkaline Phosphatase: 78 U/L (ref 38–126)
Anion gap: 11 (ref 5–15)
BUN: 6 mg/dL (ref 6–20)
CO2: 25 mmol/L (ref 22–32)
Calcium: 9.4 mg/dL (ref 8.9–10.3)
Chloride: 101 mmol/L (ref 98–111)
Creatinine, Ser: 0.79 mg/dL (ref 0.44–1.00)
GFR calc Af Amer: >60 mL/min (ref >60)
GFR calc non Af Amer: >60 mL/min (ref >60)
Glucose, Bld: 112 mg/dL — ABNORMAL HIGH (ref 70–99)
Potassium: 3.8 mmol/L (ref 3.5–5.1)
Sodium: 137 mmol/L (ref 135–145)
Total Bilirubin: 0.6 mg/dL (ref 0.3–1.2)
Total Protein: 7.9 g/dL (ref 6.5–8.1)

## 2019-08-19 LAB — CBC
HCT: 40.9 % (ref 36.0–46.0)
Hemoglobin: 12.7 g/dL (ref 12.0–15.0)
MCH: 26.3 pg (ref 26.0–34.0)
MCHC: 31.1 g/dL (ref 30.0–36.0)
MCV: 84.9 fL (ref 80.0–100.0)
Platelets: 157 10*3/uL (ref 150–400)
RBC: 4.82 MIL/uL (ref 3.87–5.11)
RDW: 12.4 % (ref 11.5–15.5)
WBC: 4.2 10*3/uL (ref 4.0–10.5)
nRBC: 0 % (ref 0.0–0.2)

## 2019-08-19 LAB — URINALYSIS, ROUTINE W REFLEX MICROSCOPIC
Bacteria, UA: NONE SEEN
Bilirubin Urine: NEGATIVE
Glucose, UA: NEGATIVE mg/dL
Ketones, ur: NEGATIVE mg/dL
Leukocytes,Ua: NEGATIVE
Nitrite: NEGATIVE
Protein, ur: NEGATIVE mg/dL
Specific Gravity, Urine: 1.015 (ref 1.005–1.030)
pH: 6 (ref 5.0–8.0)

## 2019-08-19 MED ORDER — ACETAMINOPHEN 325 MG PO TABS
650.0000 mg | ORAL_TABLET | Freq: Once | ORAL | Status: AC
  Administered 2019-08-19: 650 mg via ORAL
  Filled 2019-08-19: qty 2

## 2019-08-19 MED ORDER — ONDANSETRON 4 MG PO TBDP
4.0000 mg | ORAL_TABLET | Freq: Once | ORAL | Status: AC | PRN
  Administered 2019-08-19: 4 mg via ORAL
  Filled 2019-08-19: qty 1

## 2019-08-19 MED ORDER — ALBUTEROL SULFATE (2.5 MG/3ML) 0.083% IN NEBU: 5.0000 mg | INHALATION_SOLUTION | Freq: Once | RESPIRATORY_TRACT | Status: DC

## 2019-08-19 MED ORDER — ALBUTEROL SULFATE HFA 108 (90 BASE) MCG/ACT IN AERS: 8.0000 | INHALATION_SPRAY | Freq: Once | RESPIRATORY_TRACT | Status: DC

## 2019-08-19 NOTE — ED Triage Notes (Signed)
Pt tested positive for Covid last Thursday, has has an infusion at the infusion center, today became very SOB, experienced chest tightness and nauseas.  Vitals are stable other than a fever of 100.  Was seen at ED on the 13th for same.

## 2019-08-20 ENCOUNTER — Emergency Department (HOSPITAL_COMMUNITY): Payer: HRSA Program

## 2019-08-20 ENCOUNTER — Telehealth: Payer: Self-pay

## 2019-08-20 ENCOUNTER — Telehealth: Payer: Self-pay | Admitting: *Deleted

## 2019-08-20 ENCOUNTER — Telehealth (INDEPENDENT_AMBULATORY_CARE_PROVIDER_SITE_OTHER): Payer: Self-pay | Admitting: Family Medicine

## 2019-08-20 DIAGNOSIS — J1282 Pneumonia due to coronavirus disease 2019: Secondary | ICD-10-CM | POA: Insufficient documentation

## 2019-08-20 DIAGNOSIS — U071 COVID-19: Secondary | ICD-10-CM

## 2019-08-20 MED ORDER — ALBUTEROL SULFATE HFA 108 (90 BASE) MCG/ACT IN AERS: 8.0000 | INHALATION_SPRAY | Freq: Once | RESPIRATORY_TRACT | 0 refills | Status: DC

## 2019-08-20 MED ORDER — ONDANSETRON 4 MG PO TBDP
4.0000 mg | ORAL_TABLET | Freq: Once | ORAL | Status: DC
  Filled 2019-08-20: qty 1

## 2019-08-20 MED ORDER — DEXAMETHASONE 4 MG PO TABS
6.0000 mg | ORAL_TABLET | Freq: Once | ORAL | Status: AC
  Administered 2019-08-20: 6 mg via ORAL
  Filled 2019-08-20: qty 2

## 2019-08-20 MED ORDER — ONDANSETRON 4 MG PO TBDP: 4.0000 mg | ORAL_TABLET | Freq: Once | ORAL | 0 refills | Status: AC

## 2019-08-20 MED ORDER — ALBUTEROL SULFATE HFA 108 (90 BASE) MCG/ACT IN AERS
8.0000 | INHALATION_SPRAY | Freq: Once | RESPIRATORY_TRACT | Status: DC
  Filled 2019-08-20: qty 6.7

## 2019-08-20 MED ORDER — GUAIFENESIN 100 MG/5ML PO SOLN
5.0000 mL | Freq: Once | ORAL | Status: AC
  Administered 2019-08-20: 100 mg via ORAL
  Filled 2019-08-20: qty 5

## 2019-08-20 MED ORDER — AEROCHAMBER PLUS FLO-VU LARGE MISC: 1.0000 | Freq: Once | Status: DC

## 2019-08-20 MED ORDER — LORAZEPAM 1 MG PO TABS: 1.0000 mg | ORAL_TABLET | Freq: Every day | ORAL | 0 refills | Status: AC

## 2019-08-20 NOTE — ED Notes (Signed)
Zofran and Albuterol inhaler given by night shift RN.

## 2019-08-20 NOTE — Telephone Encounter (Signed)
Patient calls nurse line requesting CXR results and further instructions regarding results.   To PCP and Dr. Valentina Lucks, RN

## 2019-08-20 NOTE — ED Notes (Addendum)
Pt ambulated with oximetry  Pt oxygen went from 98 to 94

## 2019-08-20 NOTE — H&P (Deleted)
[facility] Admission History and Physical Service Pager: [phone]  Patient name: Leslie Martinez Medical record number: [id number] Date of birth: 04-25-1965 Age: 54 y.o. Gender: female  Primary Care Provider: Cleophas Dunker, DO  Chief Complaint: Worsening S OB and generalized muscle aches  Assessment and Plan: [name] is a 54 y.o. female presenting with worsened shortness of breath and muscle aches. PMH is significant for obesity and diabetes  COVID-19 infection [name] presented to the ED today for subjectively worsened symptoms of COVID-19.  She showed no evidence of hypoxia and passed an ambulatory screen with saturations between 94 and 98%.  She is not febrile today in the emergency department and vitals are generally within normal limits.  Initial blood work is unremarkable for significant signs of dehydration or infection.  We discussed that [name] does not meet any admission criteria for her condition.  She was encouraged to consider outpatient treatment where she would receive the same medications without the requirement of monitoring in the inpatient setting.  She expressed anxiety about returning home because she lives alone with no one to keep an eye on her.  Following further discussion with Dr. Milta Deiters and Ms. Mouse, it was decided that [name] would return home to continue her convalescence.  We will plan to monitor her closely via telemedicine appointments from the clinic and prescribed appropriate medications that she can remain comfortable.  She was discharged from the emergency room. -Follow-up via telemedicine tomorrow with Dr. Shan Levans -Zofran as needed for nausea -Albuterol as needed for wheezing/S OB -Tylenol as needed for fever -She was instructed to stop taking her antihypertensive medication until she begins to feel better taking better p.o.   Disposition: Discharged from emergency department  History of Present Illness:   [name] is a 54 y.o. female presenting with subjective worsening of COVID-19 symptoms.  She has a previous medical history significant for obesity and diabetes.  [name] was initially diagnosed with a COVID-19 infection on 5/6.  Since that time, she has been isolating at home without significant issue.  She experienced some worsening symptoms of muscle aches and moderate shortness of breath this past Thursday and spoke with her primary care physician.  She was set up with a monoclonal antibody infusion which she received on 5/16.  She was tolerating her symptoms well until yesterday at around 4:00 when she began to notice worsened muscle aches around her shoulders and mid back and worsened shortness of breath with exertion.  She also noted a chest achiness with inhalation.  She denied left arm pain, jaw pain.  With these worsening symptoms, she grew increasingly distressed and she was concerned she would do very poorly at home and there would be no one to take care of her or monitor her as she lives alone.  She presented to the ED and was found to be oxygenating well with no significant abnormalities to her vitals.  Initial labs were entirely unremarkable.  Chest x-ray showed mild progression of her COVID-19 pneumonia.  She was given dexamethasone, albuterol and Zofran in the ED.  She noted mild improvement of her symptoms with albuterol.    Review Of Systems: Per HPI with the following additions:   Review of Systems  Constitutional: Positive for malaise/fatigue. Negative for chills and fever (in the last 3 days).  HENT: Negative for hearing loss and sore throat.   Eyes: Negative for blurred vision.  Respiratory: Positive for cough, sputum production  and shortness of breath. Negative for hemoptysis and wheezing.   Cardiovascular: Positive for chest pain. Negative for palpitations and leg swelling.  Gastrointestinal: Positive for diarrhea (mild, none today) and nausea. Negative for abdominal  pain, constipation, heartburn and vomiting.  Genitourinary: Negative for dysuria.  Musculoskeletal: Positive for back pain and myalgias.  Skin: Negative for rash.  Neurological: Negative for dizziness, tremors and headaches.    Patient Active Problem List   Diagnosis Date Noted  . Adjustment disorder with mixed anxiety and depressed mood 06/08/2018  . Diarrhea 06/08/2018  . Encounter for screening for malignant neoplasm of breast 06/08/2018  . Allergic conjunctivitis of left eye 04/16/2018  . Cholecystitis with cholelithiasis 07/20/2017  . Acute cholecystitis   . Obesity, diabetes, and hypertension syndrome ([place])   . History of Prinzmetal angina   . Dysphagia   . Type 2 diabetes mellitus without complication ([place]) [id number]  . 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 Depression 07/23/2015  . Generalized anxiety disorder 07/23/2015  . Primary osteoarthritis of knee 07/13/2015  . Primary osteoarthritis of left knee 07/11/2015  . 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 Lap Nissen Nov 2013 02/06/2012  . Essential hypertension 01/09/2008  . 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 Asthma, chronic 01/09/2008  . BARRETT'S ESOPHAGUS, HX OF 01/09/2008  . NEPHROLITHIASIS, HX OF 01/09/2008  . DYSPHAGIA UNSPECIFIED 11/26/2007    Past Medical History: Past Medical History:  Diagnosis Date  . Anxiety   . Asthma    seasonal allergies, dry cough today   . 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 Cervical cancer ([place])    "stage III; discovered S/P hysterectomy" (05/12/2017)  . Chronic bronchitis ([place])   . Depression   . Esophageal stricture   . Esophagitis   . Gastritis   . GERD (gastroesophageal reflux disease)   . Hiatal hernia    history of, treated with surgery  . History of blood transfusion 1990s   "related to childbirth"  . History of kidney stones    passed stones spontaneously, also cystoscopy- done   . Hypertension   . Migraine    "monthly" (05/12/2017)  . Osteoarthritis of both  knees   . Pancreatitis   . Pneumonia 2000s X1  . Pre-diabetes   . 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 Seasonal allergies   . Sleep apnea    Had nasal sx, and uses no cpap    Past Surgical History: Past Surgical History:  Procedure Laterality Date  . APPENDECTOMY    . CARDIAC CATHETERIZATION  1990's X 1  . CHOLECYSTECTOMY N/A 07/20/2017   Procedure: LAPAROSCOPIC CHOLECYSTECTOMY WITH INTRAOPERATIVE CHOLANGIOGRAM;  Surgeon: Excell Seltzer, MD;  Location: [facility];  Service: General;  Laterality: N/A;  . COLONOSCOPY    . CYSTOSCOPY W/ STONE MANIPULATION    . DILATION AND CURETTAGE OF UTERUS    . ENDOMETRIAL ABLATION    . ESOPHAGOGASTRODUODENOSCOPY (EGD) WITH ESOPHAGEAL DILATION  "several times"  . HERNIA REPAIR    . JOINT REPLACEMENT    . KIDNEY POLYPS REMOVED    . KNEE ARTHROSCOPY Bilateral   . LAPAROSCOPIC NISSEN FUNDOPLICATION  [id number]   Procedure: LAPAROSCOPIC NISSEN FUNDOPLICATION;  Surgeon: Pedro Earls, MD;  Location: [facility];  Service: General;  Laterality: N/A;  . NASAL SEPTUM SURGERY  2000s   "w/adenoids and hyoid bone removed"  . REVISION TOTAL KNEE ARTHROPLASTY Left 05/12/2017  . TONSILLECTOMY  2004   T&A- due to sleep apnea   . TOTAL KNEE ARTHROPLASTY Left 07/13/2015  Procedure: TOTAL KNEE ARTHROPLASTY;  Surgeon: Frederik Pear, MD;  Location: [place];  Service: Orthopedics;  Laterality: Left;  . TOTAL KNEE REVISION Left 05/12/2017   Procedure: TOTAL KNEE REVISION;  Surgeon: Frederik Pear, MD;  Location: [place];  Service: Orthopedics;  Laterality: Left;  . TUBAL LIGATION    . UPPER GASTROINTESTINAL ENDOSCOPY    . UPPER GI ENDOSCOPY  02/03/2012   Procedure: UPPER GI ENDOSCOPY;  Surgeon: Pedro Earls, MD;  Location: [facility];  Service: General;;  . VAGINAL HYSTERECTOMY    . WISDOM TOOTH EXTRACTION      Social History: Social History   Tobacco Use  . Smoking status: Never Smoker  . Smokeless tobacco: Never Used  Substance Use Topics  . Alcohol use: No   . Drug use: No   Additional social history: lives alone  Please also refer to relevant sections of EMR.  Family History: Family History  Problem Relation Age of Onset  . Heart disease Mother   . Heart disease Father   . Diabetes Father   . Colon cancer Maternal Grandmother   . Colon polyps Neg Hx   . Esophageal cancer Neg Hx   . Rectal cancer Neg Hx   . Stomach cancer Neg Hx      Allergies and Medications: Allergies  Allergen Reactions  . Adhesive [Tape] Hives  . Iodine Hives and Rash  . Lisinopril Cough  . Cephalexin Hives and Rash  . Compazine [Prochlorperazine Edisylate]     Hyperactivity  . Latex Itching  . Oxycodone-Acetaminophen Itching and Rash   No current facility-administered medications on file prior to encounter.   Current Outpatient Medications on File Prior to Encounter  Medication Sig Dispense Refill  . benzonatate (TESSALON) 100 MG capsule Take 1 capsule (100 mg total) by mouth every 8 (eight) hours for 5 days. 15 capsule 0  . cetirizine (ZYRTEC) 10 MG tablet Take 10 mg by mouth daily as needed for allergies.    . Melatonin 5 MG TABS Take 5 mg by mouth at bedtime.    Marland Kitchen olmesartan-hydrochlorothiazide (BENICAR HCT) 40-25 MG tablet Take 1 tablet by mouth daily. 90 tablet 0  . omeprazole (PRILOSEC) 40 MG capsule Take 1 capsule (40 mg total) by mouth daily. 90 capsule 3  . sertraline (ZOLOFT) 100 MG tablet Take 1.5 tablets (150 mg total) by mouth daily. 45 tablet 3  . [DISCONTINUED] cholestyramine light (PREVALITE) 4 g packet 1/2 packet twice daily (Patient not taking: Reported on 08/20/2019) 30 packet 3  . [DISCONTINUED] colestipol (COLESTID) 1 g tablet Take 1 tablet (1 g total) by mouth 2 (two) times daily. (Patient not taking: Reported on 08/20/2019) 60 tablet 1    Objective: BP (!) 143/83   Pulse 84   Temp 99.1 F (37.3 C) (Oral)   Resp 19   Ht 5\' 9"  (1.753 m)   Wt 115.2 kg   SpO2 97%   BMI 37.50 kg/m  Exam: Physical Exam Constitutional:       General: She is not in acute distress.    Appearance: She is well-developed. She is obese.  HENT:     Mouth/Throat:     Mouth: Mucous membranes are moist.     Pharynx: Oropharynx is clear. No pharyngeal swelling.  Eyes:     Extraocular Movements: Extraocular movements intact.     Pupils: Pupils are equal, round, and reactive to light.  Cardiovascular:     Rate and Rhythm: Normal rate and regular rhythm.  Heart sounds: No murmur.  Pulmonary:     Effort: Pulmonary effort is normal.     Breath sounds: Normal breath sounds.     Comments: Moderate air movement in all lung fields with rhonchi noted in lower fields bilaterally.  No wheezing appreciated. Chest:     Chest wall: No mass or tenderness.  Abdominal:     General: Bowel sounds are normal.     Palpations: Abdomen is soft.     Comments: Mild tenderness in the left upper quadrant to palpation.  Musculoskeletal:     Cervical back: Normal range of motion and neck supple.     Right lower leg: No tenderness. No edema.     Left lower leg: No tenderness. No edema.  Skin:    General: Skin is warm and dry.     Comments: No skin tenting.  Neurological:     General: No focal deficit present.     Mental Status: She is alert and oriented to person, place, and time.  Psychiatric:        Mood and Affect: Mood is anxious.        Behavior: Behavior normal.      Labs and Imaging: CBC BMET  Recent Labs  Lab 08/19/19 2017  WBC 4.2  HGB 12.7  HCT 40.9  PLT 157   Recent Labs  Lab 08/19/19 2017  NA 137  K 3.8  CL 101  CO2 25  BUN 6  CREATININE 0.79  GLUCOSE 112*  CALCIUM 9.4     DG Chest Portable 1 View  Result Date: 08/20/2019 CLINICAL DATA:  Shortness of breath. EXAM: PORTABLE CHEST 1 VIEW COMPARISON:  08/15/2019 FINDINGS: The cardiac silhouette, mediastinal and hilar contours are within normal limits. Slightly low lung volumes with vascular crowding and streaky basilar atelectasis. Patchy right upper lobe and right lower  lobe infiltrates suspected. No pleural effusions. IMPRESSION: Patchy right lung infiltrates. Electronically Signed   By: Marijo Sanes M.D.   On: 08/20/2019 06:57     Matilde Haymaker, MD 08/20/2019, 10:00 AM PGY-2, [place] Intern pager: [phone], text pages welcome

## 2019-08-20 NOTE — Progress Notes (Signed)
[place] Telemedicine Visit  Patient consented to have virtual visit and was identified by name and date of birth. Method of visit: Video  Encounter participants: Patient: Leslie Martinez - located at [facility] Emergency Department, her cell [phone] Provider: Daisy Floro - located at Clinic Others (if applicable): None  Chief Complaint: COVID +  Patient is currently in the [facility] emergency department, being seen and evaluated for sequelae from [place]. Patient opts to cancel this appointment and instead follow up Virtually tomorrow 08/11/2019, appt has already been made.  Patient instructed to call us back or seek emergency help should she develop worsening shortness of breath.   Time spent during visit with patient: 5 minutes   Milus Banister, [place], PGY-2 08/20/2019 10:05 AM

## 2019-08-20 NOTE — Telephone Encounter (Signed)
Patient calls nurse line to get CXR results. Patient informed of below. Patient appreciative and will speak with provider tomorrow during scheduled apt.

## 2019-08-20 NOTE — ED Provider Notes (Signed)
[facility] Provider Note   CSN: [id number] Arrival date & time: 08/19/19  1924     History Chief Complaint  Patient presents with  . Shortness of Breath    Leslie Martinez is a 54 y.o. female with history of anxiety, chronic bronchitis, kidney stones, GERD, asthma, and COVID-19 who presents the emergency department with a chief complaint of shortness of breath.  The patient reports that she tested positive for COVID-19 on 5/6.  She reports that she has been having fever and chills at home, fatigue, anorexia, nausea, generalized abdominal pain, shortness of breath, and bilateral, sharp, nonradiating chest pain.  Symptoms have persisted since onset, but have significantly worsened over the last 24 hours, which prompted her to return to the emergency department.  Is intermittently having dizziness and lightheadedness with ambulation due to shortness of breath.  She states "I feel so terrible, and I'm scared I'm going to die at home alone."  She denies frank swelling, orthopnea, back pain, dysuria, hematuria, or  vaginal bleeding or discharge.   She received monoclonal antibody infusion on 5/16.  Fever has been improving with Tylenol.  She is unable to take NSAIDs.  She reports that she has been using albuterol for shortness of breath with minimal improvement.  The history is provided by the patient. No language interpreter was used.       Past Medical History:  Diagnosis Date  . Anxiety   . Asthma    seasonal allergies, dry cough today   . 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 Cervical cancer ([place])    "stage III; discovered S/P hysterectomy" (05/12/2017)  . Chronic bronchitis ([place])   . Depression   . Esophageal stricture   . Esophagitis   . Gastritis   . GERD (gastroesophageal reflux disease)   . Hiatal hernia    history of, treated with surgery  . History of blood transfusion 1990s   "related to childbirth"  . History of kidney stones    passed  stones spontaneously, also cystoscopy- done   . Hypertension   . Migraine    "monthly" (05/12/2017)  . Osteoarthritis of both knees   . Pancreatitis   . Pneumonia 2000s X1  . Pre-diabetes   . 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 Seasonal allergies   . Sleep apnea    Had nasal sx, and uses no cpap    Patient Active Problem List   Diagnosis Date Noted  . Adjustment disorder with mixed anxiety and depressed mood 06/08/2018  . Diarrhea 06/08/2018  . Encounter for screening for malignant neoplasm of breast 06/08/2018  . Allergic conjunctivitis of left eye 04/16/2018  . Cholecystitis with cholelithiasis 07/20/2017  . Acute cholecystitis   . Obesity, diabetes, and hypertension syndrome ([place])   . History of Prinzmetal angina   . Dysphagia   . Type 2 diabetes mellitus without complication ([place]) [id number]  . 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 Depression 07/23/2015  . Generalized anxiety disorder 07/23/2015  . Primary osteoarthritis of knee 07/13/2015  . Primary osteoarthritis of left knee 07/11/2015  . 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 Lap Nissen Nov 2013 02/06/2012  . Essential hypertension 01/09/2008  . 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 Asthma, chronic 01/09/2008  . BARRETT'S ESOPHAGUS, HX OF 01/09/2008  . NEPHROLITHIASIS, HX OF 01/09/2008  . DYSPHAGIA UNSPECIFIED 11/26/2007    Past Surgical History:  Procedure Laterality Date  . APPENDECTOMY    . CARDIAC CATHETERIZATION  1990's  X 1  . CHOLECYSTECTOMY N/A 07/20/2017   Procedure: LAPAROSCOPIC CHOLECYSTECTOMY WITH INTRAOPERATIVE CHOLANGIOGRAM;  Surgeon: Excell Seltzer, MD;  Location: [facility];  Service: General;  Laterality: N/A;  . COLONOSCOPY    . CYSTOSCOPY W/ STONE MANIPULATION    . DILATION AND CURETTAGE OF UTERUS    . ENDOMETRIAL ABLATION    . ESOPHAGOGASTRODUODENOSCOPY (EGD) WITH ESOPHAGEAL DILATION  "several times"  . HERNIA REPAIR    . JOINT REPLACEMENT    . KIDNEY POLYPS REMOVED      . KNEE ARTHROSCOPY Bilateral   . LAPAROSCOPIC NISSEN FUNDOPLICATION  [id number]   Procedure: LAPAROSCOPIC NISSEN FUNDOPLICATION;  Surgeon: Pedro Earls, MD;  Location: [facility];  Service: General;  Laterality: N/A;  . NASAL SEPTUM SURGERY  2000s   "w/adenoids and hyoid bone removed"  . REVISION TOTAL KNEE ARTHROPLASTY Left 05/12/2017  . TONSILLECTOMY  2004   T&A- due to sleep apnea   . TOTAL KNEE ARTHROPLASTY Left 07/13/2015   Procedure: TOTAL KNEE ARTHROPLASTY;  Surgeon: Frederik Pear, MD;  Location: [place];  Service: Orthopedics;  Laterality: Left;  . TOTAL KNEE REVISION Left 05/12/2017   Procedure: TOTAL KNEE REVISION;  Surgeon: Frederik Pear, MD;  Location: [place];  Service: Orthopedics;  Laterality: Left;  . TUBAL LIGATION    . UPPER GASTROINTESTINAL ENDOSCOPY    . UPPER GI ENDOSCOPY  02/03/2012   Procedure: UPPER GI ENDOSCOPY;  Surgeon: Pedro Earls, MD;  Location: [facility];  Service: General;;  . VAGINAL HYSTERECTOMY    . WISDOM TOOTH EXTRACTION       OB History   No obstetric history on file.     Family History  Problem Relation Age of Onset  . Heart disease Mother   . Heart disease Father   . Diabetes Father   . Colon cancer Maternal Grandmother   . Colon polyps Neg Hx   . Esophageal cancer Neg Hx   . Rectal cancer Neg Hx   . Stomach cancer Neg Hx     Social History   Tobacco Use  . Smoking status: Never Smoker  . Smokeless tobacco: Never Used  Substance Use Topics  . Alcohol use: No  . Drug use: No    Home Medications Prior to Admission medications   Medication Sig Start Date End Date Taking? Authorizing Provider  benzonatate (TESSALON) 100 MG capsule Take 1 capsule (100 mg total) by mouth every 8 (eight) hours for 5 days. 08/15/19 08/20/19 Yes Couture, Cortni S, PA-C  cetirizine (ZYRTEC) 10 MG tablet Take 10 mg by mouth daily as needed for allergies.   Yes [provider]  Melatonin 5 MG TABS Take 5 mg by mouth at bedtime.   Yes [provider]  olmesartan-hydrochlorothiazide (BENICAR HCT) 40-25 MG tablet Take 1 tablet by mouth daily. 04/16/18  Yes Riccio, Gardiner Rhyme, DO  omeprazole (PRILOSEC) 40 MG capsule Take 1 capsule (40 mg total) by mouth daily. 08/15/16  Yes Nandigam, Venia Minks, MD  sertraline (ZOLOFT) 100 MG tablet Take 1.5 tablets (150 mg total) by mouth daily. 06/08/18  Yes Meccariello, Bernita Raisin, DO  cholestyramine light (PREVALITE) 4 g packet 1/2 packet twice daily Patient not taking: Reported on 08/20/2019 09/19/18 08/20/19  Mauri Pole, MD  colestipol (COLESTID) 1 g tablet Take 1 tablet (1 g total) by mouth 2 (two) times daily. Patient not taking: Reported on 08/20/2019 11/01/18 08/20/19  Mauri Pole, MD    Allergies    Adhesive [tape], Iodine, Lisinopril, Cephalexin, Compazine [  prochlorperazine edisylate], Latex, and Oxycodone-acetaminophen  Review of Systems   Review of Systems  Constitutional: Positive for appetite change, chills, fatigue and fever. Negative for activity change.  HENT: Negative for congestion and sore throat.   Respiratory: Positive for cough and shortness of breath. Negative for wheezing.   Cardiovascular: Positive for chest pain. Negative for palpitations and leg swelling.  Gastrointestinal: Positive for nausea. Negative for abdominal pain, constipation, diarrhea and vomiting.  Genitourinary: Negative for dysuria, flank pain, hematuria and urgency.  Musculoskeletal: Negative for back pain, joint swelling, neck pain and neck stiffness.  Skin: Negative for rash.  Allergic/Immunologic: Negative for immunocompromised state.  Neurological: Negative for seizures, syncope, weakness, numbness and headaches.  Psychiatric/Behavioral: Negative for confusion.    Physical Exam Updated Vital Signs BP (!) 150/95   Pulse 75   Temp 99.1 F (37.3 C) (Oral)   Resp 18   Ht 5\' 9"  (1.753 m)   Wt 115.2 kg   SpO2 100%   BMI 37.50 kg/m   Physical Exam Vitals and nursing note reviewed.    Constitutional:      General: She is not in acute distress.    Appearance: She is not ill-appearing, toxic-appearing or diaphoretic.  HENT:     Head: Normocephalic.     Mouth/Throat:     Mouth: Mucous membranes are moist.  Eyes:     Extraocular Movements: Extraocular movements intact.     Conjunctiva/sclera: Conjunctivae normal.     Pupils: Pupils are equal, round, and reactive to light.  Cardiovascular:     Rate and Rhythm: Normal rate and regular rhythm.     Heart sounds: No murmur. No friction rub. No gallop.   Pulmonary:     Effort: Pulmonary effort is normal. No respiratory distress.     Comments: Lung sounds are diminished throughout.  No adventitious breath sounds.  No retractions or accessory muscle use. Abdominal:     General: There is no distension.     Palpations: Abdomen is soft. There is no mass.     Tenderness: There is abdominal tenderness. There is no right CVA tenderness, left CVA tenderness, guarding or rebound.     Hernia: No hernia is present.     Comments: Mild tenderness to palpation throughout the abdomen without rebound or guarding.  Musculoskeletal:     Cervical back: Neck supple.     Right lower leg: No edema.     Left lower leg: No edema.  Skin:    General: Skin is warm.     Capillary Refill: Capillary refill takes less than 2 seconds.     Findings: No rash.  Neurological:     Mental Status: She is alert.  Psychiatric:        Behavior: Behavior normal.    ED Results / Procedures / Treatments   Labs (all labs ordered are listed, but only abnormal results are displayed) Labs Reviewed  COMPREHENSIVE METABOLIC PANEL - Abnormal; Notable for the following components:      Result Value   Glucose, Bld 112 (*)    All other components within normal limits  URINALYSIS, ROUTINE W REFLEX MICROSCOPIC - Abnormal; Notable for the following components:   Hgb urine dipstick MODERATE (*)    All other components within normal limits  CBC     EKG None  Radiology DG Chest Portable 1 View  Result Date: 08/20/2019 CLINICAL DATA:  Shortness of breath. EXAM: PORTABLE CHEST 1 VIEW COMPARISON:  08/15/2019 FINDINGS: The cardiac silhouette, mediastinal and hilar  contours are within normal limits. Slightly low lung volumes with vascular crowding and streaky basilar atelectasis. Patchy right upper lobe and right lower lobe infiltrates suspected. No pleural effusions. IMPRESSION: Patchy right lung infiltrates. Electronically Signed   By: Marijo Sanes M.D.   On: 08/20/2019 06:57    Procedures Procedures (including critical care time)  Medications Ordered in ED Medications  ondansetron (ZOFRAN-ODT) disintegrating tablet 4 mg (has no administration in time range)  albuterol (VENTOLIN HFA) 108 (90 Base) MCG/ACT inhaler 8 puff (has no administration in time range)  AeroChamber Plus Flo-Vu Large MISC 1 each (has no administration in time range)  guaiFENesin (ROBITUSSIN) 100 MG/5ML solution 100 mg (has no administration in time range)  dexamethasone (DECADRON) tablet 6 mg (has no administration in time range)  ondansetron (ZOFRAN-ODT) disintegrating tablet 4 mg (4 mg Oral Given 08/19/19 1951)  acetaminophen (TYLENOL) tablet 650 mg (650 mg Oral Given 08/19/19 2001)    ED Course  I have reviewed the triage vital signs and the nursing notes.  Pertinent labs & imaging results that were available during my care of the patient were reviewed by me and considered in my medical decision making (see chart for details).    MDM Rules/Calculators/A&P                      54 year old female with history of anxiety, chronic bronchitis, kidney stones, GERD, asthma, and COVID-19 who presents to the emergency department with a chief complaint of shortness of breath.  Patient reports that symptoms have significantly worsened over the last 24 hours.  She has had a very poor appetite and has been very nauseated over the last few days.  Vital signs are  reassuring in the ER.  Chest x-ray with patchy right lung infiltrates, progressed from chest x-ray on 5/13.  On exam, lungs are diminished throughout.  However, she has no retractions accessory muscle use.  Labs are overall reassuring.  She was treated with a friend, albuterol inhaler, and was ambulated in the department.  Oxygen saturation 94%.  However, patient reports that she was very dyspneic while ambulating.  She reports that given that she has diabetes that she is very concerned so she lives at home alone and her symptoms have significantly worsened in the last 24 hours and she would feel much safer with admission.  Consulted the family medicine residency team and spoke with Pilar Plate.  The family medicine residency team will come evaluate the patient in the ER.   The patient appears reasonably stabilized for admission considering the current resources, flow, and capabilities available in the ED at this time, and I doubt any other [facility] requiring further screening and/or treatment in the ED prior to admission.  Final Clinical Impression(s) / ED Diagnoses Final diagnoses:  Pneumonia due to COVID-19 virus    Rx / DC Orders ED Discharge Orders    None       Latonya Knight A, PA-C 08/20/19 [id number]    Ward, Delice Bison, DO 08/21/19 [id number]

## 2019-08-20 NOTE — Consult Note (Signed)
[facility] Admission History and Physical Service Pager: [phone]  Patient name: Leslie Martinez          Medical record number: [id number] Date of birth: 1965-04-19          Age: 54 y.o.    Gender: female  Primary Care Provider: Cleophas Dunker, DO  Chief Complaint: Worsening S OB and generalized muscle aches  Assessment and Plan: Leslie Martinez is a 54 y.o. female presenting with worsened shortness of breath and muscle aches. PMH is significant for obesity and diabetes  COVID-19 infection Leslie Martinez presented to the ED today for subjectively worsened symptoms of COVID-19.  She showed no evidence of hypoxia and passed an ambulatory screen with saturations between 94 and 98%.  She is not febrile today in the emergency department and vitals are generally within normal limits.  Initial blood work is unremarkable for significant signs of dehydration or infection.  We discussed that Leslie Martinez does not meet any admission criteria for her condition.  She was encouraged to consider outpatient treatment where she would receive the same medications without the requirement of monitoring in the inpatient setting.  She expressed anxiety about returning home because she lives alone with no one to keep an eye on her.  Following further discussion with Leslie Martinez and Leslie Martinez, it was decided that Leslie Martinez would return home to continue her convalescence.  We will plan to monitor her closely via telemedicine appointments from the clinic and prescribed appropriate medications that she can remain comfortable.  She was discharged from the emergency room. -Follow-up via telemedicine tomorrow with Dr. Shan Levans -Zofran as needed for nausea -Albuterol as needed for wheezing/S OB -Tylenol as needed for fever -She was instructed to stop taking her antihypertensive medication until she begins to feel better taking better p.o.   Disposition: Discharged from emergency  department  History of Present Illness:  Leslie Martinez is a 54 y.o. female presenting with subjective worsening of COVID-19 symptoms.  She has a previous medical history significant for obesity and diabetes.  Leslie Martinez was initially diagnosed with a COVID-19 infection on 5/6.  Since that time, she has been isolating at home without significant issue.  She experienced some worsening symptoms of muscle aches and moderate shortness of breath this past Thursday and spoke with her primary care physician.  She was set up with a monoclonal antibody infusion which she received on 5/16.  She was tolerating her symptoms well until yesterday at around 4:00 when she began to notice worsened muscle aches around her shoulders and mid back and worsened shortness of breath with exertion.  She also noted a chest achiness with inhalation.  She denied left arm pain, jaw pain.  With these worsening symptoms, she grew increasingly distressed and she was concerned she would do very poorly at home and there would be no one to take care of her or monitor her as she lives alone.  She presented to the ED and was found to be oxygenating well with no significant abnormalities to her vitals.  Initial labs were entirely unremarkable.  Chest x-ray showed mild progression of her COVID-19 pneumonia.  She was given dexamethasone, albuterol and Zofran in the ED.  She noted mild improvement of her symptoms with albuterol.    Review Of Systems: Per HPI with the following additions:   Review of Systems  Constitutional: Positive for malaise/fatigue. Negative for chills and fever (in the last 3 days).  HENT:  Negative for hearing loss and sore throat.   Eyes: Negative for blurred vision.  Respiratory: Positive for cough, sputum production and shortness of breath. Negative for hemoptysis and wheezing.   Cardiovascular: Positive for chest pain. Negative for palpitations and leg swelling.  Gastrointestinal: Positive for diarrhea  (mild, none today) and nausea. Negative for abdominal pain, constipation, heartburn and vomiting.  Genitourinary: Negative for dysuria.  Musculoskeletal: Positive for back pain and myalgias.  Skin: Negative for rash.  Neurological: Negative for dizziness, tremors and headaches.      Patient Active Problem List   Diagnosis Date Noted  . Adjustment disorder with mixed anxiety and depressed mood 06/08/2018  . Diarrhea 06/08/2018  . Encounter for screening for malignant neoplasm of breast 06/08/2018  . Allergic conjunctivitis of left eye 04/16/2018  . Cholecystitis with cholelithiasis 07/20/2017  . Acute cholecystitis   . Obesity, diabetes, and hypertension syndrome ([place])   . History of Prinzmetal angina   . Dysphagia   . Type 2 diabetes mellitus without complication ([place]) [id number]  . 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 Depression 07/23/2015  . Generalized anxiety disorder 07/23/2015  . Primary osteoarthritis of knee 07/13/2015  . Primary osteoarthritis of left knee 07/11/2015  . 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 Lap Nissen Nov 2013 02/06/2012  . Essential hypertension 01/09/2008  . 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 Asthma, chronic 01/09/2008  . BARRETT'S ESOPHAGUS, HX OF 01/09/2008  . NEPHROLITHIASIS, HX OF 01/09/2008  . DYSPHAGIA UNSPECIFIED 11/26/2007   Past Medical History:      Past Medical History:  Diagnosis Date  . Anxiety   . Asthma    seasonal allergies, dry cough today   . 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 Cervical cancer ([place])    "stage III; discovered S/P hysterectomy" (05/12/2017)  . Chronic bronchitis ([place])   . Depression   . Esophageal stricture   . Esophagitis   . Gastritis   . GERD (gastroesophageal reflux disease)   . Hiatal hernia    history of, treated with surgery  . History of blood transfusion 1990s   "related to childbirth"  . History of kidney stones    passed stones spontaneously, also cystoscopy- done   . Hypertension   .  Migraine    "monthly" (05/12/2017)  . Osteoarthritis of both knees   . Pancreatitis   . Pneumonia 2000s X1  . Pre-diabetes   . 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 Seasonal allergies   . Sleep apnea    Had nasal sx, and uses no cpap   Past Surgical History:       Past Surgical History:  Procedure Laterality Date  . APPENDECTOMY    . CARDIAC CATHETERIZATION  1990's X 1  . CHOLECYSTECTOMY N/A 07/20/2017   Procedure: LAPAROSCOPIC CHOLECYSTECTOMY WITH INTRAOPERATIVE CHOLANGIOGRAM; Surgeon: Excell Seltzer, MD; Location: [facility]; Service: General; Laterality: N/A;  . COLONOSCOPY    . CYSTOSCOPY W/ STONE MANIPULATION    . DILATION AND CURETTAGE OF UTERUS    . ENDOMETRIAL ABLATION    . ESOPHAGOGASTRODUODENOSCOPY (EGD) WITH ESOPHAGEAL DILATION  "several times"  . HERNIA REPAIR    . JOINT REPLACEMENT    . KIDNEY POLYPS REMOVED    . KNEE ARTHROSCOPY Bilateral   . LAPAROSCOPIC NISSEN FUNDOPLICATION  [id number]   Procedure: LAPAROSCOPIC NISSEN FUNDOPLICATION; Surgeon: Pedro Earls, MD; Location: [facility]; Service: General; Laterality: N/A;  . NASAL SEPTUM SURGERY  2000s   "w/adenoids and hyoid bone removed"  . REVISION TOTAL  KNEE ARTHROPLASTY Left 05/12/2017  . TONSILLECTOMY  2004   T&A- due to sleep apnea   . TOTAL KNEE ARTHROPLASTY Left 07/13/2015   Procedure: TOTAL KNEE ARTHROPLASTY; Surgeon: Frederik Pear, MD; Location: [facility]; Service: Orthopedics; Laterality: Left;  . TOTAL KNEE REVISION Left 05/12/2017   Procedure: TOTAL KNEE REVISION; Surgeon: Frederik Pear, MD; Location: [facility]; Service: Orthopedics; Laterality: Left;  . TUBAL LIGATION    . UPPER GASTROINTESTINAL ENDOSCOPY    . UPPER GI ENDOSCOPY  02/03/2012   Procedure: UPPER GI ENDOSCOPY; Surgeon: Pedro Earls, MD; Location: [facility]; Service: General;;  . VAGINAL HYSTERECTOMY    . WISDOM TOOTH EXTRACTION     Social History:  Social History       Tobacco Use  . Smoking status: Never Smoker  . Smokeless tobacco:  Never Used  Substance Use Topics  . Alcohol use: No  . Drug use: No   Additional social history: lives alone  Please also refer to relevant sections of EMR.  Family History:       Family History  Problem Relation Age of Onset  . Heart disease Mother   . Heart disease Father   . Diabetes Father   . Colon cancer Maternal Grandmother   . Colon polyps Neg Hx   . Esophageal cancer Neg Hx   . Rectal cancer Neg Hx   . Stomach cancer Neg Hx    Allergies and Medications:       Allergies  Allergen Reactions  . Adhesive [Tape] Hives  . Iodine Hives and Rash  . Lisinopril Cough  . Cephalexin Hives and Rash  . Compazine [Prochlorperazine Edisylate]     Hyperactivity  . Latex Itching  . Oxycodone-Acetaminophen Itching and Rash   No current facility-administered medications on file prior to encounter.         Current Outpatient Medications on File Prior to Encounter  Medication Sig Dispense Refill  . benzonatate (TESSALON) 100 MG capsule Take 1 capsule (100 mg total) by mouth every 8 (eight) hours for 5 days. 15 capsule 0  . cetirizine (ZYRTEC) 10 MG tablet Take 10 mg by mouth daily as needed for allergies.    . Melatonin 5 MG TABS Take 5 mg by mouth at bedtime.    Marland Kitchen olmesartan-hydrochlorothiazide (BENICAR HCT) 40-25 MG tablet Take 1 tablet by mouth daily. 90 tablet 0  . omeprazole (PRILOSEC) 40 MG capsule Take 1 capsule (40 mg total) by mouth daily. 90 capsule 3  . sertraline (ZOLOFT) 100 MG tablet Take 1.5 tablets (150 mg total) by mouth daily. 45 tablet 3  . [DISCONTINUED] cholestyramine light (PREVALITE) 4 g packet 1/2 packet twice daily (Patient not taking: Reported on 08/20/2019) 30 packet 3  . [DISCONTINUED] colestipol (COLESTID) 1 g tablet Take 1 tablet (1 g total) by mouth 2 (two) times daily. (Patient not taking: Reported on 08/20/2019) 60 tablet 1     Objective: BP (!) 143/83   Pulse 84   Temp 99.1 F (37.3 C) (Oral)   Resp 19   Ht 5\' 9"  (1.753 m)   Wt 115.2 kg    SpO2 97%   BMI 37.50 kg/m  Exam: Physical Exam Constitutional:      General: She is not in acute distress.    Appearance: She is well-developed. She is obese.  HENT:     Mouth/Throat:     Mouth: Mucous membranes are moist.     Pharynx: Oropharynx is clear. No pharyngeal swelling.  Eyes:  Extraocular Movements: Extraocular movements intact.     Pupils: Pupils are equal, round, and reactive to light.  Cardiovascular:     Rate and Rhythm: Normal rate and regular rhythm.     Heart sounds: No murmur.  Pulmonary:     Effort: Pulmonary effort is normal.     Breath sounds: Normal breath sounds.     Comments: Moderate air movement in all lung fields with rhonchi noted in lower fields bilaterally.  No wheezing appreciated. Chest:     Chest wall: No mass or tenderness.  Abdominal:     General: Bowel sounds are normal.     Palpations: Abdomen is soft.     Comments: Mild tenderness in the left upper quadrant to palpation.  Musculoskeletal:     Cervical back: Normal range of motion and neck supple.     Right lower leg: No tenderness. No edema.     Left lower leg: No tenderness. No edema.  Skin:    General: Skin is warm and dry.     Comments: No skin tenting.  Neurological:     General: No focal deficit present.     Mental Status: She is alert and oriented to person, place, and time.  Psychiatric:        Mood and Affect: Mood is anxious.        Behavior: Behavior normal.   DG Chest Portable 1 View  Result Date: 08/20/2019 CLINICAL DATA:  Shortness of breath. EXAM: PORTABLE CHEST 1 VIEW COMPARISON:  08/15/2019 FINDINGS: The cardiac silhouette, mediastinal and hilar contours are within normal limits. Slightly low lung volumes with vascular crowding and streaky basilar atelectasis. Patchy right upper lobe and right lower lobe infiltrates suspected. No pleural effusions. IMPRESSION: Patchy right lung infiltrates. Electronically Signed   By: Marijo Sanes M.D.   On: 08/20/2019 06:57      Matilde Haymaker, MD 08/20/2019, 10:00 AM PGY-2, [place] [facility] pager: [phone], text pages welcome

## 2019-08-20 NOTE — ED Notes (Signed)
Patient given discharge instructions patient verbalizes understanding. 

## 2019-08-20 NOTE — Discharge Instructions (Signed)
You presented to the [facility] emergency department due to worsening symptoms from a COVID-19 infection.  Based on the assessment done in the emergency room, you do not meet criteria for inpatient hospitalization.  Glad you are not having any low oxygen levels or issues that require more intensive medical care.  For now, the best course of action would be to continue treating him at home.  He will be discharged from the emergency room today with several medications to help you recover comfortably at home and we will plan to follow you closely in clinic with telemedicine visits.  You have a telemedicine appointment tomorrow at 1030.  You should receive a text on your phone around 1030 for that appointment.  While you are feeling unwell, you should stop taking your blood pressure medication.  If you continue to take your blood pressure medication it could contribute to dehydration.  You can resume your normal blood pressure medication once you have made a significant recovery.  I have put in a prescription for Zofran to help you with nausea while you are at home.  You can take him the albuterol inhaler you have with you and in your room.  I have also given you a short supply of Ativan 1 mg to use at night which will help reduce anxiety and hopefully provide better sleep.

## 2019-08-20 NOTE — Telephone Encounter (Signed)
Received fax from [facility] stating that the Rx for pts albuterol needs clarification.    sig: 8 puffs at once? Please clarify, we cannot dispense this high of a dose   Routing to doctor that prescribed this medication so they can either change or notify the pharmacy of what it should be.Neomia Herbel Zimmerman Rumple, CMA

## 2019-08-21 ENCOUNTER — Telehealth (INDEPENDENT_AMBULATORY_CARE_PROVIDER_SITE_OTHER): Payer: HRSA Program | Admitting: Family Medicine

## 2019-08-21 ENCOUNTER — Other Ambulatory Visit: Payer: Self-pay

## 2019-08-21 VITALS — Ht 69.0 in | Wt 253.0 lb

## 2019-08-21 DIAGNOSIS — U071 COVID-19: Secondary | ICD-10-CM | POA: Diagnosis not present

## 2019-08-21 DIAGNOSIS — J1282 Pneumonia due to coronavirus disease 2019: Secondary | ICD-10-CM

## 2019-08-21 MED ORDER — ALBUTEROL SULFATE HFA 108 (90 BASE) MCG/ACT IN AERS: 2.0000 | INHALATION_SPRAY | RESPIRATORY_TRACT | 1 refills | Status: DC | PRN

## 2019-08-21 NOTE — Telephone Encounter (Signed)
Dr. Shan Levans had this pt on her schedule today and I told her about this and she made the change and sent it in to pharmacy.Marland KitchenApril Zimmerman Rumple, CMA

## 2019-08-21 NOTE — Progress Notes (Signed)
[place] Telemedicine Visit  Patient consented to have virtual visit and was identified by name and date of birth. Method of visit: Video  Encounter participants: Patient: Leslie Martinez - located at home Provider: Kathrene Alu - located at [facility] Others (if applicable): none  Chief Complaint: f/u Covid pneumonia  HPI:  Leslie Martinez reports that she continues to have mild shortness of breath, some cough, but denies fever.  She is able to ambulate at home without significant shortness of breath.  She is able to eat and drink and has had normal urination.  She has friends and family that she can check in with each day, although she lives alone.  She has also been supplied with albuterol as needed, Zofran, and Ativan.  She says that she use the Ativan to help her sleep last night, and it was helpful.  She has had minimal nausea since using the Zofran.  She feels that she is overall improved from yesterday.  ROS: per HPI  Pertinent PMHx: Obesity, hypertension, asthma, type 2 diabetes, anxiety, depression  Exam:  Ht 5\' 9"  (1.753 m)   Wt 253 lb (114.8 kg) Comment: Pt reported  BMI 37.36 kg/m   Respiratory: No increased work of breathing at rest and during conversation, minimal cough, appears comfortable  Assessment/Plan:  Pneumonia due to COVID-19 virus I am reassured today that patient does not have increased shortness of breath and is able to speak in full sentences without difficulty.  Also reassured that she is feeling better today compared to yesterday.  I told patient that she will likely continue to slowly recover and reviewed supportive care measures, including Zofran for nausea, fluid intake, rest, and contacting family and friends if she needs support.  I also gave her return precautions for calling our clinic or for going to the emergency department.  We have set up a virtual visit for her to follow-up on Friday, May 21.    Time spent during visit with  patient: 10 minutes

## 2019-08-22 ENCOUNTER — Encounter (INDEPENDENT_AMBULATORY_CARE_PROVIDER_SITE_OTHER): Payer: Self-pay

## 2019-08-22 NOTE — Assessment & Plan Note (Addendum)
I am reassured today that patient does not have increased shortness of breath and is able to speak in full sentences without difficulty.  Also reassured that she is feeling better today compared to yesterday.  I told patient that she will likely continue to slowly recover and reviewed supportive care measures, including Zofran for nausea, fluid intake, rest, and contacting family and friends if she needs support.  I also gave her return precautions for calling our clinic or for going to the emergency department.  We have set up a virtual visit for her to follow-up on Friday, May 21.

## 2019-08-23 ENCOUNTER — Other Ambulatory Visit: Payer: Self-pay

## 2019-08-23 ENCOUNTER — Encounter: Payer: Self-pay | Admitting: Family Medicine

## 2019-08-23 ENCOUNTER — Telehealth (INDEPENDENT_AMBULATORY_CARE_PROVIDER_SITE_OTHER): Payer: HRSA Program | Admitting: Family Medicine

## 2019-08-23 DIAGNOSIS — J1282 Pneumonia due to coronavirus disease 2019: Secondary | ICD-10-CM

## 2019-08-23 DIAGNOSIS — U071 COVID-19: Secondary | ICD-10-CM | POA: Diagnosis not present

## 2019-08-23 NOTE — Assessment & Plan Note (Addendum)
Improving. Day 16 of Covid illness with minimal cough/SOB remaining and afebrile for the past 48 hours.  Reassuringly breathing without difficulty during our conversation.  Discussed continued conservative measures with OTC analgesics/Zofran, hydration, and slowly increasing her physical activity.  Work return date scheduled for 5/28, scheduled a virtual visit with Dr. Pilar Plate on 5/26 to ensure she feels ready to return.

## 2019-08-23 NOTE — Progress Notes (Signed)
[facility] Telemedicine Visit  Patient consented to have virtual visit and was identified by name and date of birth. Method of visit: Video  Encounter participants: Patient: Leslie Martinez - located at home  Provider: Patriciaann Clan - located at [facility]  Others (if applicable): None   Chief Complaint: f/u COVID pneumonia   HPI: Leslie Martinez is a 54 year old female presenting for follow-up of Covid pneumonia.  Diagnosed with Covid around 08/08/2019 and COVID pneumonia on 5/13. She recently met virtually with Dr. Shan Levans on 5/20 for follow-up, endorsed some mild continued shortness of breath and cough however was doing overall well.   Today, states she is continues to do well.  No fever in the past 2 days.  Cough is improving, still has some phlegm.  Scheduled to be out of work through 5/27 (first day back would be 5/28).  Works as an Therapist, sports at [employer].  She has been trying to walk around her house to work up her stamina and tolerating this well.   ROS: per HPI  Pertinent PMHx: Hypertension, elevated BMI, history of acute cholecystitis s/p cholecystectomy, type 2 diabetes, anxiety  Exam:  There were no vitals taken for this visit.  General: No acute distress HEENT: Mucous membranes moist Respiratory: Unlabored breathing, speaking in full sentences.   Assessment/Plan:  Pneumonia due to COVID-19 virus Improving. Day 16 of Covid illness with minimal cough/SOB remaining and afebrile for the past 48 hours.  Reassuringly breathing without difficulty during our conversation.  Discussed continued conservative measures with OTC analgesics/Zofran, hydration, and slowly increasing her physical activity.  Work return date scheduled for 5/28, scheduled a virtual visit with Dr. Pilar Plate on 5/26 to ensure she feels ready to return.    Follow-up on 5/26 scheduled during visit.  Follow-up sooner if any worsening shortness of breath, chest pain, confusion, or difficulty maintaining  hydration.  Time spent during visit with patient: 12 minutes  Patriciaann Clan, DO

## 2019-08-27 NOTE — Progress Notes (Signed)
Leslie Martinez  Patient consented to have virtual Martinez and was identified by name and date of birth. Method of Martinez: Video  Encounter participants: Patient: Leslie Martinez - located at home Provider: Matilde Haymaker - located at [facility] Others (if applicable): none  Chief Complaint: Covid Pneumonia  HPI:  COVID-19 infection Leslie Martinez was first diagnosed with Covid on 5/6.  She received an infusion of monoclonal antibodies on 5/16.  The infusion was administered without issue.  Due to increased shortness of breath she was seen in the ED on 5/18 and was ultimately discharged home with close follow-up.  She has had multiple telephone encounters and has been recovering well.  Today, she reports that she has made marked improvement and rarely feels any shortness of breath.  She is able to walk around her home and walk to the mailbox without significant shortness of breath.  She denies any chest pain or lower extremity swelling.  Overall, she reports she feels well and ready to return to work.  Sleep disturbance Since the start of her Covid infection, she has had significant trouble sleeping.  When she was discharged from the ED on 5/18, she was given 5 tablets of Ativan to help decrease her anxiety and improve her sleeping.  She reports that she has taken the Ativan twice without significant improvement to her sleeping.  She has been taking melatonin 3 mg as well.  She wants to know if anything more can be done for her sleep disturbance at this time.  She reports that she recently purchased some Benadryl to see if this would be helpful as a sleep aid.  ROS: per HPI  Pertinent PMHx: Asthma, obesity (risk factors for severe Covid infection)  Exam:  General: Well-appearing middle-aged woman walking comfortably around her house during our video encounter.  No acute distress. Respiratory: Breathing comfortably on room air.  No issue completing long sentences without  effort.  Assessment/Plan:  Pneumonia due to COVID-19 virus Showing significant improvement.  Safe and appropriate to return to work at this time.  Following a brief discussion about light duty versus normal duty, she reported that she would like to return to work with full responsibilities. -No additional follow-up needed for COVID-19 -She was encouraged to follow-up again if she noticed persistent shortness of breath even after several months (she notes only mild shortness of breath with exertion).  Sleep disturbance We reviewed basic sleep hygiene: No screens 1 hour prior to bedtime, the bedroom is only for sleeping, melatonin is appropriate.  She was told that she can continue to use her remaining Ativan if that is helpful although it is not necessary to use the Ativan if she finds no benefit.  Is also told that she can try to use Benadryl as a sleep aid that may leave her with a "hangover effect."  She was encouraged to come in clinic for a more thorough discussion about the sleep disturbance if it does not resolve soon.    Time spent during Martinez with patient: 10 minutes

## 2019-08-28 ENCOUNTER — Other Ambulatory Visit: Payer: Self-pay

## 2019-08-28 ENCOUNTER — Encounter: Payer: Self-pay | Admitting: Family Medicine

## 2019-08-28 ENCOUNTER — Telehealth (INDEPENDENT_AMBULATORY_CARE_PROVIDER_SITE_OTHER): Payer: HRSA Program | Admitting: Family Medicine

## 2019-08-28 DIAGNOSIS — U071 COVID-19: Secondary | ICD-10-CM

## 2019-08-28 DIAGNOSIS — J1282 Pneumonia due to coronavirus disease 2019: Secondary | ICD-10-CM | POA: Diagnosis not present

## 2019-08-28 DIAGNOSIS — G479 Sleep disorder, unspecified: Secondary | ICD-10-CM

## 2019-08-28 NOTE — Assessment & Plan Note (Signed)
We reviewed basic sleep hygiene: No screens 1 hour prior to bedtime, the bedroom is only for sleeping, melatonin is appropriate.  She was told that she can continue to use her remaining Ativan if that is helpful although it is not necessary to use the Ativan if she finds no benefit.  Is also told that she can try to use Benadryl as a sleep aid that may leave her with a "hangover effect."  She was encouraged to come in clinic for a more thorough discussion about the sleep disturbance if it does not resolve soon.

## 2019-08-28 NOTE — Assessment & Plan Note (Signed)
Showing significant improvement.  Safe and appropriate to return to work at this time.  Following a brief discussion about light duty versus normal duty, she reported that she would like to return to work with full responsibilities. -No additional follow-up needed for COVID-19 -She was encouraged to follow-up again if she noticed persistent shortness of breath even after several months (she notes only mild shortness of breath with exertion).

## 2020-04-21 ENCOUNTER — Encounter: Payer: Self-pay | Admitting: Family Medicine

## 2020-04-23 ENCOUNTER — Ambulatory Visit: Payer: Self-pay

## 2020-04-30 ENCOUNTER — Ambulatory Visit: Payer: Self-pay

## 2020-05-07 ENCOUNTER — Ambulatory Visit: Payer: Self-pay

## 2020-05-07 NOTE — Patient Instructions (Incomplete)
Thank you for coming to see me today. It was a pleasure. Today we talked about:   We will get some labs today.  If they are abnormal or we need to do something about them, I will call you.  If they are normal, I will send you a message on MyChart (if it is active) or a letter in the mail.  If you don't hear from [place] in 2 weeks, please call the office at the number below.  We will start a controller medication for your asthma to use everyday.    We will start buspar for your mood.  You can take this 3 times a day.  Please watch your blood pressure and go to the emergency room or call if it is in the 200s again or you have chest pain, shortness of breath, or vision changes with this.  I have placed an order for your mammogram.  Please call [facility] at [phone] to schedule your appointment within one week.  You are due for a Pap Smear.  Please schedule an appointment to come back at your earliest convenience to have this completed.  Please follow-up with me in 1 month.  If you have any questions or concerns, please do not hesitate to call the office at [phone].  Best,   [place] Constable, DO

## 2020-05-07 NOTE — Progress Notes (Signed)
SUBJECTIVE:   CHIEF COMPLAINT / HPI:   Previous COVID-19 infection Patient diagnosed with COVID-19 on 08/08/2019 Continues to have shortness of breath, anxiety, brain fog  Gets the scent of ammonia Gets body aches occasionally, gets tested and is negative She has not been vaccinated but would like to be She walks a lot at work, when she walks a lot, she gets more short of breath Has a history of asthma, uses albuterol only if wheezing, not with shortness of breath Has coughing with this No fevers Never smoker  Anxiety/Depression Last seen by this provider on 06/08/2018 for this At that time her Zoloft was increased from 100 to 150 mg She is still taking this Doesn't take anything else for mood No SI Feels like before COVID had more depression than anxiety Now she feels anxious, worries a lot, has trouble sleeping Sometimes can sleep all day Has very low energy level  GAD 7 : Generalized Anxiety Score 05/08/2020 06/14/2018 06/08/2018  Nervous, Anxious, on Edge 1 1 3   Control/stop worrying 2 2 3   Worry too much - different things 2 1 3   Trouble relaxing 3 1 3   Restless 3 1 2   Easily annoyed or irritable 3 1 3   Afraid - awful might happen 2 2 3   Total GAD 7 Score 16 9 20   Anxiety Difficulty Not difficult at all Somewhat difficult Very difficult   [place] Office Visit from 05/08/2020 in [facility]  PHQ-9 Total Score 11      Hypertension Current regimen: Olmesartan-hydrochlorothiazide 40-25 mg daily Takes every day BPs at home: high the past few days, was 202/119 last night No CP, shortness of breath at that time, changes in vision  Handicap Placcard Hx b/l knee replacements  Has difficulty walking because of this Would like a handicap placcard form completed   COVID Vaccination Has not had a Covid vaccination but would like to have her first 1 today  Needs Mammogram Needs Pap smear  PERTINENT  PMH / PSH: HTN, T2DM, chronic asthma,  history of Barrett's esophagus, adjustment disorder with mixed anxiety and depression  OBJECTIVE:   BP (!) 162/92   Pulse 61   Wt 255 lb (115.7 kg)   SpO2 98%   BMI 37.66 kg/m    Physical Exam:  General: 55 y.o. female in NAD Neck: Supple, no thyromegaly palpated Cardio: RRR no m/r/g Lungs: CTAB, no wheezing, no rhonchi, no crackles, no IWOB on RA Skin: warm and dry Extremities: No edema Psych: No SI, mood and affect appropriate for circumstance, appropriate dress, thought process linear and logical, good insight   ASSESSMENT/PLAN:   Fatigue Patient is concerned that she has long haulers Covid given that she has been having these symptoms since having Covid in May 2021.  Could also consider worsening of anxiety and depression causing fatigue, will treat per below.  Will obtain labs to rule out other causes, BMP, TSH, CBC today.  Also need to make sure that she is up-to-date on age-appropriate cancer screening.  She is up-to-date on colonoscopy, however needs mammogram and Pap smear.  She was made aware of this today and mammogram ordered.  Advised her to come back for a Pap smear.  We will have her follow-up in 1 month.  Asthma, chronic Could consider that shortness of breath is related to prior Covid in May, however cannot rule out worsening of asthma at this time.  We will go ahead and start her on a controller.  Prescription for Symbicort sent, could consider increasing her dose if still having problems at follow-up.  She has a cough, but this only seems to occur when she has difficulty breathing after exerting herself.  Would hold off on chest x-ray at this time.  Reassuringly, she does not smoke.  She has no fevers and does not seem to have any signs or symptoms of pneumonia at this time.  Will obtain CBC to rule out anemia.  Anxiety state Symptoms of anxiety are worsening.  Could consider that this is related to prior Covid, could also consider she is having a worsening of already  known underlying depression and now having anxiety symptoms.  She is already on Zoloft 150 mg daily, advised to continue this.  We will start her on BuSpar 5 mg 3 times daily advised that she can use this scheduled or as needed depending on her preference.  Discussed therapy, however she declines at this time and states that she has previously done therapy through her work and would like to pursue this.  Also obtained labs per above to rule out other causes of anxiety including TSH.  Follow-up in 1 month.  Essential hypertension Blood pressure is elevated today and patient reports significantly elevated blood pressures.  Attempted to place patient on another blood pressure medication, specifically amlodipine, however she would like to hold off on adding any medications.  She was advised that it is recommended that she be placed on another medication especially given she had blood pressures in the 200s.  She again adamantly denied and stated that she would like to work on this on her own.  Discussed return precautions including blood pressures in the 056P systolics, especially if accompanied by chest pain, shortness of breath, or changes in her vision.  She voiced understanding.  Will she will follow-up in 1 month.  BMP obtained today.  History of bilateral knee replacement Patient reports a history of bilateral knee replacements and has difficulty walking because of this.  Will go ahead and sign a handicap placard for her.   Patient received her first [place] vaccination today.  She will come back in 21 days for second.  She will follow-up in 1 month and at that time we will discuss diabetes.  She will need a repeat A1c at that time.  Cleophas Dunker, [place]

## 2020-05-08 ENCOUNTER — Ambulatory Visit (INDEPENDENT_AMBULATORY_CARE_PROVIDER_SITE_OTHER): Payer: Self-pay | Admitting: Family Medicine

## 2020-05-08 ENCOUNTER — Other Ambulatory Visit: Payer: Self-pay

## 2020-05-08 ENCOUNTER — Encounter: Payer: Self-pay | Admitting: Family Medicine

## 2020-05-08 VITALS — BP 162/92 | HR 61 | Wt 255.0 lb

## 2020-05-08 DIAGNOSIS — Z1231 Encounter for screening mammogram for malignant neoplasm of breast: Secondary | ICD-10-CM

## 2020-05-08 DIAGNOSIS — J4540 Moderate persistent asthma, uncomplicated: Secondary | ICD-10-CM

## 2020-05-08 DIAGNOSIS — R5383 Other fatigue: Secondary | ICD-10-CM | POA: Insufficient documentation

## 2020-05-08 DIAGNOSIS — Z96653 Presence of artificial knee joint, bilateral: Secondary | ICD-10-CM

## 2020-05-08 DIAGNOSIS — F411 Generalized anxiety disorder: Secondary | ICD-10-CM

## 2020-05-08 DIAGNOSIS — Z23 Encounter for immunization: Secondary | ICD-10-CM

## 2020-05-08 DIAGNOSIS — I10 Essential (primary) hypertension: Secondary | ICD-10-CM

## 2020-05-08 MED ORDER — BUSPIRONE HCL 5 MG PO TABS: 5.0000 mg | ORAL_TABLET | Freq: Three times a day (TID) | ORAL | 1 refills | Status: DC

## 2020-05-08 MED ORDER — BUDESONIDE-FORMOTEROL FUMARATE 80-4.5 MCG/ACT IN AERO: 2.0000 | INHALATION_SPRAY | Freq: Two times a day (BID) | RESPIRATORY_TRACT | 3 refills | Status: DC

## 2020-05-08 NOTE — Assessment & Plan Note (Signed)
Blood pressure is elevated today and patient reports significantly elevated blood pressures.  Attempted to place patient on another blood pressure medication, specifically amlodipine, however she would like to hold off on adding any medications.  She was advised that it is recommended that she be placed on another medication especially given she had blood pressures in the 200s.  She again adamantly denied and stated that she would like to work on this on her own.  Discussed return precautions including blood pressures in the 803O systolics, especially if accompanied by chest pain, shortness of breath, or changes in her vision.  She voiced understanding.  Will she will follow-up in 1 month.  BMP obtained today.

## 2020-05-08 NOTE — Assessment & Plan Note (Signed)
Symptoms of anxiety are worsening.  Could consider that this is related to prior Covid, could also consider she is having a worsening of already known underlying depression and now having anxiety symptoms.  She is already on Zoloft 150 mg daily, advised to continue this.  We will start her on BuSpar 5 mg 3 times daily advised that she can use this scheduled or as needed depending on her preference.  Discussed therapy, however she declines at this time and states that she has previously done therapy through her work and would like to pursue this.  Also obtained labs per above to rule out other causes of anxiety including TSH.  Follow-up in 1 month.

## 2020-05-08 NOTE — Assessment & Plan Note (Signed)
Could consider that shortness of breath is related to prior Covid in May, however cannot rule out worsening of asthma at this time.  We will go ahead and start her on a controller.  Prescription for Symbicort sent, could consider increasing her dose if still having problems at follow-up.  She has a cough, but this only seems to occur when she has difficulty breathing after exerting herself.  Would hold off on chest x-ray at this time.  Reassuringly, she does not smoke.  She has no fevers and does not seem to have any signs or symptoms of pneumonia at this time.  Will obtain CBC to rule out anemia.

## 2020-05-08 NOTE — Assessment & Plan Note (Addendum)
Patient is concerned that she has long haulers Covid given that she has been having these symptoms since having Covid in May 2021.  Could also consider worsening of anxiety and depression causing fatigue, will treat per below.  Will obtain labs to rule out other causes, BMP, TSH, CBC today.  Also need to make sure that she is up-to-date on age-appropriate cancer screening.  She is up-to-date on colonoscopy, however needs mammogram and Pap smear.  She was made aware of this today and mammogram ordered.  Advised her to come back for a Pap smear.  We will have her follow-up in 1 month.

## 2020-05-08 NOTE — Assessment & Plan Note (Signed)
Patient reports a history of bilateral knee replacements and has difficulty walking because of this.  Will go ahead and sign a handicap placard for her.

## 2020-05-09 LAB — CBC WITH DIFFERENTIAL/PLATELET
Basophils Absolute: 0 10*3/uL (ref 0.0–0.2)
Basos: 1 %
EOS (ABSOLUTE): 0.1 10*3/uL (ref 0.0–0.4)
Eos: 3 %
Hematocrit: 41.3 % (ref 34.0–46.6)
Hemoglobin: 13.4 g/dL (ref 11.1–15.9)
Immature Grans (Abs): 0 10*3/uL (ref 0.0–0.1)
Immature Granulocytes: 0 %
Lymphocytes Absolute: 2 10*3/uL (ref 0.7–3.1)
Lymphs: 62 %
MCH: 26.7 pg (ref 26.6–33.0)
MCHC: 32.4 g/dL (ref 31.5–35.7)
MCV: 82 fL (ref 79–97)
Monocytes Absolute: 0.3 10*3/uL (ref 0.1–0.9)
Monocytes: 9 %
Neutrophils Absolute: 0.8 10*3/uL — ABNORMAL LOW (ref 1.4–7.0)
Neutrophils: 25 %
Platelets: 170 10*3/uL (ref 150–450)
RBC: 5.02 x10E6/uL (ref 3.77–5.28)
RDW: 12.9 % (ref 11.7–15.4)
WBC: 3.1 10*3/uL — ABNORMAL LOW (ref 3.4–10.8)

## 2020-05-09 LAB — BASIC METABOLIC PANEL
BUN/Creatinine Ratio: 15 (ref 9–23)
BUN: 11 mg/dL (ref 6–24)
CO2: 22 mmol/L (ref 20–29)
Calcium: 9.9 mg/dL (ref 8.7–10.2)
Chloride: 102 mmol/L (ref 96–106)
Creatinine, Ser: 0.71 mg/dL (ref 0.57–1.00)
GFR calc Af Amer: 111 mL/min/{1.73_m2} (ref >59)
GFR calc non Af Amer: 96 mL/min/{1.73_m2} (ref >59)
Glucose: 122 mg/dL — ABNORMAL HIGH (ref 65–99)
Potassium: 3.9 mmol/L (ref 3.5–5.2)
Sodium: 138 mmol/L (ref 134–144)

## 2020-05-09 LAB — TSH: TSH: 2.12 u[IU]/mL (ref 0.450–4.500)

## 2020-05-11 ENCOUNTER — Encounter: Payer: Self-pay | Admitting: Family Medicine

## 2020-05-11 MED ORDER — OLMESARTAN MEDOXOMIL-HCTZ 40-25 MG PO TABS: 1.0000 | ORAL_TABLET | Freq: Every day | ORAL | 0 refills | Status: DC

## 2020-05-26 ENCOUNTER — Ambulatory Visit: Payer: Self-pay

## 2020-05-29 ENCOUNTER — Ambulatory Visit: Payer: Self-pay

## 2020-05-31 ENCOUNTER — Other Ambulatory Visit: Payer: Self-pay | Admitting: Family Medicine

## 2020-05-31 DIAGNOSIS — F411 Generalized anxiety disorder: Secondary | ICD-10-CM

## 2020-06-01 ENCOUNTER — Other Ambulatory Visit: Payer: Self-pay

## 2020-06-01 ENCOUNTER — Ambulatory Visit (INDEPENDENT_AMBULATORY_CARE_PROVIDER_SITE_OTHER): Payer: BC Managed Care – PPO

## 2020-06-01 DIAGNOSIS — Z23 Encounter for immunization: Secondary | ICD-10-CM | POA: Diagnosis not present

## 2020-06-01 NOTE — Progress Notes (Signed)
   Covid-19 Vaccination Clinic  Name:  Leslie Martinez    MRN: [id number] DOB: [birth year]  06/01/2020   Patient presents to nurse clinic for second COVID vaccine. Administered in LD, site unremarkable, tolerated injection well.   [name] was observed post Covid-19 immunization for 15 minutes without incident. She was provided with Vaccine Information Sheet and instruction to access the V-Safe system.   [name] was instructed to call 911 with any severe reactions post vaccine: Marland Kitchen Difficulty breathing  . Swelling of face and throat  . A fast heartbeat  . A bad rash all over body  . Dizziness and weakness   Immunizations Administered    Name Date Dose VIS Date Route   PFIZER Comrnaty(Gray TOP) Covid-19 Vaccine 06/01/2020  3:18 PM 0.3 mL 03/12/2020 Intramuscular   Manufacturer: [employer], [employer]   Lot: [id number]   NDC: [phone]     Provided patient with updated immunization card.   Talbot Grumbling, RN

## 2020-07-29 ENCOUNTER — Encounter: Payer: Self-pay | Admitting: Family Medicine

## 2020-10-12 ENCOUNTER — Encounter: Payer: Self-pay | Admitting: Family Medicine

## 2020-10-14 ENCOUNTER — Ambulatory Visit (INDEPENDENT_AMBULATORY_CARE_PROVIDER_SITE_OTHER): Payer: BC Managed Care – PPO | Admitting: Family Medicine

## 2020-10-14 ENCOUNTER — Other Ambulatory Visit: Payer: Self-pay | Admitting: Family Medicine

## 2020-10-14 ENCOUNTER — Other Ambulatory Visit: Payer: Self-pay

## 2020-10-14 ENCOUNTER — Encounter: Payer: Self-pay | Admitting: Family Medicine

## 2020-10-14 VITALS — BP 132/60 | HR 55 | Ht 69.0 in | Wt 259.9 lb

## 2020-10-14 DIAGNOSIS — R319 Hematuria, unspecified: Secondary | ICD-10-CM | POA: Diagnosis not present

## 2020-10-14 DIAGNOSIS — R350 Frequency of micturition: Secondary | ICD-10-CM

## 2020-10-14 DIAGNOSIS — E119 Type 2 diabetes mellitus without complications: Secondary | ICD-10-CM | POA: Diagnosis not present

## 2020-10-14 LAB — POCT URINALYSIS DIP (MANUAL ENTRY)
Bilirubin, UA: NEGATIVE
Glucose, UA: NEGATIVE mg/dL
Ketones, POC UA: NEGATIVE mg/dL
Leukocytes, UA: NEGATIVE
Nitrite, UA: NEGATIVE
Protein Ur, POC: NEGATIVE mg/dL
Spec Grav, UA: 1.025 (ref 1.010–1.025)
Urobilinogen, UA: 0.2 E.U./dL
pH, UA: 6.5 (ref 5.0–8.0)

## 2020-10-14 LAB — POCT GLYCOSYLATED HEMOGLOBIN (HGB A1C): HbA1c, POC (controlled diabetic range): 6.5 % (ref 0.0–7.0)

## 2020-10-14 NOTE — Patient Instructions (Addendum)
It was great seeing you today!  You came in for concern for UTI, and the urinalysis did not show infection, but we are obtaining a urine culture and if anything is positive I will give you a call and we can treat accordingly. Your urinalysis did show some blood, but we will look under the microscope to check and if there is still blood we will refer to urology.  We are also going to do labs to check your kidney function and to check your hemoglobin A1c.  .  Try to increase your water intake, if your symptoms worsen please give [place] a call at the clinic.   Please check-out at the front desk before leaving the clinic. Please schedule to see your PCP in about 3 months to follow up on diabetes and health maintenance items, but if you need to be seen earlier than that for any new issues we're happy to fit you in, just give [place] a call!   If you haven't already, sign up for My Chart to have easy access to your labs results, and communication with your primary care physician.  Feel free to call with any questions or concerns at any time, at [phone].   Take care,  Dr. Shary Key [facility]

## 2020-10-14 NOTE — Progress Notes (Signed)
    SUBJECTIVE:    Leslie Martinez is a 55 y.o. female who complains of urinary frequency and urgency x 5 days, without flank pain, fever, chills, or abnormal vaginal discharge or bleeding. Has tried cranberry juice without improvement. She states she has not been eating much carbs (not intentionally) or drinking much water and has felt this way in the past when she did not have carbs and was not well hydrated. She also states she has done a UA at work (jail) and noted blood. Denies gross blood in urine.   Additionally, she requests to check her A1c. States she checks sugars occasionally at home which have been normal    OBJECTIVE:   BP 132/60   Pulse (!) 55   Ht 5\' 9"  (1.753 m)   Wt 259 lb 14.8 oz (117.9 kg)   SpO2 99%   BMI 38.38 kg/m    General: Appears well, in no apparent distress.  CV: RRR no murmurs Resp: CTAB normal WOB GI: soft without tenderness  No CVA tenderness    ASSESSMENT/PLAN:   No problem-specific Assessment & Plan notes found for this encounter.  Urinary frequency and urgency  Patient reports feeling like she has to urinate frequently but often unable to urinate much for the past 5 days. States she has felt this way in the past when she did not eat much carbs or drink much water. Denies acute abdominal or back pain, just feels general discomfort. UA with small blood, negative for infection. Will plan to have it looked under microscope and if positive for blood will refer to urology.  - f/u micro - f/u urine culture  - BMP   DM2 Not on medication. A1c today 6.5 slightly up from 6.1 2 years ago. Continue managing with diet and exercise  [place]

## 2020-10-14 NOTE — Progress Notes (Signed)
d 

## 2020-10-15 LAB — BASIC METABOLIC PANEL
BUN/Creatinine Ratio: 14 (ref 9–23)
BUN: 9 mg/dL (ref 6–24)
CO2: 22 mmol/L (ref 20–29)
Calcium: 9.4 mg/dL (ref 8.7–10.2)
Chloride: 104 mmol/L (ref 96–106)
Creatinine, Ser: 0.65 mg/dL (ref 0.57–1.00)
Glucose: 118 mg/dL — ABNORMAL HIGH (ref 65–99)
Potassium: 3.8 mmol/L (ref 3.5–5.2)
Sodium: 141 mmol/L (ref 134–144)
eGFR: 103 mL/min/{1.73_m2} (ref >59)

## 2020-10-16 LAB — URINE CULTURE

## 2020-10-21 ENCOUNTER — Other Ambulatory Visit: Payer: Self-pay

## 2020-10-21 ENCOUNTER — Ambulatory Visit
Admission: RE | Admit: 2020-10-21 | Discharge: 2020-10-21 | Disposition: A | Discharge status: Home / Self Care | Payer: BC Managed Care – PPO | Source: Ambulatory Visit | Attending: Family Medicine | Admitting: Family Medicine

## 2020-10-21 DIAGNOSIS — Z1231 Encounter for screening mammogram for malignant neoplasm of breast: Secondary | ICD-10-CM

## 2020-10-23 ENCOUNTER — Other Ambulatory Visit: Payer: Self-pay | Admitting: Family Medicine

## 2020-10-23 ENCOUNTER — Ambulatory Visit: Payer: BC Managed Care – PPO | Admitting: Nurse Practitioner

## 2020-10-23 DIAGNOSIS — R928 Other abnormal and inconclusive findings on diagnostic imaging of breast: Secondary | ICD-10-CM

## 2020-11-09 ENCOUNTER — Telehealth: Payer: BC Managed Care – PPO | Admitting: Physician Assistant

## 2020-11-09 ENCOUNTER — Other Ambulatory Visit (HOSPITAL_COMMUNITY): Payer: Self-pay

## 2020-11-09 ENCOUNTER — Other Ambulatory Visit: Payer: BC Managed Care – PPO

## 2020-11-09 DIAGNOSIS — U071 COVID-19: Secondary | ICD-10-CM

## 2020-11-09 MED ORDER — ONDANSETRON 4 MG PO TBDP
ORAL_TABLET | ORAL | 0 refills | Status: DC
  Filled 2020-11-09: qty 10, 2d supply, fill #0

## 2020-11-09 MED ORDER — NIRMATRELVIR/RITONAVIR (PAXLOVID)TABLET
ORAL_TABLET | ORAL | 0 refills | Status: DC
  Filled 2020-11-09: qty 30, 5d supply, fill #0

## 2020-11-09 MED ORDER — FLUTICASONE PROPIONATE 50 MCG/ACT NA SUSP
2.0000 | Freq: Every day | NASAL | 0 refills | Status: DC
  Filled 2020-11-09: qty 9.9, fill #0

## 2020-11-09 NOTE — Patient Instructions (Signed)
COVID-19  - Paxlovid - normal creatinine in 10/2020, medications checked - pt is to hold buspar for the duration of the Paxlovid course  - zofran for nausea/vomiting  - flonase for congestion  - rest, fluids, tylenol and ibuprofen for body aches and fevers  - continue albuterol as needed  - to ER for oxygen saturations <90% or worsening shortness of breath

## 2020-11-09 NOTE — Progress Notes (Signed)
[name], bena are scheduled for a virtual visit with your provider today.    Just as we do with appointments in the office, we must obtain your consent to participate.  Your consent will be active for this visit and any virtual visit you may have with one of our providers in the next 365 days.    If you have a MyChart account, I can also send a copy of this consent to you electronically.  All virtual visits are billed to your insurance company just like a traditional visit in the office.  As this is a virtual visit, video technology does not allow for your provider to perform a traditional examination.  This may limit your provider's ability to fully assess your condition.  If your provider identifies any concerns that need to be evaluated in person or the need to arrange testing such as labs, EKG, etc, we will make arrangements to do so.    Although advances in technology are sophisticated, we cannot ensure that it will always work on either your end or our end.  If the connection with a video visit is poor, we may have to switch to a telephone visit.  With either a video or telephone visit, we are not always able to ensure that we have a secure connection.   I need to obtain your verbal consent now.   Are you willing to proceed with your visit today?   Leslie Martinez has provided verbal consent on 11/09/2020 for a virtual visit (video or telephone).   [name], PA-C 11/09/2020  8:31 AM   Date:  11/09/2020   ID:  [name], DOB Nov 07, 1965, MRN [id number]  Patient Location: Home Provider Location: Home Office   Participants: Patient and Provider for Visit and Wrap up  Method of visit: Video  Location of Patient: Home Location of Provider: Home Office Consent was obtain for visit over the video. Services rendered by provider: Visit was performed via video  A video enabled telemedicine application was used and I verified that I am speaking with the correct person using two  identifiers.  PCP:  Alcus Dad, MD   Chief Complaint:  COVID-19  History of Present Illness:    Leslie Martinez is a 56 y.o. female with history as stated below. Presents video telehealth for an acute care visit  Pt reports she tested positive on Friday.  Has  previously had COVID (May 2021)with long COVID symptoms.  Pt reports she has both albuterol and Symbicort with some relief. She is also taking Vitamin D, Mucinex.  She has associated nausea, vomiting and diarrhea. Symptoms began Thursday night.    Pt is vaccinated.    Hx of HTN and asthma.  Medications reviewed with patient.  No other aggravating or relieving factors.  No other c/o.  The patient does have symptoms concerning for COVID-19 infection (fever, chills, cough, or new shortness of breath).  Patient has been tested for COVID during this illness - result: positive.  Past Medical, Surgical, Social History, Allergies, and Medications have been Reviewed.  Patient Active Problem List   Diagnosis Date Noted   Fatigue 05/08/2020   History of bilateral knee replacement 05/08/2020   Sleep disturbance 08/28/2019   Pneumonia due to COVID-19 virus 08/20/2019   Adjustment disorder with mixed anxiety and depressed mood 06/08/2018   Diarrhea 06/08/2018   Encounter for screening for malignant neoplasm of breast 06/08/2018   Allergic conjunctivitis of left eye 04/16/2018   Cholecystitis with cholelithiasis  07/20/2017   Acute cholecystitis    Obesity, diabetes, and hypertension syndrome ([place])    History of Prinzmetal angina    Dysphagia    Type 2 diabetes mellitus without complication ([place]) [id number]   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   Depression 07/23/2015   Anxiety state 07/23/2015   Primary osteoarthritis of knee 07/13/2015   Primary osteoarthritis of left knee 07/11/2015   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   Lap Nissen Nov 2013 02/06/2012   Essential hypertension 01/09/2008   PRINZMETAL'S  ANGINA 01/09/2008   Asthma, chronic 01/09/2008   BARRETT'S ESOPHAGUS, HX OF 01/09/2008   NEPHROLITHIASIS, HX OF 01/09/2008   DYSPHAGIA UNSPECIFIED 11/26/2007    Social History   Tobacco Use   Smoking status: Never   Smokeless tobacco: Never  Substance Use Topics   Alcohol use: No     Current Outpatient Medications:    albuterol (VENTOLIN HFA) 108 (90 Base) MCG/ACT inhaler, Inhale 2 puffs into the lungs every 4 (four) hours as needed for wheezing or shortness of breath., Disp: 8 g, Rfl: 1   budesonide-formoterol (SYMBICORT) 80-4.5 MCG/ACT inhaler, Inhale 2 puffs into the lungs 2 (two) times daily., Disp: 1 each, Rfl: 3   busPIRone (BUSPAR) 5 MG tablet, TAKE 1 TABLET BY MOUTH THREE TIMES A DAY, Disp: 270 tablet, Rfl: 1   cetirizine (ZYRTEC) 10 MG tablet, Take 10 mg by mouth daily as needed for allergies., Disp: , Rfl:    Melatonin 5 MG TABS, Take 5 mg by mouth at bedtime., Disp: , Rfl:    olmesartan-hydrochlorothiazide (BENICAR HCT) 40-25 MG tablet, Take 1 tablet by mouth daily., Disp: 90 tablet, Rfl: 0   omeprazole (PRILOSEC) 40 MG capsule, Take 1 capsule (40 mg total) by mouth daily., Disp: 90 capsule, Rfl: 3   sertraline (ZOLOFT) 100 MG tablet, Take 1.5 tablets (150 mg total) by mouth daily., Disp: 45 tablet, Rfl: 3   Allergies  Allergen Reactions   Adhesive [Tape] Hives   Iodine Hives and Rash   Lisinopril Cough   Cephalexin Hives and Rash   Compazine [Prochlorperazine Edisylate]     Hyperactivity   Latex Itching   Oxycodone-Acetaminophen Itching and Rash     Review of Systems  Constitutional:  Positive for chills and fever.  HENT:  Positive for congestion and sore throat. Negative for ear pain.   Eyes:  Negative for blurred vision and double vision.  Respiratory:  Positive for cough, shortness of breath and wheezing.   Cardiovascular:  Negative for chest pain, palpitations and leg swelling.  Gastrointestinal:  Positive for diarrhea, nausea and vomiting. Negative for  abdominal pain.  Genitourinary:  Negative for dysuria.  Musculoskeletal:  Positive for myalgias.  Skin:  Negative for rash.  Neurological:  Negative for loss of consciousness, weakness and headaches.  Psychiatric/Behavioral:  The patient is not nervous/anxious.   See HPI for history of present illness.  Physical Exam Vitals and nursing note reviewed.  Constitutional:      General: She is not in acute distress.    Appearance: She is well-developed. She is not diaphoretic.     Comments: Awake, alert, nontoxic appearance  HENT:     Head: Normocephalic and atraumatic.     Nose: Congestion present.     Mouth/Throat:     Pharynx: No oropharyngeal exudate.  Eyes:     General: No scleral icterus.    Conjunctiva/sclera: Conjunctivae normal.  Cardiovascular:     Rate and Rhythm: Normal rate and regular rhythm.  Pulmonary:  Effort: Pulmonary effort is normal. No respiratory distress.     Breath sounds: Normal breath sounds. No wheezing.     Comments: Speaks in full sentences Abdominal:     General: Bowel sounds are normal.     Palpations: Abdomen is soft. There is no mass.     Tenderness: There is no abdominal tenderness. There is no guarding or rebound.  Musculoskeletal:        General: Normal range of motion.     Cervical back: Normal range of motion and neck supple.  Skin:    General: Skin is warm and dry.  Neurological:     Mental Status: She is alert.     Comments: Speech is clear and goal oriented Moves extremities without ataxia  Psychiatric:        Mood and Affect: Mood normal.              A&P  COVID-19  - Paxlovid - normal creatinine in 10/2020, medications checked - pt is to hold buspar for the duration of the Paxlovid course  - zofran for nausea/vomiting  - flonase for congestion  - rest, fluids, tylenol and ibuprofen for body aches and fevers  - continue albuterol as needed  - to ER for oxygen saturations <90% or worsening shortness of breath   Patient  voiced understanding and agreement to plan.   Time:   Today, I have spent 15 minutes with the patient with telehealth technology discussing the above problems, reviewing the chart, previous notes, medications and orders.    Tests Ordered: No orders of the defined types were placed in this encounter.   Medication Changes: No orders of the defined types were placed in this encounter.    Disposition:  Follow up in ER for SPO2 <90%  Signed, [name], PA-C  11/09/2020 8:31 AM

## 2020-11-23 ENCOUNTER — Other Ambulatory Visit: Payer: Self-pay | Admitting: Family Medicine

## 2020-11-23 ENCOUNTER — Other Ambulatory Visit: Payer: Self-pay

## 2020-11-23 ENCOUNTER — Ambulatory Visit
Admission: RE | Admit: 2020-11-23 | Discharge: 2020-11-23 | Disposition: A | Discharge status: Home / Self Care | Payer: BC Managed Care – PPO | Source: Ambulatory Visit | Attending: Family Medicine | Admitting: Family Medicine

## 2020-11-23 ENCOUNTER — Ambulatory Visit: Admission: RE | Admit: 2020-11-23 | Payer: BC Managed Care – PPO | Source: Ambulatory Visit

## 2020-11-23 DIAGNOSIS — R921 Mammographic calcification found on diagnostic imaging of breast: Secondary | ICD-10-CM

## 2020-11-23 DIAGNOSIS — R928 Other abnormal and inconclusive findings on diagnostic imaging of breast: Secondary | ICD-10-CM

## 2020-11-25 ENCOUNTER — Ambulatory Visit
Admission: RE | Admit: 2020-11-25 | Discharge: 2020-11-25 | Disposition: A | Discharge status: Home / Self Care | Payer: BC Managed Care – PPO | Source: Ambulatory Visit | Attending: Family Medicine | Admitting: Family Medicine

## 2020-11-25 ENCOUNTER — Other Ambulatory Visit: Payer: Self-pay

## 2020-11-25 DIAGNOSIS — R921 Mammographic calcification found on diagnostic imaging of breast: Secondary | ICD-10-CM

## 2020-11-30 ENCOUNTER — Other Ambulatory Visit: Payer: BC Managed Care – PPO

## 2020-12-02 ENCOUNTER — Other Ambulatory Visit: Payer: Self-pay | Admitting: Gastroenterology

## 2020-12-14 ENCOUNTER — Encounter: Payer: Self-pay | Admitting: Family Medicine

## 2020-12-16 ENCOUNTER — Other Ambulatory Visit: Payer: Self-pay | Admitting: Family Medicine

## 2020-12-16 MED ORDER — CHOLESTYRAMINE LIGHT 4 G PO PACK: PACK | ORAL | 1 refills | Status: DC

## 2021-01-08 ENCOUNTER — Other Ambulatory Visit: Payer: Self-pay | Admitting: Family Medicine

## 2021-02-12 ENCOUNTER — Other Ambulatory Visit: Payer: Self-pay | Admitting: Family Medicine

## 2021-03-05 ENCOUNTER — Other Ambulatory Visit: Payer: Self-pay | Admitting: Family Medicine

## 2021-09-07 ENCOUNTER — Encounter: Payer: Self-pay | Admitting: *Deleted

## 2021-09-10 ENCOUNTER — Ambulatory Visit
Admission: RE | Admit: 2021-09-10 | Discharge: 2021-09-10 | Disposition: A | Discharge status: Home / Self Care | Payer: No Typology Code available for payment source | Source: Ambulatory Visit | Attending: Obstetrics and Gynecology | Admitting: Obstetrics and Gynecology

## 2021-09-10 ENCOUNTER — Other Ambulatory Visit: Payer: Self-pay | Admitting: Obstetrics and Gynecology

## 2021-09-10 DIAGNOSIS — R7611 Nonspecific reaction to tuberculin skin test without active tuberculosis: Secondary | ICD-10-CM

## 2021-10-11 ENCOUNTER — Telehealth: Payer: No Typology Code available for payment source | Admitting: Nurse Practitioner

## 2021-10-11 DIAGNOSIS — J0110 Acute frontal sinusitis, unspecified: Secondary | ICD-10-CM

## 2021-10-11 DIAGNOSIS — R062 Wheezing: Secondary | ICD-10-CM

## 2021-10-11 MED ORDER — DOXYCYCLINE HYCLATE 100 MG PO TABS: 100.0000 mg | ORAL_TABLET | Freq: Two times a day (BID) | ORAL | 0 refills | Status: AC

## 2021-10-11 MED ORDER — ALBUTEROL SULFATE HFA 108 (90 BASE) MCG/ACT IN AERS: 2.0000 | INHALATION_SPRAY | Freq: Four times a day (QID) | RESPIRATORY_TRACT | 0 refills | Status: AC | PRN

## 2021-10-11 NOTE — Progress Notes (Signed)
Virtual Visit Consent   Leslie Martinez, you are scheduled for a virtual visit with a [facility] provider today. Just as with appointments in the office, your consent must be obtained to participate. Your consent will be active for this visit and any virtual visit you may have with one of our providers in the next 365 days. If you have a MyChart account, a copy of this consent can be sent to you electronically.  As this is a virtual visit, video technology does not allow for your provider to perform a traditional examination. This may limit your provider's ability to fully assess your condition. If your provider identifies any concerns that need to be evaluated in person or the need to arrange testing (such as labs, EKG, etc.), we will make arrangements to do so. Although advances in technology are sophisticated, we cannot ensure that it will always work on either your end or our end. If the connection with a video visit is poor, the visit may have to be switched to a telephone visit. With either a video or telephone visit, we are not always able to ensure that we have a secure connection.  By engaging in this virtual visit, you consent to the provision of healthcare and authorize for your insurance to be billed (if applicable) for the services provided during this visit. Depending on your insurance coverage, you may receive a charge related to this service.  I need to obtain your verbal consent now. Are you willing to proceed with your visit today? AMYLAH WILL has provided verbal consent on 10/11/2021 for a virtual visit (video or telephone). Apolonio Schneiders, FNP  Date: 10/11/2021 10:45 AM  Virtual Visit via Video Note   I, Apolonio Schneiders, connected with  KATELEEN ENCARNACION  ([id number], [birth year]) on 10/11/21 at 10:45 AM EDT by a video-enabled telemedicine application and verified that I am speaking with the correct person using two identifiers.  Location: Patient: Virtual Visit Location Patient:  Home Provider: Virtual Visit Location Provider: Home Office   I discussed the limitations of evaluation and management by telemedicine and the availability of in person appointments. The patient expressed understanding and agreed to proceed.    History of Present Illness: Leslie Martinez is a 56 y.o. who identifies as a female who was assigned female at birth, and is being seen today with complaints of recurrent sinusitis with wheezing.   She takes Zyrtec and Allegra. She also uses Sea Salt nasal spray.  Has not started Flonase   She is also using Albuterol inhaler, was given Symbicort in the past but is not currently using it.   Every 2-3 months she experiences head congestion, fever, and cough, this episode started 4 days ago.   Has had COVID multiple times and has been worked up for long-COVID.  Today temperature is 99.4   Most recent COVID episode was 11/2020 Has taken multiple COVID tests since being ill this round and are negative.   Worse symptoms today are sinus pressure, has a sore throat and has tightness with wheezing.   Chest XRAY from 6/9/20230(performed in follow up to +TB skin test) IMPRESSION: COPD. There are no signs of pulmonary edema or focal pulmonary consolidation. There is no pleural effusion.  Problems:  Patient Active Problem List   Diagnosis Date Noted   Fatigue 05/08/2020   History of bilateral knee replacement 05/08/2020   Sleep disturbance 08/28/2019   Pneumonia due to COVID-19 virus 08/20/2019   Adjustment disorder with mixed anxiety  and depressed mood 06/08/2018   Diarrhea 06/08/2018   Encounter for screening for malignant neoplasm of breast 06/08/2018   Allergic conjunctivitis of left eye 04/16/2018   Cholecystitis with cholelithiasis 07/20/2017   Acute cholecystitis    Obesity, diabetes, and hypertension syndrome ([place])    History of Prinzmetal angina    Dysphagia    Type 2 diabetes mellitus without complication ([place]) 25/95/6387   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   Lap Nissen Nov 2013 02/06/2012   Essential hypertension 01/09/2008   PRINZMETAL'S ANGINA 01/09/2008   Asthma, chronic 01/09/2008   BARRETT'S ESOPHAGUS, HX OF 01/09/2008   NEPHROLITHIASIS, HX OF 01/09/2008   DYSPHAGIA UNSPECIFIED 11/26/2007    Allergies:  Allergies  Allergen Reactions   Adhesive [Tape] Hives   Iodine Hives and Rash   Lisinopril Cough   Cephalexin Hives and Rash   Compazine [Prochlorperazine Edisylate]     Hyperactivity   Latex Itching   Oxycodone-Acetaminophen Itching and Rash   Medications:  Current Outpatient Medications:    albuterol (VENTOLIN HFA) 108 (90 Base) MCG/ACT inhaler, Inhale 2 puffs into the lungs every 4 (four) hours as needed for wheezing or shortness of breath., Disp: 8 g, Rfl: 1   budesonide-formoterol (SYMBICORT) 80-4.5 MCG/ACT inhaler, Inhale 2 puffs into the lungs 2 (two) times daily., Disp: 1 each, Rfl: 3   busPIRone (BUSPAR) 5 MG tablet, TAKE 1 TABLET BY MOUTH THREE TIMES A DAY, Disp: 270 tablet, Rfl: 1   cetirizine (ZYRTEC) 10 MG tablet, Take 10 mg by mouth daily as needed for allergies., Disp: , Rfl:    cholestyramine light (PREVALITE) 4 g packet, TAKE 1/2 PACKET MIXED WITH FLUIDS TWICE DAILY. Patient needs to be seen in the office prior to receiving any additional refills., Disp: 30 packet, Rfl: 0   fluticasone (FLONASE) 50 MCG/ACT nasal spray, Place 2 sprays into both nostrils daily., Disp: 16 g, Rfl: 0   Melatonin 5 MG TABS, Take 5 mg by mouth at bedtime., Disp: , Rfl:    nirmatrelvir/ritonavir EUA (PAXLOVID) TABS, Take 3 tablets by mouth twice daily for 5 days., Disp: 30 tablet, Rfl: 0   olmesartan-hydrochlorothiazide (BENICAR HCT) 40-25 MG tablet, Take 1 tablet by mouth daily., Disp: 90 tablet, Rfl: 0   omeprazole  (PRILOSEC) 40 MG capsule, Take 1 capsule (40 mg total) by mouth daily., Disp: 90 capsule, Rfl: 3   ondansetron (ZOFRAN ODT) 4 MG disintegrating tablet, Dissolve 1 tablet by mouth every 4 hours as needed for nausea/vomit, Disp: 10 tablet, Rfl: 0   sertraline (ZOLOFT) 100 MG tablet, Take 1.5 tablets (150 mg total) by mouth daily., Disp: 45 tablet, Rfl: 3  Observations/Objective: Patient is well-developed, well-nourished in no acute distress.  Resting comfortably  at home.  Head is normocephalic, atraumatic.  No labored breathing.  Speech is clear and coherent with logical content.  Patient is alert and oriented at baseline.    Assessment and Plan: 1. Acute non-recurrent frontal sinusitis  - doxycycline (VIBRA-TABS) 100 MG tablet; Take 1 tablet (100 mg total) by mouth 2 (two) times daily for 10 days.  Dispense: 20 tablet; Refill: 0  2. Wheezing Restart Symbicort as directed  Follow up with PCP regarding recent Chest X-ray results  Refill provided for:  - albuterol (VENTOLIN HFA) 108 (90 Base) MCG/ACT inhaler; Inhale 2 puffs into the  lungs every 6 (six) hours as needed for wheezing or shortness of breath.  Dispense: 8 g; Refill: 0    Restart Flonase and continue OTC allergy regimen  Follow Up Instructions: I discussed the assessment and treatment plan with the patient. The patient was provided an opportunity to ask questions and all were answered. The patient agreed with the plan and demonstrated an understanding of the instructions.  A copy of instructions were sent to the patient via MyChart unless otherwise noted below.    The patient was advised to call back or seek an in-person evaluation if the symptoms worsen or if the condition fails to improve as anticipated.  Time:  I spent 15 minutes with the patient via telehealth technology discussing the above problems/concerns.    Apolonio Schneiders, FNP

## 2021-10-14 ENCOUNTER — Ambulatory Visit
Admission: EM | Admit: 2021-10-14 | Discharge: 2021-10-14 | Disposition: A | Discharge status: Home / Self Care | Payer: BC Managed Care – PPO | Attending: Family Medicine | Admitting: Family Medicine

## 2021-10-14 ENCOUNTER — Ambulatory Visit (INDEPENDENT_AMBULATORY_CARE_PROVIDER_SITE_OTHER): Payer: BC Managed Care – PPO

## 2021-10-14 DIAGNOSIS — J4541 Moderate persistent asthma with (acute) exacerbation: Secondary | ICD-10-CM

## 2021-10-14 DIAGNOSIS — R059 Cough, unspecified: Secondary | ICD-10-CM

## 2021-10-14 DIAGNOSIS — R062 Wheezing: Secondary | ICD-10-CM

## 2021-10-14 DIAGNOSIS — R051 Acute cough: Secondary | ICD-10-CM

## 2021-10-14 DIAGNOSIS — J189 Pneumonia, unspecified organism: Secondary | ICD-10-CM | POA: Diagnosis not present

## 2021-10-14 MED ORDER — AMOXICILLIN-POT CLAVULANATE 875-125 MG PO TABS: 1.0000 | ORAL_TABLET | Freq: Two times a day (BID) | ORAL | 0 refills | Status: AC

## 2021-10-14 MED ORDER — PREDNISONE 20 MG PO TABS: 40.0000 mg | ORAL_TABLET | Freq: Every day | ORAL | 0 refills | Status: AC

## 2021-10-14 NOTE — ED Triage Notes (Signed)
Patient presents to Urgent Care with complaints of chest tightness and fevers since last week. Patient reports last year she hads covid in 2022 and ever since she has struggled with congestion and wheezing she has been on two inhalers and last dose was today.

## 2021-10-14 NOTE — ED Provider Notes (Signed)
Leslie Martinez    CSN: [id number] Arrival date & time: 10/14/21  1851      History   Chief Complaint Chief Complaint  Patient presents with   Chest Pain   Wheezing    HPI ALARIA OCONNOR is a 56 y.o. female.    Chest Pain Wheezing Associated symptoms: chest pain    Here for 3-day history of cough, rhinorrhea, and congestion.  She is also felt short of breath and feels some heaviness or pain in her chest when she breathes deep.  She feels like she might have fever but has not measured 1.  No vomiting or diarrhea or sore throat.  No ear pain  She has had COVID several times.  And since the last time she has had several episodes of symptoms that are consistent with a URI, and symptoms will need an inhaler  Had a video visit done on July 10, and was prescribed albuterol, Symbicort, and doxycycline.  She continues to have symptoms, so comes in today.  Past Medical History:  Diagnosis Date   Anxiety    Asthma    seasonal allergies, dry cough today    Barrett esophagus 01/2011   Cervical cancer ([place])    "stage III; discovered S/P hysterectomy" (05/12/2017)   Chronic bronchitis (HCC)    Depression    Esophageal stricture    Esophagitis    Gastritis    GERD (gastroesophageal reflux disease)    Hiatal hernia    history of, treated with surgery   History of blood transfusion 1990s   "related to childbirth"   History of kidney stones    passed stones spontaneously, also cystoscopy- done    Hypertension    Migraine    "monthly" (05/12/2017)   Osteoarthritis of both knees    Pancreatitis    Pneumonia 2000s X1   Pre-diabetes    Prinzmetal variant angina (HCC)    1990's   Seasonal allergies    Sleep apnea    Had nasal sx, and uses no cpap    Patient Active Problem List   Diagnosis Date Noted   Fatigue 05/08/2020   History of bilateral knee replacement 05/08/2020   Sleep disturbance 08/28/2019   Pneumonia due to COVID-19 virus 08/20/2019   Adjustment  disorder with mixed anxiety and depressed mood 06/08/2018   Diarrhea 06/08/2018   Encounter for screening for malignant neoplasm of breast 06/08/2018   Allergic conjunctivitis of left eye 04/16/2018   Cholecystitis with cholelithiasis 07/20/2017   Acute cholecystitis    Obesity, diabetes, and hypertension syndrome ([place])    History of Prinzmetal angina    Dysphagia    Type 2 diabetes mellitus without complication ([place]) 77/82/4235   Loosening of knee joint prosthesis ([place]) 05/11/2017   Depression 07/23/2015   Anxiety state 07/23/2015   Primary osteoarthritis of knee 07/13/2015   Primary osteoarthritis of left knee 07/11/2015   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   Lap Nissen Nov 2013 02/06/2012   Essential hypertension 01/09/2008   PRINZMETAL'S ANGINA 01/09/2008   Asthma, chronic 01/09/2008   BARRETT'S ESOPHAGUS, HX OF 01/09/2008   NEPHROLITHIASIS, HX OF 01/09/2008   DYSPHAGIA UNSPECIFIED 11/26/2007    Past Surgical History:  Procedure Laterality Date   APPENDECTOMY     CARDIAC CATHETERIZATION  1990's X 1   CHOLECYSTECTOMY N/A 07/20/2017   Procedure: LAPAROSCOPIC CHOLECYSTECTOMY WITH INTRAOPERATIVE CHOLANGIOGRAM;  Surgeon: Excell Seltzer, MD;  Location: [facility];  Service: General;  Laterality: N/A;   COLONOSCOPY     CYSTOSCOPY W/  STONE MANIPULATION     DILATION AND CURETTAGE OF UTERUS     ENDOMETRIAL ABLATION     ESOPHAGOGASTRODUODENOSCOPY (EGD) WITH ESOPHAGEAL DILATION  "several times"   HERNIA REPAIR     JOINT REPLACEMENT     KIDNEY POLYPS REMOVED     KNEE ARTHROSCOPY Bilateral    LAPAROSCOPIC NISSEN FUNDOPLICATION  24/08/8097   Procedure: LAPAROSCOPIC NISSEN FUNDOPLICATION;  Surgeon: Pedro Earls, MD;  Location: [facility];  Service: General;  Laterality: N/A;   NASAL SEPTUM SURGERY  2000s   "w/adenoids and hyoid bone removed"   REVISION TOTAL KNEE ARTHROPLASTY Left 05/12/2017   TONSILLECTOMY  2004   T&A- due to sleep apnea    TOTAL KNEE ARTHROPLASTY Left 07/13/2015    Procedure: TOTAL KNEE ARTHROPLASTY;  Surgeon: Frederik Pear, MD;  Location: [facility];  Service: Orthopedics;  Laterality: Left;   TOTAL KNEE REVISION Left 05/12/2017   Procedure: TOTAL KNEE REVISION;  Surgeon: Frederik Pear, MD;  Location: [facility];  Service: Orthopedics;  Laterality: Left;   TUBAL LIGATION     UPPER GASTROINTESTINAL ENDOSCOPY     UPPER GI ENDOSCOPY  02/03/2012   Procedure: UPPER GI ENDOSCOPY;  Surgeon: Pedro Earls, MD;  Location: [facility];  Service: General;;   VAGINAL HYSTERECTOMY     WISDOM TOOTH EXTRACTION      OB History   No obstetric history on file.      Home Medications    Prior to Admission medications   Medication Sig Start Date End Date Taking? Authorizing Provider  amoxicillin-clavulanate (AUGMENTIN) 875-125 MG tablet Take 1 tablet by mouth 2 (two) times daily for 7 days. 10/14/21 10/21/21 Yes Amorie Rentz, Gwenlyn Perking, MD  predniSONE (DELTASONE) 20 MG tablet Take 2 tablets (40 mg total) by mouth daily with breakfast for 5 days. 10/14/21 10/19/21 Yes Sharlie Shreffler, Gwenlyn Perking, MD  albuterol (VENTOLIN HFA) 108 (90 Base) MCG/ACT inhaler Inhale 2 puffs into the lungs every 4 (four) hours as needed for wheezing or shortness of breath. 08/21/19   Kathrene Alu, MD  albuterol (VENTOLIN HFA) 108 (90 Base) MCG/ACT inhaler Inhale 2 puffs into the lungs every 6 (six) hours as needed for wheezing or shortness of breath. 10/11/21   Apolonio Schneiders, FNP  budesonide-formoterol (SYMBICORT) 80-4.5 MCG/ACT inhaler Inhale 2 puffs into the lungs 2 (two) times daily. 05/08/20   Meccariello, Bernita Raisin, DO  busPIRone (BUSPAR) 5 MG tablet TAKE 1 TABLET BY MOUTH THREE TIMES A DAY 06/01/20   Meccariello, Bernita Raisin, DO  cetirizine (ZYRTEC) 10 MG tablet Take 10 mg by mouth daily as needed for allergies.    [provider]  cholestyramine light (PREVALITE) 4 g packet TAKE 1/2 PACKET MIXED WITH FLUIDS TWICE DAILY. Patient needs to be seen in the office prior to receiving any additional refills. 03/05/21    Alcus Dad, MD  doxycycline (VIBRA-TABS) 100 MG tablet Take 1 tablet (100 mg total) by mouth 2 (two) times daily for 10 days. 10/11/21 10/21/21  Apolonio Schneiders, FNP  fluticasone (FLONASE) 50 MCG/ACT nasal spray Place 2 sprays into both nostrils daily. 11/09/20   Muthersbaugh, Jarrett Soho, PA-C  Melatonin 5 MG TABS Take 5 mg by mouth at bedtime.    [provider]  nirmatrelvir/ritonavir EUA (PAXLOVID) TABS Take 3 tablets by mouth twice daily for 5 days. 11/09/20   Muthersbaugh, Jarrett Soho, PA-C  olmesartan-hydrochlorothiazide (BENICAR HCT) 40-25 MG tablet Take 1 tablet by mouth daily. 05/11/20   Meccariello, Bernita Raisin, DO  omeprazole (PRILOSEC) 40 MG capsule Take 1  capsule (40 mg total) by mouth daily. 08/15/16   Mauri Pole, MD  ondansetron (ZOFRAN ODT) 4 MG disintegrating tablet Dissolve 1 tablet by mouth every 4 hours as needed for nausea/vomit 11/09/20   Muthersbaugh, Jarrett Soho, PA-C  sertraline (ZOLOFT) 100 MG tablet Take 1.5 tablets (150 mg total) by mouth daily. 06/08/18   Meccariello, Bernita Raisin, DO  colestipol (COLESTID) 1 g tablet Take 1 tablet (1 g total) by mouth 2 (two) times daily. Patient not taking: Reported on 08/20/2019 11/01/18 08/20/19  Mauri Pole, MD    Family History Family History  Problem Relation Age of Onset   Heart disease Mother    Heart disease Father    Diabetes Father    Colon cancer Maternal Grandmother    Colon polyps Neg Hx    Esophageal cancer Neg Hx    Rectal cancer Neg Hx    Stomach cancer Neg Hx     Social History Social History   Tobacco Use   Smoking status: Never   Smokeless tobacco: Never  Vaping Use   Vaping Use: Never used  Substance Use Topics   Alcohol use: No   Drug use: No     Allergies   Adhesive [tape], Iodine, Lisinopril, Cephalexin, Compazine [prochlorperazine edisylate], Latex, and Oxycodone-acetaminophen   Review of Systems Review of Systems  Respiratory:  Positive for wheezing.   Cardiovascular:  Positive for chest  pain.     Physical Exam Triage Vital Signs ED Triage Vitals  Enc Vitals Group     BP 10/14/21 1904 (!) 155/87     Pulse Rate 10/14/21 1904 89     Resp 10/14/21 1904 12     Temp 10/14/21 1904 98.8 F (37.1 C)     Temp src --      SpO2 10/14/21 1904 95 %     Weight --      Height --      Head Circumference --      Peak Flow --      Pain Score 10/14/21 1909 4     Pain Loc --      Pain Edu? --      Excl. in [place]? --    No data found.  Updated Vital Signs BP (!) 155/87   Pulse 89   Temp 98.8 F (37.1 C)   Resp 12   SpO2 95%   Visual Acuity Right Eye Distance:   Left Eye Distance:   Bilateral Distance:    Right Eye Near:   Left Eye Near:    Bilateral Near:     Physical Exam Vitals reviewed.  Constitutional:      General: She is not in acute distress.    Appearance: She is not toxic-appearing.  HENT:     Nose: Nose normal.     Mouth/Throat:     Mouth: Mucous membranes are moist.     Pharynx: No oropharyngeal exudate or posterior oropharyngeal erythema.  Eyes:     Extraocular Movements: Extraocular movements intact.     Conjunctiva/sclera: Conjunctivae normal.     Pupils: Pupils are equal, round, and reactive to light.  Cardiovascular:     Rate and Rhythm: Normal rate and regular rhythm.     Heart sounds: No murmur heard. Pulmonary:     Effort: Pulmonary effort is normal. No respiratory distress.     Breath sounds: No stridor. No rhonchi or rales.     Comments: During the lung exam she is fairly clear, but then wheezes  when she coughs. Chest:     Chest wall: No tenderness.  Musculoskeletal:     Cervical back: Neck supple.  Lymphadenopathy:     Cervical: No cervical adenopathy.  Skin:    Capillary Refill: Capillary refill takes less than 2 seconds.     Coloration: Skin is not jaundiced or pale.  Neurological:     General: No focal deficit present.     Mental Status: She is alert and oriented to person, place, and time.  Psychiatric:        Behavior:  Behavior normal.      UC Treatments / Results  Labs (all labs ordered are listed, but only abnormal results are displayed) Labs Reviewed  NOVEL CORONAVIRUS, NAA    EKG   Radiology DG Chest 2 View  Result Date: 10/14/2021 CLINICAL DATA:  Cough wheezing EXAM: CHEST - 2 VIEW COMPARISON:  09/10/2021 FINDINGS: No consolidation or effusion. Vague slightly nodular opacity in the left mid lung with additional patchy opacity at the left base. Normal cardiac size. No pneumothorax IMPRESSION: Vague opacities in the left mid and lower lung possibly due to pneumonia but midlung opacities somewhat nodular in configuration. Radiographic follow-up in 4-6 weeks is recommended to ensure clearing and exclude nodule Electronically Signed   By: Donavan Foil M.D.   On: 10/14/2021 19:49    Procedures Procedures (including critical Martinez time)  Medications Ordered in UC Medications - No data to display  Initial Impression / Assessment and Plan / UC Course  I have reviewed the triage vital signs and the nursing notes.  Pertinent labs & imaging results that were available during my Martinez of the patient were reviewed by me and considered in my medical decision making (see chart for details).     There are opacities on the x-ray that could indicate pneumonia.  However it is somewhat nodular and she needs chest x-ray follow-up in about a month.  I am going to add Augmentin to her regimen and prednisone for possible asthma exacerbation Final Clinical Impressions(s) / UC Diagnoses   Final diagnoses:  Acute cough  Pneumonia due to infectious organism, unspecified laterality, unspecified part of lung  Moderate persistent asthma with acute exacerbation     Discharge Instructions      There is probable pneumonia on your x-ray.  The radiologist recommends you are having a follow-up chest x-ray to make sure it is resolved in about a month.  Continue your doxycycline, albuterol, and Symbicort that you  have been prescribed by your primary Martinez  Take amoxicillin-clavulanate 875 mg--1 tab twice daily with food for 7 days  Prednisone 20 mg-take 2 daily for 5 days this is for inflammation in your lungs;-     ED Prescriptions     Medication Sig Dispense Auth. Provider   amoxicillin-clavulanate (AUGMENTIN) 875-125 MG tablet Take 1 tablet by mouth 2 (two) times daily for 7 days. 14 tablet Keandra Medero, Gwenlyn Perking, MD   predniSONE (DELTASONE) 20 MG tablet Take 2 tablets (40 mg total) by mouth daily with breakfast for 5 days. 10 tablet Windy Carina Gwenlyn Perking, MD      PDMP not reviewed this encounter.   Barrett Henle, MD 10/14/21 2007

## 2021-10-14 NOTE — Discharge Instructions (Addendum)
There is probable pneumonia on your x-ray.  The radiologist recommends you are having a follow-up chest x-ray to make sure it is resolved in about a month.  Continue your doxycycline, albuterol, and Symbicort that you have been prescribed by your primary care  Take amoxicillin-clavulanate 875 mg--1 tab twice daily with food for 7 days  Prednisone 20 mg-take 2 daily for 5 days this is for inflammation in your lungs;-

## 2021-10-16 LAB — NOVEL CORONAVIRUS, NAA: SARS-CoV-2, NAA: NOT DETECTED

## 2021-11-02 ENCOUNTER — Ambulatory Visit: Payer: No Typology Code available for payment source | Admitting: Gastroenterology

## 2022-01-28 ENCOUNTER — Other Ambulatory Visit (HOSPITAL_COMMUNITY): Payer: Self-pay | Admitting: Physician Assistant

## 2022-02-18 ENCOUNTER — Encounter: Payer: No Typology Code available for payment source | Admitting: Family Medicine

## 2022-02-21 ENCOUNTER — Other Ambulatory Visit (HOSPITAL_COMMUNITY): Payer: Self-pay

## 2022-03-13 NOTE — Progress Notes (Unsigned)
    SUBJECTIVE:   Chief complaint/HPI: annual examination  Leslie Martinez is a 56 y.o. who presents today for an annual exam.   History tabs reviewed and updated ***.   Review of systems form reviewed and notable for ***.   OBJECTIVE:   There were no vitals taken for this visit.  ***  ASSESSMENT/PLAN:   No problem-specific Assessment & Plan notes found for this encounter. HTN -previously on olmesartan  DM -diet controlled -not on statin or ACE/ARB  Annual Examination  See AVS for age appropriate recommendations  PHQ score ***, reviewed and discussed.  BP reviewed and at goal ***.  Asked about intimate partner violence and resources given as appropriate  Advance directives discussion ***  Considered the following items based upon USPSTF recommendations: Diabetes screening: {discussed/ordered:14545} Screening for elevated cholesterol: {discussed/ordered:14545} HIV testing:  normal in 2019- repeat not indicated Hepatitis C:  normal in 2019- repeat not indicated Syphilis if at high risk:  not at high risk GC/CT {GC/CT screening :23818} Osteoporosis screening considered based upon risk of fracture from [facility] calculator. Major osteoporotic fracture risk is ***%. DEXA {ordered not order:23822}.   Discussed family history, BRCA testing {not indicated/requested/declined:14582}. Tool used to risk stratify was ***.  Cervical cancer screening: {PAPTYPE:23819} Breast cancer screening:  UTD on mammogram Colorectal cancer screening: up to date on screening for CRC. Lung cancer screening: {discussed/declined/written BEML:54492}. See documentation below regarding indications/risks/benefits.  Vaccinations ***.   Follow up in 1 *** year or sooner if indicated.    Alcus Dad, MD [place]

## 2022-03-14 ENCOUNTER — Ambulatory Visit (INDEPENDENT_AMBULATORY_CARE_PROVIDER_SITE_OTHER): Payer: BC Managed Care – PPO | Admitting: Family Medicine

## 2022-03-14 ENCOUNTER — Encounter: Payer: Self-pay | Admitting: Family Medicine

## 2022-03-14 VITALS — BP 172/89 | HR 61 | Ht 69.0 in | Wt 256.8 lb

## 2022-03-14 DIAGNOSIS — Z0000 Encounter for general adult medical examination without abnormal findings: Secondary | ICD-10-CM | POA: Diagnosis not present

## 2022-03-14 DIAGNOSIS — E119 Type 2 diabetes mellitus without complications: Secondary | ICD-10-CM | POA: Diagnosis not present

## 2022-03-14 DIAGNOSIS — I10 Essential (primary) hypertension: Secondary | ICD-10-CM | POA: Diagnosis not present

## 2022-03-14 DIAGNOSIS — R319 Hematuria, unspecified: Secondary | ICD-10-CM | POA: Diagnosis not present

## 2022-03-14 DIAGNOSIS — Z124 Encounter for screening for malignant neoplasm of cervix: Secondary | ICD-10-CM

## 2022-03-14 DIAGNOSIS — Z789 Other specified health status: Secondary | ICD-10-CM

## 2022-03-14 LAB — POCT GLYCOSYLATED HEMOGLOBIN (HGB A1C): HbA1c, POC (controlled diabetic range): 6.7 % (ref 0.0–7.0)

## 2022-03-14 LAB — POCT UA - MICROSCOPIC ONLY
Bacteria, U Microscopic: NONE SEEN
WBC, Ur, HPF, POC: NONE SEEN (ref 0–5)

## 2022-03-14 MED ORDER — FLUTICASONE PROPIONATE 50 MCG/ACT NA SUSP: 2.0000 | Freq: Every day | NASAL | 0 refills | Status: DC

## 2022-03-14 MED ORDER — OLMESARTAN MEDOXOMIL-HCTZ 40-25 MG PO TABS: 1.0000 | ORAL_TABLET | Freq: Every day | ORAL | 0 refills | Status: DC

## 2022-03-14 MED ORDER — CHOLESTYRAMINE LIGHT 4 G PO PACK: PACK | ORAL | 0 refills | Status: DC

## 2022-03-14 MED ORDER — SEMAGLUTIDE(0.25 OR 0.5MG/DOS) 2 MG/1.5ML ~~LOC~~ SOPN: PEN_INJECTOR | SUBCUTANEOUS | 3 refills | Status: DC

## 2022-03-14 NOTE — Patient Instructions (Addendum)
It was great to see you!  Things we discussed at today's visit: -It is very important to take your blood pressure medication every day.  This helps reduce your risk for heart disease, stroke, kidney damage, and other serious health problems. -We will start a medication called Ozempic for your diabetes and weight (once weekly injection). It may take several weeks for this to be approved by insurance. -We are checking some labs and urine studies. I will send you a MyChart message with the results or call if needed. We may need to try a different statin medication. - Because you have diabetes, you need a diabetic eye exam every year.  Please see an eye doctor asap and have them fax the notes to our office. -Please figure out which urologist you saw and see if they can fax records to our office. -I sent med refills to your pharmacy  Things to do to keep yourself healthy  - Exercise at least 30-45 minutes a day, 3-4 days a week.  - Eat a diet with 5 servings of fruits and vegetables per day. - Avoid sugar sweetened beverages and processed foods whenever possible. - Seatbelts can save your life. Wear them always.  - Safe sex - use a condom to prevent STIs. - Alcohol -  If you drink, do it moderately, less than 1 drink per day. - Sleep - aim for 7-8 hours nightly. - Limit screen time to no more than 2 hours per day.  - [place]. Choose someone to make decisions for you if you are not able--complete the blue advanced directives packet and return to our office at your earliest convenience - Depression is common in our stressful world. If you're feeling down or losing interest in things you normally enjoy, please come in for a visit.  - Violence - If anyone is threatening or hurting you, please call immediately.   Take care and seek immediate care sooner if you develop any concerns.  Dr[facility]

## 2022-03-15 ENCOUNTER — Encounter: Payer: Self-pay | Admitting: Family Medicine

## 2022-03-15 DIAGNOSIS — R319 Hematuria, unspecified: Secondary | ICD-10-CM | POA: Insufficient documentation

## 2022-03-15 DIAGNOSIS — Z789 Other specified health status: Secondary | ICD-10-CM | POA: Insufficient documentation

## 2022-03-15 LAB — BASIC METABOLIC PANEL
BUN/Creatinine Ratio: 14 (ref 9–23)
BUN: 10 mg/dL (ref 6–24)
CO2: 25 mmol/L (ref 20–29)
Calcium: 9.4 mg/dL (ref 8.7–10.2)
Chloride: 102 mmol/L (ref 96–106)
Creatinine, Ser: 0.74 mg/dL (ref 0.57–1.00)
Glucose: 98 mg/dL (ref 70–99)
Potassium: 3.9 mmol/L (ref 3.5–5.2)
Sodium: 141 mmol/L (ref 134–144)
eGFR: 94 mL/min/{1.73_m2} (ref >59)

## 2022-03-15 LAB — LIPID PANEL
Chol/HDL Ratio: 3.7 ratio (ref 0.0–4.4)
Cholesterol, Total: 201 mg/dL — ABNORMAL HIGH (ref 100–199)
HDL: 54 mg/dL (ref >39)
LDL Chol Calc (NIH): 127 mg/dL — ABNORMAL HIGH (ref 0–99)
Triglycerides: 114 mg/dL (ref 0–149)
VLDL Cholesterol Cal: 20 mg/dL (ref 5–40)

## 2022-03-15 LAB — MICROALBUMIN / CREATININE URINE RATIO
Creatinine, Urine: 98.9 mg/dL
Microalb/Creat Ratio: 6 mg/g creat (ref 0–29)
Microalbumin, Urine: 5.8 ug/mL

## 2022-03-15 NOTE — Assessment & Plan Note (Addendum)
Has always been well-controlled with diet alone. A1c 6.7% today. -Start GLP-1 for additional glycemic control and weight benefit. Rx sent for semaglutide 0.'25mg'$  weekly -Foot exam wnl today -Check BMP, urine microalbumin -Encouraged patient to have diabetic eye exam -on ARB -Not on statin due to prior intolerance: consider re-trial in the future; check lipid panel today

## 2022-03-15 NOTE — Assessment & Plan Note (Signed)
Patient with microscopic hematuria on multiple prior UAs. Check urine micro today. States she saw urology in the past (unsure when/who)- she will try to locate these records. May need new referral.

## 2022-03-15 NOTE — Assessment & Plan Note (Signed)
Not well-controlled due to suboptimal medication adherence.  Emphasized importance of taking her Benicar daily, especially in light of her multiple risk factors for heart disease/stroke (HTN, DM, obesity, family history). Check BMP today

## 2022-03-17 ENCOUNTER — Other Ambulatory Visit (HOSPITAL_COMMUNITY): Payer: Self-pay

## 2022-03-17 ENCOUNTER — Telehealth: Payer: Self-pay

## 2022-03-17 NOTE — Telephone Encounter (Signed)
A Prior Authorization was initiated for this patients ozempic through CoverMyMeds.   Key: [id number]

## 2022-03-18 ENCOUNTER — Other Ambulatory Visit (HOSPITAL_COMMUNITY): Payer: Self-pay

## 2022-03-18 NOTE — Telephone Encounter (Signed)
Prior Auth for patients medication OZEMPIC approved by [facility] from 03/17/22 to 03/17/25.  Key: [id number]

## 2022-03-28 ENCOUNTER — Other Ambulatory Visit: Payer: Self-pay | Admitting: Family Medicine

## 2022-04-16 ENCOUNTER — Other Ambulatory Visit: Payer: Self-pay | Admitting: Family Medicine

## 2022-04-22 ENCOUNTER — Ambulatory Visit: Payer: No Typology Code available for payment source | Admitting: Family Medicine

## 2022-04-26 ENCOUNTER — Ambulatory Visit (INDEPENDENT_AMBULATORY_CARE_PROVIDER_SITE_OTHER): Payer: BC Managed Care – PPO | Admitting: Family Medicine

## 2022-04-26 ENCOUNTER — Encounter: Payer: Self-pay | Admitting: Family Medicine

## 2022-04-26 VITALS — BP 152/94 | HR 67 | Ht 69.0 in | Wt 247.6 lb

## 2022-04-26 DIAGNOSIS — E785 Hyperlipidemia, unspecified: Secondary | ICD-10-CM | POA: Diagnosis not present

## 2022-04-26 DIAGNOSIS — I10 Essential (primary) hypertension: Secondary | ICD-10-CM | POA: Diagnosis not present

## 2022-04-26 MED ORDER — ATORVASTATIN CALCIUM 10 MG PO TABS: 10.0000 mg | ORAL_TABLET | Freq: Every day | ORAL | 0 refills | Status: DC

## 2022-04-26 NOTE — Assessment & Plan Note (Signed)
LDL 127 on lipid panel last month. The 10-year ASCVD risk score (Arnett DK, et al., 2019) is: 22.1% and she has diabetes. Was intolerant of simvastatin in the past. -Trial of atorvastatin, will start at '10mg'$  to reduce risk of myalgias with every other day dosing. -Follow up with pharmacy team in 1 month

## 2022-04-26 NOTE — Progress Notes (Signed)
    SUBJECTIVE:   CHIEF COMPLAINT / HPI:   Hypertension: Patient is a 57 y.o. female who presents today for HTN follow-up.  Last seen 03/14/22, BP was elevated to 172/89 at that visit-- she was only taking her BP med a few times per week. Emphasized importance of daily adherence and advised she monitor BP at home. She returns today for follow up.  Home medications include: olmesartan-HCTZ 40-'25mg'$  daily.  Patient reports improved compliance. Misses 1 or 2 doses per week. Did not take meds last night.  Patient does check blood pressure at home- states her readings are typically 140s/80s.  Patient has had a BMP in the past 1 year.  Hyperlipidemia: Lipid panel was checked last month and showed LDL 127, total cholesterol 201. ASCVD risk 30% at that time. Patient has a history of diabetes and should be on statin anyway. However she was intolerant of simvastatin in the past-- had severe myalgias. She is willing to try alternate statin.  PERTINENT  PMH / PSH: Barrett's esophagus  OBJECTIVE:   BP (!) 152/94   Pulse 67   Ht '5\' 9"'$  (1.753 m)   Wt 247 lb 9.6 oz (112.3 kg)   SpO2 99%   BMI 36.56 kg/m   Gen: NAD, pleasant, able to participate in exam CV: RRR, normal S1/S2, no murmur Resp: Normal effort, lungs CTAB Extremities: no edema or cyanosis Skin: warm and dry, no rashes noted Neuro: alert, no obvious focal deficits Psych: Normal affect and mood  ASSESSMENT/PLAN:   Essential hypertension BP remains above goal, likely due to suboptimal medication adherence. Will not make any changes today. Continue Benicar 40-'25mg'$  daily. Emphasized importance of daily dosing, discussed strategies to improve compliance. Continue monitoring home BP readings, bring written log to next appt. Follow-up with pharmacy team in 1 month.  Hyperlipidemia LDL 127 on lipid panel last month. The 10-year ASCVD risk score (Arnett DK, et al., 2019) is: 22.1% and she has diabetes. Was intolerant of simvastatin in  the past. -Trial of atorvastatin, will start at '10mg'$  to reduce risk of myalgias with every other day dosing. -Follow up with pharmacy team in 1 month    Alcus Dad, MD [place]

## 2022-04-26 NOTE — Patient Instructions (Addendum)
It was great to see you!  Things we discussed today: -Congratulations on the 10 pound weight loss!  -Your blood pressure is still high.  We will not make any changes to your BP medications today since you forgot your dose last night.  -Please continue to check your blood pressure daily at home.  Bring a WRITTEN log of the numbers to your next visit. -I would like you to see our pharmacy team to further discuss your blood pressure and cholesterol. Schedule an appointment for 1 month from now on your way out today. -We will try a different statin-- atorvastatin, also called lipitor. We will do the lowest dose to reduce chance of muscle aches. You can also try taking it every other day to reduce chance of side effects.   Take care, Dr Rock Nephew

## 2022-04-26 NOTE — Assessment & Plan Note (Addendum)
BP remains above goal, likely due to suboptimal medication adherence. Will not make any changes today. Continue Benicar 40-'25mg'$  daily. Emphasized importance of daily dosing, discussed strategies to improve compliance. Continue monitoring home BP readings, bring written log to next appt. Follow-up with pharmacy team in 1 month.

## 2022-06-21 ENCOUNTER — Encounter: Payer: Self-pay | Admitting: Nurse Practitioner

## 2022-08-06 ENCOUNTER — Other Ambulatory Visit: Payer: Self-pay | Admitting: Family Medicine

## 2022-08-21 ENCOUNTER — Other Ambulatory Visit: Payer: Self-pay | Admitting: Family Medicine

## 2022-12-20 ENCOUNTER — Telehealth: Payer: Self-pay | Admitting: Pharmacist

## 2022-12-20 NOTE — Telephone Encounter (Signed)
Contacted patient for follow-up of blood pressure/hypertension control.   Patient was not interested in scheduling an appointment for BP check.  Total time with patient call and documentation of interaction: 4 minutes.

## 2024-01-15 ENCOUNTER — Encounter: Payer: Self-pay | Admitting: Family Medicine

## 2024-01-15 ENCOUNTER — Ambulatory Visit (INDEPENDENT_AMBULATORY_CARE_PROVIDER_SITE_OTHER): Admitting: Family Medicine

## 2024-01-15 VITALS — BP 160/87 | HR 62 | Ht 69.0 in | Wt 241.6 lb

## 2024-01-15 DIAGNOSIS — I10 Essential (primary) hypertension: Secondary | ICD-10-CM

## 2024-01-15 DIAGNOSIS — Z0000 Encounter for general adult medical examination without abnormal findings: Secondary | ICD-10-CM

## 2024-01-15 DIAGNOSIS — Z9889 Other specified postprocedural states: Secondary | ICD-10-CM

## 2024-01-15 DIAGNOSIS — E782 Mixed hyperlipidemia: Secondary | ICD-10-CM

## 2024-01-15 DIAGNOSIS — J4540 Moderate persistent asthma, uncomplicated: Secondary | ICD-10-CM

## 2024-01-15 DIAGNOSIS — E119 Type 2 diabetes mellitus without complications: Secondary | ICD-10-CM | POA: Diagnosis not present

## 2024-01-15 DIAGNOSIS — K22719 Barrett's esophagus with dysplasia, unspecified: Secondary | ICD-10-CM

## 2024-01-15 LAB — POCT GLYCOSYLATED HEMOGLOBIN (HGB A1C): HbA1c, POC (controlled diabetic range): 6.5 % (ref 0.0–7.0)

## 2024-01-15 MED ORDER — BUDESONIDE-FORMOTEROL FUMARATE 80-4.5 MCG/ACT IN AERO: 2.0000 | INHALATION_SPRAY | Freq: Two times a day (BID) | RESPIRATORY_TRACT | 3 refills | Status: AC

## 2024-01-15 MED ORDER — ATORVASTATIN CALCIUM 10 MG PO TABS: 10.0000 mg | ORAL_TABLET | Freq: Every day | ORAL | 0 refills | Status: DC

## 2024-01-15 MED ORDER — SEMAGLUTIDE(0.25 OR 0.5MG/DOS) 2 MG/1.5ML ~~LOC~~ SOPN: 0.2500 mg | PEN_INJECTOR | SUBCUTANEOUS | 1 refills | Status: DC

## 2024-01-15 MED ORDER — OLMESARTAN MEDOXOMIL-HCTZ 40-25 MG PO TABS: 1.0000 | ORAL_TABLET | Freq: Every day | ORAL | 0 refills | Status: DC

## 2024-01-15 MED ORDER — PREVALITE 4 G PO PACK: 4.0000 g | PACK | Freq: Two times a day (BID) | ORAL | 2 refills | Status: AC

## 2024-01-15 NOTE — Patient Instructions (Addendum)
 It was great to see you today! Thank you for choosing [facility] for your primary care.  Today we addressed: Type 2 diabetes mellitus Your A1c is 6.5 today, which is below your goal.  This is great!  I am restarting your Ozempic  at the lowest dose again, please call us  if you are having any side effects.  You can also call us  after 1 month to increase the dose to 0.5 mg.  Mixed hyperlipidemia Please try taking the atorvastatin  to see if you can tolerate it this time.  Barrett's esophagus Please keep taking your omeprazole .  Essential hypertension I am refilling your Benicar  today.  Please keep taking it daily. Please keep a chart of your blood pressures and return in 1 month to discuss more.  We are checking some labs today, including a BMP.  You will get a MyChart message or a letter if results are normal. Otherwise, you will get a call from us .  You should return to our clinic in 1 month.  Thank you for coming to see us  at [facility] and for the opportunity to care for you! Toma, [place] Monds, MD 01/15/2024, 4:37 PM

## 2024-01-15 NOTE — Progress Notes (Signed)
 SUBJECTIVE:   CHIEF COMPLAINT / HPI:  Leslie Martinez is a 58 y.o. female establishing care after over 1.5 years with a pertinent past medical history of T2DM, HLD, HTN, Barrett's esophagus presenting to the clinic for medication refills and chronic disease follow up.  Patient last seen at [facility] in January 2024. Reports she has not taken some of her medicines since she ran out several months ago.  She has been taking some of her medicines because she had many remaining.  Type 2 diabetes mellitus - Last A1c 6.7 in December 2023 - Home CBGs: Not checking - Medications: Was previously taking semaglutide  0.5 mg weekly, has not taken in about half a year - Adherence: Minimal - Eye exam: Due - Foot exam: Due - Microalbumin: Due - Statin: Atorvastatin  10 mg, has not taken as above - No symptoms of hypoglycemia, polyuria, polydipsia, numbness of extremities, foot ulcers/trauma  Hypertension - BP: (!) 160/87 today. - Home medications include: Olmesartan -hydrochlorothiazide  40-25 mg daily. - Takes at night, does miss some doses - Does not check blood pressure at home. - Denies hypotension symptoms including dizziness and orthostatic symptoms. - Denies headache, vision changes, LUQ pain, hematuria, chest pain.  Hx abdominal surgery Patient feels her Nissen is coming loose. She also needs to get her esophagus dilated for strictures. Following with GI closely for this. Plans to make an appointment soon.  PERTINENT PMH / PSH: Barrett's esophagus on omeprazole  T2DM, HLD, HTN, obesity Asthma  *Remainder reviewed in problem list.   OBJECTIVE:   BP (!) 160/87   Pulse 62   Ht 5' 9 (1.753 m)   Wt 241 lb 9.6 oz (109.6 kg)   SpO2 100%   BMI 35.68 kg/m   General: Age-appropriate, resting comfortably in chair, NAD, alert and at baseline. Cardiovascular: Regular rate and rhythm. Normal S1/S2. No murmurs, rubs, or gallops appreciated. 2+ radial pulses. Pulmonary: Clear bilaterally to  ascultation. No wheezes, crackles, or rhonchi. Normal WOB on room air. No accessory muscle use. Skin: No rashes grossly. Extremities: No peripheral edema bilaterally. Capillary refill <2 seconds.  Diabetic Foot Exam - Simple   No data filed     Results for orders placed or performed in visit on 01/15/24 (from the past 48 hours)  POCT glycosylated hemoglobin (Hb A1C)     Status: None   Collection Time: 01/15/24  4:04 PM  Result Value Ref Range   Hemoglobin A1C     HbA1c POC (<> result, manual entry)     HbA1c, POC (prediabetic range)     HbA1c, POC (controlled diabetic range) 6.5 0.0 - 7.0 %    ASSESSMENT/PLAN:   Assessment & Plan Essential hypertension Poorly controlled today.  Previously reports poor tolerance of amlodipine .  Consider adding spironolactone as next step. - Refilled patient's Diovan, same dose - Return in 1 month for follow-up - BMP today Type 2 diabetes mellitus without complication, without long-term current use of insulin  (HCC) Mixed hyperlipidemia A1c 6.5, adequate control today. - Restart semaglutide  at lowest dose 0.25 mg weekly - Counseling on side effects provided - Call back in 1 month to consider increasing dose - Refilled patient's atorvastatin  10 mg today, patient will trial the medicine but states she will likely discontinue it if she has myalgias - BMP and microalbumin/creatinine ratio today Healthcare maintenance - Influenza and PCV 20 vaccines deferred today, discussed at next visit in 1 month - Follow-up mammogram and colonoscopy in 1 month Barrett's esophagus with dysplasia - Continue omeprazole  Lap  Nissen Nov 2013 - Follow-up with GI/surgery - Cholestyramine  packets refilled Moderate persistent chronic asthma without complication No recent dyspnea, severe symptoms. - Refilled Symbicort   Follow-up in 1 month.  Leslie Merriman Toma, MD [facility]

## 2024-01-15 NOTE — Assessment & Plan Note (Signed)
 No recent dyspnea, severe symptoms. - Refilled Symbicort 

## 2024-01-15 NOTE — Assessment & Plan Note (Signed)
 A1c 6.5, adequate control today. - Restart semaglutide  at lowest dose 0.25 mg weekly - Counseling on side effects provided - Call back in 1 month to consider increasing dose - Refilled patient's atorvastatin  10 mg today, patient will trial the medicine but states she will likely discontinue it if she has myalgias - BMP and microalbumin/creatinine ratio today

## 2024-01-15 NOTE — Assessment & Plan Note (Addendum)
 A1c 6.5, adequate control today. - Restart semaglutide  at lowest dose 0.25 mg weekly - Counseling on side effects provided - Call back in 1 month to consider increasing dose - Refilled patient's atorvastatin  10 mg today, patient will trial the medicine but states she will likely discontinue it if she has myalgias - BMP and microalbumin/creatinine ratio today

## 2024-01-15 NOTE — Assessment & Plan Note (Signed)
 Continue omeprazole

## 2024-01-15 NOTE — Assessment & Plan Note (Signed)
 Poorly controlled today.  Previously reports poor tolerance of amlodipine .  Consider adding spironolactone as next step. - Refilled patient's Diovan, same dose - Return in 1 month for follow-up - BMP today

## 2024-01-15 NOTE — Assessment & Plan Note (Signed)
-   Follow-up with GI/surgery - Cholestyramine  packets refilled

## 2024-01-16 LAB — BASIC METABOLIC PANEL WITH GFR
BUN/Creatinine Ratio: 15 (ref 9–23)
BUN: 11 mg/dL (ref 6–24)
CO2: 25 mmol/L (ref 20–29)
Calcium: 9.4 mg/dL (ref 8.7–10.2)
Chloride: 103 mmol/L (ref 96–106)
Creatinine, Ser: 0.71 mg/dL (ref 0.57–1.00)
Glucose: 85 mg/dL (ref 70–99)
Potassium: 3.8 mmol/L (ref 3.5–5.2)
Sodium: 140 mmol/L (ref 134–144)
eGFR: 98 mL/min/1.73 (ref >59)

## 2024-01-16 LAB — MICROALBUMIN / CREATININE URINE RATIO
Creatinine, Urine: 70.9 mg/dL
Microalb/Creat Ratio: 4 mg/g{creat} (ref 0–29)
Microalbumin, Urine: 3 ug/mL

## 2024-01-17 ENCOUNTER — Ambulatory Visit: Payer: Self-pay | Admitting: Family Medicine

## 2024-02-13 NOTE — Progress Notes (Unsigned)
   SUBJECTIVE:   CHIEF COMPLAINT / HPI:  Leslie Martinez is a 58 y.o. female with a pertinent past medical history of T2DM, HLD, HTN, Barrett's esophagus s/p lap Nissen presenting to the clinic for HTN and T2DM follow up.  Hypertension - BP: (!) 166/85 today. - Home medications include: Olmesartan -hydrochlorothiazide  40-25 mg. - Amlodipine  gave her a headache, lisinopril caused a cough, does not want spirolactone - She endorses taking these medications as prescribed. - Does check blood pressure at home in PM, takes meds at 10 AM, these have ranged 140-160/70-85 on log, see media tab scan 02/14/24. - Denies hypotension symptoms including dizziness and orthostatic symptoms. - Denies headache, vision changes, LUQ pain, hematuria, chest pain. - Diet: Does eat a lot of salty foods  Most recent creatinine trend:  Lab Results  Component Value Date   CREATININE 0.71 01/15/2024   CREATININE 0.74 03/14/2022   CREATININE 0.65 10/14/2020   Type 2 diabetes mellitus - Prior A1c 6.5 on 01/15/24 - Home CBGs: Not checking - Medications: Semaglutide  0.25 mg restarted 10/13   - Tolerating well, open to increasing today - Eye exam: DUE - Foot exam: UTD - Microalbumin: UTD - Statin: Atorvastatin  10 mg restarted 10/13, history of myalgias, patient reports no side effects now - No symptoms of hypoglycemia, polyuria, polydipsia, numbness of extremities, foot ulcers/trauma   PERTINENT PMH / PSH: Barrett's esophagus on omeprazole  T2DM, HLD, HTN, obesity Asthma  *Remainder reviewed in problem list.   OBJECTIVE:   BP (!) 166/85   Pulse 65   Ht 5' 9 (1.753 m)   Wt 239 lb 3.2 oz (108.5 kg)   SpO2 100%   BMI 35.32 kg/m   General: Age-appropriate, resting comfortably in chair, NAD, alert and at baseline. Cardiovascular: Regular rate and rhythm. Normal S1/S2. No murmurs, rubs, or gallops appreciated. 2+ radial pulses. Pulmonary: Clear bilaterally to ascultation. No wheezes, crackles, or rhonchi.  Normal WOB on room air. No accessory muscle use. Skin: Warm and dry. Extremities: No peripheral edema bilaterally. Capillary refill <2 seconds.   ASSESSMENT/PLAN:   Assessment & Plan Essential hypertension BP not at goal today. - Change from Benicar  to Tribenzor (olmesartan -amlodipine -HCTZ 40-10-25) with addition of amlodipine  10 mg - Follow up in 2-3 months with BP log again Type 2 diabetes mellitus without complication, without long-term current use of insulin  (HCC) Tolerating semaglutide  well.  A1c well controlled last visit 1 month ago. - Increase semaglutide  to 0.5 mg weekly Encounter for screening mammogram for malignant neoplasm of breast - Mammogram ordered, GI breast center number provided Health care maintenance - Influenza and PCV 20 vaccines were declined today  No follow-ups on file.  [name], MD [facility]

## 2024-02-13 NOTE — Assessment & Plan Note (Signed)
 Tolerating semaglutide  well.  A1c well controlled last visit 1 month ago. - Increase semaglutide  to 0.5 mg weekly

## 2024-02-14 ENCOUNTER — Ambulatory Visit: Payer: Self-pay | Admitting: Family Medicine

## 2024-02-14 VITALS — BP 166/85 | HR 65 | Ht 69.0 in | Wt 239.2 lb

## 2024-02-14 DIAGNOSIS — Z0000 Encounter for general adult medical examination without abnormal findings: Secondary | ICD-10-CM

## 2024-02-14 DIAGNOSIS — E119 Type 2 diabetes mellitus without complications: Secondary | ICD-10-CM

## 2024-02-14 DIAGNOSIS — Z1231 Encounter for screening mammogram for malignant neoplasm of breast: Secondary | ICD-10-CM

## 2024-02-14 DIAGNOSIS — I10 Essential (primary) hypertension: Secondary | ICD-10-CM

## 2024-02-14 MED ORDER — SEMAGLUTIDE(0.25 OR 0.5MG/DOS) 2 MG/1.5ML ~~LOC~~ SOPN
0.5000 mg | PEN_INJECTOR | SUBCUTANEOUS | Status: AC
Start: 2024-02-14 — End: ?

## 2024-02-14 MED ORDER — OLMESARTAN-AMLODIPINE-HCTZ 40-10-25 MG PO TABS: 1.0000 | ORAL_TABLET | Freq: Every day | ORAL | 3 refills | Status: AC

## 2024-02-14 NOTE — Patient Instructions (Addendum)
 It was great to see you today! Thank you for choosing [facility] for your primary care.  Today we addressed: 1. Essential hypertension We are changing you to Tribenzor today, please let me know if you have any issues with this. Please keep up your BP log.  2. Type 2 diabetes mellitus without complication, without long-term current use of insulin  (HCC) Please increase to 0.5 mg of semaglutide  daily, we can increase again in 1 month if you are tolerating it well.  3. Mammogram You need a mammogram to prevent breast cancer.  Please schedule an appointment.  You can call [phone].  If you had a referral placed, they will call you to set up an appointment. Please give us  a call if you don't hear back in the next 2 weeks.  You should return to our clinic in 2-3 months to check you BP with the log.  Thank you for coming to see us  at [facility] and for the opportunity to care for you! Leslie Martinez, Adanely Reynoso, MD 02/14/2024, 9:17 AM

## 2024-02-14 NOTE — Assessment & Plan Note (Signed)
 BP not at goal today. - Change from Benicar  to Tribenzor (olmesartan -amlodipine -HCTZ 40-10-25) with addition of amlodipine  10 mg - Follow up in 2-3 months with BP log again

## 2024-02-18 ENCOUNTER — Other Ambulatory Visit: Payer: Self-pay | Admitting: Family Medicine

## 2024-02-18 DIAGNOSIS — E119 Type 2 diabetes mellitus without complications: Secondary | ICD-10-CM

## 2024-04-01 ENCOUNTER — Ambulatory Visit

## 2024-04-18 ENCOUNTER — Ambulatory Visit: Admission: RE | Admit: 2024-04-18 | Discharge: 2024-04-18 | Disposition: A | Source: Ambulatory Visit

## 2024-04-18 DIAGNOSIS — Z1231 Encounter for screening mammogram for malignant neoplasm of breast: Secondary | ICD-10-CM

## 2024-04-21 ENCOUNTER — Other Ambulatory Visit: Payer: Self-pay | Admitting: Medical Genetics
